# Patient Record
Sex: Male | Born: 1940 | Race: White | Hispanic: No | Marital: Married | Smoking: Former smoker
Health system: Southern US, Community
[De-identification: ages and names within clinical notes are randomized; demographics above are authoritative.]

## PROBLEM LIST (undated history)

## (undated) DIAGNOSIS — K08109 Complete loss of teeth, unspecified cause, unspecified class: Secondary | ICD-10-CM

## (undated) DIAGNOSIS — M5430 Sciatica, unspecified side: Secondary | ICD-10-CM

## (undated) DIAGNOSIS — Z973 Presence of spectacles and contact lenses: Secondary | ICD-10-CM

## (undated) DIAGNOSIS — N400 Enlarged prostate without lower urinary tract symptoms: Secondary | ICD-10-CM

## (undated) DIAGNOSIS — M1990 Unspecified osteoarthritis, unspecified site: Secondary | ICD-10-CM

## (undated) DIAGNOSIS — Z972 Presence of dental prosthetic device (complete) (partial): Secondary | ICD-10-CM

## (undated) DIAGNOSIS — Z87442 Personal history of urinary calculi: Secondary | ICD-10-CM

## (undated) DIAGNOSIS — I10 Essential (primary) hypertension: Secondary | ICD-10-CM

## (undated) DIAGNOSIS — R399 Unspecified symptoms and signs involving the genitourinary system: Secondary | ICD-10-CM

## (undated) DIAGNOSIS — I2510 Atherosclerotic heart disease of native coronary artery without angina pectoris: Secondary | ICD-10-CM

## (undated) DIAGNOSIS — K635 Polyp of colon: Secondary | ICD-10-CM

## (undated) DIAGNOSIS — E785 Hyperlipidemia, unspecified: Secondary | ICD-10-CM

## (undated) DIAGNOSIS — Z860100 Personal history of colon polyps, unspecified: Secondary | ICD-10-CM

## (undated) HISTORY — PX: APPENDECTOMY: SHX54

## (undated) HISTORY — DX: Unspecified osteoarthritis, unspecified site: M19.90

## (undated) HISTORY — PX: CHOLECYSTECTOMY: SHX55

## (undated) HISTORY — DX: Atherosclerotic heart disease of native coronary artery without angina pectoris: I25.10

## (undated) HISTORY — PX: SHOULDER SURGERY: SHX246

## (undated) HISTORY — DX: Sciatica, unspecified side: M54.30

## (undated) HISTORY — DX: Essential (primary) hypertension: I10

## (undated) HISTORY — DX: Hyperlipidemia, unspecified: E78.5

## (undated) HISTORY — PX: LUMBAR SPINE SURGERY: SHX701

## (undated) HISTORY — PX: CARDIOVASCULAR STRESS TEST: SHX262

## (undated) HISTORY — DX: Polyp of colon: K63.5

---

## 1998-02-24 ENCOUNTER — Ambulatory Visit (HOSPITAL_COMMUNITY): Admission: RE | Admit: 1998-02-24 | Discharge: 1998-02-24 | Payer: Self-pay | Admitting: Urology

## 1998-04-01 ENCOUNTER — Ambulatory Visit (HOSPITAL_COMMUNITY): Admission: RE | Admit: 1998-04-01 | Discharge: 1998-04-01 | Payer: Self-pay | Admitting: Family Medicine

## 1999-12-28 ENCOUNTER — Other Ambulatory Visit: Admission: RE | Admit: 1999-12-28 | Discharge: 1999-12-28 | Payer: Self-pay | Admitting: Family Medicine

## 2000-01-02 ENCOUNTER — Ambulatory Visit (HOSPITAL_COMMUNITY): Admission: RE | Admit: 2000-01-02 | Discharge: 2000-01-02 | Payer: Self-pay | Admitting: Family Medicine

## 2000-01-02 ENCOUNTER — Encounter: Payer: Self-pay | Admitting: Family Medicine

## 2002-12-11 ENCOUNTER — Encounter: Admission: RE | Admit: 2002-12-11 | Discharge: 2002-12-11 | Payer: Self-pay | Admitting: Family Medicine

## 2002-12-11 ENCOUNTER — Encounter: Payer: Self-pay | Admitting: Family Medicine

## 2002-12-22 ENCOUNTER — Encounter: Payer: Self-pay | Admitting: Neurosurgery

## 2002-12-22 ENCOUNTER — Inpatient Hospital Stay (HOSPITAL_COMMUNITY): Admission: RE | Admit: 2002-12-22 | Discharge: 2002-12-24 | Payer: Self-pay | Admitting: Neurosurgery

## 2003-07-16 ENCOUNTER — Encounter: Admission: RE | Admit: 2003-07-16 | Discharge: 2003-07-16 | Payer: Self-pay | Admitting: Family Medicine

## 2003-07-16 ENCOUNTER — Encounter: Payer: Self-pay | Admitting: Family Medicine

## 2003-12-15 ENCOUNTER — Ambulatory Visit (HOSPITAL_COMMUNITY): Admission: RE | Admit: 2003-12-15 | Discharge: 2003-12-15 | Payer: Self-pay | Admitting: Otolaryngology

## 2004-09-20 ENCOUNTER — Encounter (INDEPENDENT_AMBULATORY_CARE_PROVIDER_SITE_OTHER): Payer: Self-pay | Admitting: *Deleted

## 2004-09-20 ENCOUNTER — Ambulatory Visit (HOSPITAL_COMMUNITY): Admission: RE | Admit: 2004-09-20 | Discharge: 2004-09-20 | Payer: Self-pay | Admitting: *Deleted

## 2006-10-22 ENCOUNTER — Encounter: Admission: RE | Admit: 2006-10-22 | Discharge: 2007-01-20 | Payer: Self-pay | Admitting: Family Medicine

## 2007-02-19 ENCOUNTER — Encounter (INDEPENDENT_AMBULATORY_CARE_PROVIDER_SITE_OTHER): Payer: Self-pay | Admitting: Specialist

## 2007-02-19 ENCOUNTER — Ambulatory Visit (HOSPITAL_COMMUNITY): Admission: RE | Admit: 2007-02-19 | Discharge: 2007-02-19 | Payer: Self-pay | Admitting: Surgery

## 2007-09-29 ENCOUNTER — Encounter: Admission: RE | Admit: 2007-09-29 | Discharge: 2007-10-22 | Payer: Self-pay | Admitting: Family Medicine

## 2007-10-23 ENCOUNTER — Encounter: Admission: RE | Admit: 2007-10-23 | Discharge: 2007-11-20 | Payer: Self-pay | Admitting: Family Medicine

## 2009-03-27 ENCOUNTER — Encounter: Admission: RE | Admit: 2009-03-27 | Discharge: 2009-03-27 | Payer: Self-pay | Admitting: Family Medicine

## 2009-04-11 ENCOUNTER — Encounter: Admission: RE | Admit: 2009-04-11 | Discharge: 2009-05-12 | Payer: Self-pay | Admitting: Family Medicine

## 2009-05-24 ENCOUNTER — Inpatient Hospital Stay (HOSPITAL_COMMUNITY): Admission: RE | Admit: 2009-05-24 | Discharge: 2009-05-27 | Payer: Self-pay | Admitting: Neurosurgery

## 2009-05-24 HISTORY — PX: ANTERIOR CERVICAL DECOMP/DISCECTOMY FUSION: SHX1161

## 2009-11-07 ENCOUNTER — Encounter: Admission: RE | Admit: 2009-11-07 | Discharge: 2010-02-05 | Payer: Self-pay | Admitting: Orthopedic Surgery

## 2011-01-28 LAB — CBC
MCHC: 34.7 g/dL (ref 30.0–36.0)
MCV: 88.5 fL (ref 78.0–100.0)
Platelets: 194 10*3/uL (ref 150–400)
RBC: 5.44 MIL/uL (ref 4.22–5.81)
RDW: 13.5 % (ref 11.5–15.5)

## 2011-03-06 NOTE — Discharge Summary (Signed)
[name], William [name] NO.:  [id number]   MEDICAL RECORD NO.:  [id number]          PATIENT TYPE:  INP   LOCATION:  3004                         FACILITY:  [facility]   PHYSICIAN:  Hilda Lias, M.D.   DATE OF BIRTH:  25-Mar-1941   DATE OF ADMISSION:  05/24/2009  DATE OF DISCHARGE:  05/27/2009                               DISCHARGE SUMMARY   ADMISSION DIAGNOSIS:  Cervical stenosis with chronic radiculopathy C3-  C7.   FINAL DIAGNOSIS:  Cervical stenosis with chronic radiculopathy C3-C7.   CLINICAL HISTORY:  The patient was admitted because of pain and weakness  in both upper extremities.  X-ray shows severe stenosis from C3-C7.  Surgery was advised.  Laboratory normal.   COURSE IN THE HOSPITAL:  The patient was taken to surgery on August 3,  20l0, and decompression and fusion from C3-C7 was done.  Today, he is  doing great.  He is walking have no complaint.  He is being discharged  today.   CONDITION ON DISCHARGE:  Stable.   MEDICATION:  Percocet and diazepam.   DIET:  Regular.   ACTIVITY:  Not to drive for at least a week.   FOLLOWUP:  To be seen by me in 4 weeks.           ______________________________  Hilda Lias, M.D.     EB/MEDQ  D:  05/27/2009  T:  05/27/2009  Job:  [id number]

## 2011-03-06 NOTE — Op Note (Signed)
NAMENAYDEN, CZAJKA NO.:  [id number]   MEDICAL RECORD NO.:  [id number]          PATIENT TYPE:  INP   LOCATION:  3004                         FACILITY:  [facility]   PHYSICIAN:  Hilda Lias, M.D.   DATE OF BIRTH:  1941/07/05   DATE OF PROCEDURE:  05/24/2009  DATE OF DISCHARGE:                               OPERATIVE REPORT   PREOPERATIVE DIAGNOSIS:  C3-4, C4-5, C5-6 and C6-7 cervical stenosis  with chronic radiculopathy.   POSTOPERATIVE DIAGNOSIS:  C3-4, C4-5, C5-6 and C6-7 cervical stenosis  with chronic radiculopathy.   PROCEDURE:  Anterior C3-4, C4-5, C5-6 and C6-7 diskectomy interbody  fusion with auto and allograft plate, microscope.   SURGEON:  Hilda Lias, MD   ASSISTANT:  Cristi Loron, MD   CLINICAL HISTORY:  Mr. Burkett is a gentleman who is being admitted  because of chronic neck pain worse to both upper extremity, the left  worse than the right one.  By x-ray he has severe stenosis all the way  from C3 down to C7.  He also has a history of bursitis of shoulder.  Surgery was advised.   PROCEDURE:  The patient was taken to the OR and after intubation the  left side of the neck was cleaned with DuraPrep.  Longitudinal incision  through the skin, platysma was carried out.  We did our first x-ray  which showed that indeed we were at the level C4-5.  We started our  dissection at the level C4-5 first, the reason is because there was  almost no space whatsoever.  We had to drill the anterior ligament  because it was calcified and we worked our way using micro curettes.  Dissection was carried all the way down using mostly a drill until we  were able to reach the posterior ligament.  The posterior ligament was  opened and decompression of the spinal cord as well as C5 nerve root was  done.  At the level C4, we had the same finding but there was some kind  of soft disk.  Diskectomy was accomplished with good decompression of  the cord and the C4  nerve root.  At the level C5-6, the patient had  quite a bit of degenerative disk disease.  The diskectomy was  accomplished with decompression of the spinal cord as well as the canal.  At the level C6-7 most of the tissue was herniated disk with a posterior  calcification.  After removal of the disk, the calcified posterior  ligament was also excised and then with the Kerrison punch decompression  of the spinal cord as well as the C7 nerve root was achieved.  At the  end, we had good decompression with some lordosis almost back to normal  position.  Then at the level C3-4 and C4-5 we introduced allograft of 6  mm with autograft inside.  At the level C5-6, C6-7 the allograft graft  was of 7 mm and also lordotic.  This was followed by a plate from Z6-X0  using pin screws.  Lateral cervical spine showed good position of the  plate and the  crus mostly at the level C3-4 and C4-5, we were unable to  see below.  Although we achieved good hemostasis nevertheless, we left  our drain behind.  The wound was closed with Vicryl and Steri-Strips.           ______________________________  Hilda Lias, M.D.    EB/MEDQ  D:  05/24/2009  T:  05/25/2009  Job:  [id number]

## 2011-03-09 NOTE — H&P (Signed)
NAME:  William [name], REASON NO.:  [id number]   MEDICAL RECORD NO.:  [id number]                   PATIENT TYPE:  INP   LOCATION:  2899                                 FACILITY:  [facility]   PHYSICIAN:  Hilda Lias, M.D.                DATE OF BIRTH:  Mar 20, 1941   DATE OF ADMISSION:  12/22/2002  DATE OF DISCHARGE:                                HISTORY & PHYSICAL   HISTORY OF PRESENT ILLNESS:  William [name] is a gentleman who was seen by me in  my office around a week ago because of sudden onset of back pain radiating  down to the groin area.  The pain was going into the groin and not below  that.  At the beginning his physician felt that he might have a kidney stone  and a CT scan was negative.  From then on, he had an MRI and because of the  findings, he was seen by [place]. We saw him. We continued with the conservative  treatment, but he came yesterday to the emergency room and it was worse.  Because of that, he is being admitted today for surgery.  He denies any pain  in the left leg.  Initially, patient came to my office and he was limping on  the right leg.   PAST MEDICAL HISTORY:  Negative.   REVIEW OF SYSTEMS:  Possible back and right thigh pain.   ALLERGIES:  Not allergic to any medications.   SOCIAL HISTORY:  Negative.   FAMILY HISTORY:  Negative.   PHYSICAL EXAMINATION:  HEENT:  Normal.  NECK:  Normal.  LUNGS:  Clear.  HEART:  Heart sounds normal, no murmurs. Good pedal pulses.  NEUROLOGIC:  Mental status normal.  Cranial nerves normal.  Strength in the  right leg at ilial psoas and the abduction and adduction.  He has normal  strength in the right foot.  Sensation is normal.  Reflexes symmetrical with  the right knee jerk which is present but decreased in relation to the left  one.   The MRI showed indeed that he has a herniated disk at the level of T12-L1.  This with displacement of the thecal sac as well as the L1 root.  There is  present  right L1 radiculopathy secondary to __________ circulation with the  patient being admitted for surgery.   PROCEDURE:  The procedure will be T12-L1 diskectomy and we are going to be  using far lateral lamina approach or facetectomy and/or a transpedicle  approach.  We are going to be using the C-arm during the procedure to  visualize the area and make more space lateral to avoid retracting the  spinal cord.   I talked to the patient, discussed this at length and showed him the spinal  cord model and I told him that this type of surgery shall be deferred from  the normal routine lumbar diskectomy.  The cord was medialized right there  while  the complication of the surgery would be weakness of the bladder or bowel  which may be temporary or may be permanent.  Also, the risks of the surgery  itself such as infection, CSF leak, and need for surgery includes fusion.  The patient declined another opinion.                                                Hilda Lias, M.D.    EB/MEDQ  D:  12/22/2002  T:  12/22/2002  Job:  [id number]

## 2011-11-15 ENCOUNTER — Other Ambulatory Visit: Payer: Self-pay | Admitting: Family Medicine

## 2012-12-31 ENCOUNTER — Observation Stay (HOSPITAL_COMMUNITY)
Admission: EM | Admit: 2012-12-31 | Discharge: 2013-01-02 | Disposition: A | Discharge status: Home / Self Care | Payer: Medicare Other | Attending: Interventional Cardiology | Admitting: Interventional Cardiology

## 2012-12-31 ENCOUNTER — Emergency Department (HOSPITAL_COMMUNITY): Payer: Medicare Other

## 2012-12-31 ENCOUNTER — Inpatient Hospital Stay (HOSPITAL_COMMUNITY): Admission: AD | Admit: 2012-12-31 | Payer: Self-pay | Source: Ambulatory Visit | Admitting: Interventional Cardiology

## 2012-12-31 ENCOUNTER — Encounter (HOSPITAL_COMMUNITY): Payer: Self-pay | Admitting: *Deleted

## 2012-12-31 DIAGNOSIS — R0609 Other forms of dyspnea: Secondary | ICD-10-CM | POA: Insufficient documentation

## 2012-12-31 DIAGNOSIS — I2510 Atherosclerotic heart disease of native coronary artery without angina pectoris: Principal | ICD-10-CM | POA: Insufficient documentation

## 2012-12-31 DIAGNOSIS — R030 Elevated blood-pressure reading, without diagnosis of hypertension: Secondary | ICD-10-CM | POA: Insufficient documentation

## 2012-12-31 DIAGNOSIS — I200 Unstable angina: Secondary | ICD-10-CM | POA: Insufficient documentation

## 2012-12-31 DIAGNOSIS — E785 Hyperlipidemia, unspecified: Secondary | ICD-10-CM | POA: Insufficient documentation

## 2012-12-31 DIAGNOSIS — R0989 Other specified symptoms and signs involving the circulatory and respiratory systems: Secondary | ICD-10-CM | POA: Insufficient documentation

## 2012-12-31 LAB — HEMOGLOBIN A1C
Hgb A1c MFr Bld: 5.9 % — ABNORMAL HIGH (ref <5.7)
Mean Plasma Glucose: 123 mg/dL — ABNORMAL HIGH (ref <117)

## 2012-12-31 LAB — CBC WITH DIFFERENTIAL/PLATELET
Basophils Absolute: 0.1 10*3/uL (ref 0.0–0.1)
Eosinophils Absolute: 0.1 10*3/uL (ref 0.0–0.7)
Eosinophils Relative: 2 % (ref 0–5)
HCT: 43.9 % (ref 39.0–52.0)
MCH: 30.4 pg (ref 26.0–34.0)
MCHC: 34.2 g/dL (ref 30.0–36.0)
MCV: 88.9 fL (ref 78.0–100.0)
Monocytes Absolute: 0.5 10*3/uL (ref 0.1–1.0)
Platelets: 152 10*3/uL (ref 150–400)
RDW: 13.3 % (ref 11.5–15.5)

## 2012-12-31 LAB — MRSA PCR SCREENING: MRSA by PCR: NEGATIVE

## 2012-12-31 LAB — COMPREHENSIVE METABOLIC PANEL
ALT: 26 U/L (ref 0–53)
AST: 24 U/L (ref 0–37)
CO2: 24 mEq/L (ref 19–32)
Calcium: 8.9 mg/dL (ref 8.4–10.5)
Creatinine, Ser: 1.12 mg/dL (ref 0.50–1.35)
GFR calc Af Amer: 74 mL/min — ABNORMAL LOW (ref >90)
GFR calc non Af Amer: 64 mL/min — ABNORMAL LOW (ref >90)
Sodium: 141 mEq/L (ref 135–145)
Total Protein: 6.8 g/dL (ref 6.0–8.3)

## 2012-12-31 LAB — POCT I-STAT, CHEM 8
BUN: 20 mg/dL (ref 6–23)
Chloride: 110 mEq/L (ref 96–112)
Creatinine, Ser: 1 mg/dL (ref 0.50–1.35)
Glucose, Bld: 108 mg/dL — ABNORMAL HIGH (ref 70–99)
Hemoglobin: 15.3 g/dL (ref 13.0–17.0)
Potassium: 4 mEq/L (ref 3.5–5.1)
Sodium: 142 mEq/L (ref 135–145)

## 2012-12-31 LAB — CBC
MCHC: 34.7 g/dL (ref 30.0–36.0)
Platelets: 160 10*3/uL (ref 150–400)
RDW: 13.3 % (ref 11.5–15.5)
WBC: 7.1 10*3/uL (ref 4.0–10.5)

## 2012-12-31 LAB — PROTIME-INR: INR: 1 (ref 0.00–1.49)

## 2012-12-31 LAB — POCT I-STAT TROPONIN I

## 2012-12-31 LAB — TROPONIN I: Troponin I: <0.3 ng/mL (ref <0.30)

## 2012-12-31 LAB — APTT: aPTT: 35 seconds (ref 24–37)

## 2012-12-31 MED ORDER — ATORVASTATIN CALCIUM 80 MG PO TABS
80.0000 mg | ORAL_TABLET | Freq: Every day | ORAL | Status: DC
  Administered 2012-12-31: 80 mg via ORAL
  Filled 2012-12-31 (×2): qty 1

## 2012-12-31 MED ORDER — SODIUM CHLORIDE 0.9 % IJ SOLN
3.0000 mL | Freq: Two times a day (BID) | INTRAMUSCULAR | Status: DC
  Administered 2012-12-31: 3 mL via INTRAVENOUS

## 2012-12-31 MED ORDER — HEPARIN BOLUS VIA INFUSION
4000.0000 [IU] | Freq: Once | INTRAVENOUS | Status: AC
  Administered 2012-12-31: 4000 [IU] via INTRAVENOUS

## 2012-12-31 MED ORDER — NITROGLYCERIN IN D5W 200-5 MCG/ML-% IV SOLN
10.0000 ug/min | INTRAVENOUS | Status: DC
  Administered 2012-12-31: 10 ug/min via INTRAVENOUS
  Filled 2012-12-31: qty 250

## 2012-12-31 MED ORDER — METOPROLOL TARTRATE 25 MG PO TABS
25.0000 mg | ORAL_TABLET | Freq: Two times a day (BID) | ORAL | Status: DC
  Administered 2012-12-31: 25 mg via ORAL
  Filled 2012-12-31 (×3): qty 1

## 2012-12-31 MED ORDER — ASPIRIN 300 MG RE SUPP
300.0000 mg | RECTAL | Status: AC
  Filled 2012-12-31: qty 1

## 2012-12-31 MED ORDER — SODIUM CHLORIDE 0.9 % IV SOLN: 250.0000 mL | INTRAVENOUS | Status: DC | PRN

## 2012-12-31 MED ORDER — ASPIRIN 81 MG PO CHEW
324.0000 mg | CHEWABLE_TABLET | ORAL | Status: AC
  Administered 2013-01-01: 324 mg via ORAL
  Filled 2012-12-31: qty 4

## 2012-12-31 MED ORDER — ONDANSETRON HCL 4 MG/2ML IJ SOLN: 4.0000 mg | Freq: Four times a day (QID) | INTRAMUSCULAR | Status: DC | PRN

## 2012-12-31 MED ORDER — DIAZEPAM 5 MG PO TABS
5.0000 mg | ORAL_TABLET | ORAL | Status: AC
  Administered 2013-01-01: 5 mg via ORAL
  Filled 2012-12-31: qty 1

## 2012-12-31 MED ORDER — NITROGLYCERIN 0.4 MG SL SUBL: 0.4000 mg | SUBLINGUAL_TABLET | SUBLINGUAL | Status: DC | PRN

## 2012-12-31 MED ORDER — ACETAMINOPHEN 325 MG PO TABS
650.0000 mg | ORAL_TABLET | ORAL | Status: DC | PRN
  Administered 2012-12-31: 650 mg via ORAL
  Filled 2012-12-31: qty 2

## 2012-12-31 MED ORDER — SODIUM CHLORIDE 0.9 % IJ SOLN: 3.0000 mL | INTRAMUSCULAR | Status: DC | PRN

## 2012-12-31 MED ORDER — SODIUM CHLORIDE 0.9 % IV SOLN
INTRAVENOUS | Status: DC
  Administered 2012-12-31: 17:00:00 via INTRAVENOUS

## 2012-12-31 MED ORDER — ASPIRIN EC 81 MG PO TBEC
81.0000 mg | DELAYED_RELEASE_TABLET | Freq: Every day | ORAL | Status: DC
  Filled 2012-12-31: qty 1

## 2012-12-31 MED ORDER — SODIUM CHLORIDE 0.9 % IV SOLN: INTRAVENOUS | Status: DC

## 2012-12-31 MED ORDER — HEPARIN (PORCINE) IN NACL 100-0.45 UNIT/ML-% IJ SOLN
1650.0000 [IU]/h | INTRAMUSCULAR | Status: DC
  Administered 2012-12-31: 1200 [IU]/h via INTRAVENOUS
  Filled 2012-12-31 (×3): qty 250

## 2012-12-31 NOTE — Care Management Note (Signed)
    Page 1 of 1   12/31/2012     4:27:50 PM   CARE MANAGEMENT NOTE 12/31/2012  Patient:  AKSEL, BENCOMO   Account Number:  [id number]  Date Initiated:  12/31/2012  Documentation initiated by:  Junius Creamer  Subjective/Objective Assessment:   adm w ch pain     Action/Plan:   lives w wife, pcp dr Donovan Kail   Anticipated DC Date:     Anticipated DC Plan:        DC Planning Services  CM consult      Choice offered to / List presented to:             Status of service:   Medicare Important Message given?   (If response is "NO", the following Medicare IM given date fields will be blank) Date Medicare IM given:   Date Additional Medicare IM given:    Discharge Disposition:    Per UR Regulation:  Reviewed for med. necessity/level of care/duration of stay  If discussed at Long Length of Stay Meetings, dates discussed:    Comments:  3/12 1627 debbie dowell rn,bsn

## 2012-12-31 NOTE — ED Notes (Signed)
Dr Garnette Scheuermann of [facility]  [phone] called and stated he has already placed orders for nitro/heparin and for cath lab.  Dr Anitra Lauth to be notified.

## 2012-12-31 NOTE — H&P (Signed)
HPI:  General:  [name] is a 72 year old gentleman with a 6 week history of exertional chest tightness and dyspnea. Very little physical activity in his yard or walking any distance causes his chest to become tight and he feels as though he is going to give out. He complained to Dr. Tenny Craw about this today and was referred to cardiology for further evaluation. In speaking with the patient, he states that he has had very mild recurrences of this discomfort at rest. Last night was an example of his chest feeling mildly heavy even though he was lying inclined to go to sleep. Because he has continued to have this difficulty he went in to see Dr. Tenny Craw today. Greater than 20 years ago yeah coronary angiography that did not reveal any significant abnormality..       ROS:  CONSTITUTIONAL:  Patient denies chills, fatigue, fever, insomnia, night sweats, and anorexia.  HEENT:  Patient denies change in vision , double vision , loss of smell , loss of hearing , bleeding gums.  CARDIOLOGY:  no Chest tightness. no Claudication. no Cyanosis. no Dyspnea on exertion. no Edema. no Fatigue. no Irregular heart beat. no Murmurs. no Near Syncope. no Orthopnea. no Palpitations. no PND (paroxsymal nocturnal dyspnea). Signs of GI bleeding None. Snoring and/or insomnia None. Syncope no. Transient neurological symptoms None. Visual changes none.  MUSCULOSKELETAL:  Patient denies back pain, carpal tunnel, joint stiffness, joint swelling.  NEUROLOGY:  Patient denies headache, insomnia, confusion, gait abnormality, paralysis, paresthesias, seizures, transient neurologiacal deficits..  PSYCHOLOGY:  Patient denies anxiety, mania, memory loss, nervousness, nightmares .         Medical History: Osteoarthritis, Lumbar disc disease, Hyperlipidemia.        Surgical History: shoulder surgery , back surgery X 2 , appendectomy .        Family History: Father: deceased 61 yrs, dementia. Myocardial infarction in his 43s.Mother:  deceased 63 yrs, lung problemsSister 1: alive 31 yrs, back problems and shoulder problems 1B, 2G 7Gd, 1 Gs, 1Ggs.       Social History:  General:  History of smoking  cigarettes: Former smoker no Smoking.  no Alcohol.  Caffeine: yes.  no Recreational drug use.  Exercise: yes, very active.  Occupation: unemployed, retired.  Marital Status: married.  Children: Boys, 1, girls, 2.        Medications: Taking Flomax 0.4 MG Capsule 1 capsule 30 minutes after the same meal each day Once a day       Allergies: Codeine (for allergy).           Vitals: Pulse sitting 92, BP sitting 155/92.       Examination:  Cardiology Exam:  GENERAL APPEARANCE: pleasant, NAD, comfortable, obese, elderly, male.  HEENT: normal.  CAROTID UPSTROKE: no bruit, upstrokes intact.  JVD: flat.  HEART: regular rate and rhythm, normal S1S2, no rub, or click. S4 gallop.Marland Kitchen  HEART MURMUR: none.  LUNGS: clear to auscultation, no wheezing/rhonchi/rales.  ABDOMEN: soft, non-tender, no hepatomegaly, no masses palpated.  EXTREMITIES: no leg edema.  PERIPHERAL PULSES: 2+, bilateral.  NEUROLOGIC: grossly intact, cranial nerves intact, gait WNL.  MOOD: normal.            Assessment:  1. 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  2. Hyperlipidemia - 272.4, no therapy  3. Elevated blood pressure reading without diagnosis of hypertension - 796.2, Could have hypertension blood pressure may be elevated because of  stress of the current situation        1. Intermediate coronary syndrome  Notes: ADMITTED to the hospital IV nitroglycerin Beta blocker therapy IV heparin Plan coronary angiography with possible PCI and a.m. Procedure risk including stroke, death, myocardial infarction, allergy, limb ischemia, bleeding, among others were discussed in detail with the patient and wife. They've both had previous coronary angiograms and understand  the procedure.       Provider: Verdis Prime, MD  Patient: William [name], William [name] DOB: [birth year] Date: 12/31/2012

## 2012-12-31 NOTE — Progress Notes (Signed)
ANTICOAGULATION CONSULT NOTE - Initial Consult  Pharmacy Consult for heparin Indication: chest pain/ACS  Allergies  Allergen Reactions  . Codeine Other (See Comments)    "deathly sick"    Patient Measurements: Height: 5\' 9"  (175.3 cm) Weight: 228 lb (103.42 kg) IBW/kg (Calculated) : 70.7 Heparin Dosing Weight: 93kg  Vital Signs: Temp: 98.1 F (36.7 C) (03/12 1433) Temp src: Oral (03/12 1433) BP: 122/79 mmHg (03/12 1538) Pulse Rate: 87 (03/12 1538)  Labs:  Recent Labs  12/31/12 1450 12/31/12 1515  HGB 15.5 15.3  HCT 44.7 45.0  PLT 160  --   CREATININE  --  1.00    Estimated Creatinine Clearance: 79.1 ml/min (by C-G formula based on Cr of 1).   Medical History: History reviewed. No pertinent past medical history.  Medications:  Infusions:  . heparin    . heparin    . nitroGLYCERIN 15 mcg/min (12/31/12 1538)    Assessment: 71 yom presented with CP to start IV heparin. Baseline CBC is WNL. He was not on any anticoagulants PTA.  Goal of Therapy:  Heparin level 0.3-0.7 units/ml Monitor platelets by anticoagulation protocol: Yes   Plan:  1. Heparin bolus 4000 units IV x 1 2. Heparin gtt 1200 units/hr 3. Check an 8 hour heparin level 4. Daily heparin level and CBC  Rumbarger, Drake Leach 12/31/2012,3:44 PM

## 2012-12-31 NOTE — ED Notes (Signed)
Intermittent chest pain x 3 weeks which increased the past 2 days.  Went to see pcp and became diaphoretic, nauseated and dizzy.  Per EMS no ST elevations.  2 nitro given with relief from 8/10 to 4/10.  324 mg asa given.  AO x 4.

## 2013-01-01 ENCOUNTER — Encounter (HOSPITAL_COMMUNITY)
Admission: EM | Disposition: A | Discharge status: Home / Self Care | Payer: Self-pay | Source: Home / Self Care | Attending: Interventional Cardiology

## 2013-01-01 HISTORY — PX: LEFT HEART CATHETERIZATION WITH CORONARY ANGIOGRAM: SHX5451

## 2013-01-01 LAB — CBC
HCT: 44.2 % (ref 39.0–52.0)
MCH: 30.3 pg (ref 26.0–34.0)
MCV: 86.5 fL (ref 78.0–100.0)
RBC: 5.11 MIL/uL (ref 4.22–5.81)
WBC: 9 10*3/uL (ref 4.0–10.5)

## 2013-01-01 LAB — LIPID PANEL
Cholesterol: 155 mg/dL (ref 0–200)
HDL: 29 mg/dL — ABNORMAL LOW (ref >39)
Total CHOL/HDL Ratio: 5.3 RATIO

## 2013-01-01 SURGERY — LEFT HEART CATHETERIZATION WITH CORONARY ANGIOGRAM
Anesthesia: LOCAL

## 2013-01-01 MED ORDER — HEPARIN SODIUM (PORCINE) 1000 UNIT/ML IJ SOLN
INTRAMUSCULAR | Status: AC
  Filled 2013-01-01: qty 1

## 2013-01-01 MED ORDER — SODIUM CHLORIDE 0.9 % IV SOLN
INTRAVENOUS | Status: AC
  Administered 2013-01-01: 11:00:00 via INTRAVENOUS

## 2013-01-01 MED ORDER — ATORVASTATIN CALCIUM 20 MG PO TABS
20.0000 mg | ORAL_TABLET | Freq: Every day | ORAL | Status: DC
  Administered 2013-01-01: 20:00:00 20 mg via ORAL
  Filled 2013-01-01 (×2): qty 1

## 2013-01-01 MED ORDER — NITROGLYCERIN 0.4 MG SL SUBL: 0.4000 mg | SUBLINGUAL_TABLET | SUBLINGUAL | Status: DC | PRN

## 2013-01-01 MED ORDER — FENTANYL CITRATE 0.05 MG/ML IJ SOLN
INTRAMUSCULAR | Status: AC
  Filled 2013-01-01: qty 2

## 2013-01-01 MED ORDER — HEPARIN BOLUS VIA INFUSION
2000.0000 [IU] | Freq: Once | INTRAVENOUS | Status: AC
  Administered 2013-01-01: 2000 [IU] via INTRAVENOUS
  Filled 2013-01-01: qty 2000

## 2013-01-01 MED ORDER — CLOPIDOGREL BISULFATE 75 MG PO TABS: 75.0000 mg | ORAL_TABLET | Freq: Every day | ORAL | Status: DC

## 2013-01-01 MED ORDER — VERAPAMIL HCL 2.5 MG/ML IV SOLN
INTRAVENOUS | Status: AC
  Filled 2013-01-01: qty 2

## 2013-01-01 MED ORDER — METOPROLOL TARTRATE 50 MG PO TABS
50.0000 mg | ORAL_TABLET | Freq: Two times a day (BID) | ORAL | Status: DC
  Administered 2013-01-01 – 2013-01-02 (×2): 50 mg via ORAL
  Filled 2013-01-01 (×3): qty 1

## 2013-01-01 MED ORDER — METOPROLOL TARTRATE 50 MG PO TABS: 50.0000 mg | ORAL_TABLET | Freq: Two times a day (BID) | ORAL | Status: DC

## 2013-01-01 MED ORDER — MIDAZOLAM HCL 2 MG/2ML IJ SOLN
INTRAMUSCULAR | Status: AC
  Filled 2013-01-01: qty 2

## 2013-01-01 MED ORDER — ATORVASTATIN CALCIUM 20 MG PO TABS: 20.0000 mg | ORAL_TABLET | Freq: Every day | ORAL | Status: DC

## 2013-01-01 MED ORDER — HEPARIN (PORCINE) IN NACL 2-0.9 UNIT/ML-% IJ SOLN
INTRAMUSCULAR | Status: AC
  Filled 2013-01-01: qty 1000

## 2013-01-01 MED ORDER — TAMSULOSIN HCL 0.4 MG PO CAPS: 0.4000 mg | ORAL_CAPSULE | Freq: Every day | ORAL | Status: DC

## 2013-01-01 MED ORDER — TAMSULOSIN HCL 0.4 MG PO CAPS
0.4000 mg | ORAL_CAPSULE | Freq: Every day | ORAL | Status: DC
  Filled 2013-01-01 (×2): qty 1

## 2013-01-01 MED ORDER — ASPIRIN 81 MG PO CHEW: 81.0000 mg | CHEWABLE_TABLET | Freq: Every day | ORAL | Status: DC

## 2013-01-01 MED ORDER — NITROGLYCERIN 1 MG/10 ML FOR IR/CATH LAB
INTRA_ARTERIAL | Status: AC
  Filled 2013-01-01: qty 10

## 2013-01-01 MED ORDER — ASPIRIN 81 MG PO CHEW
81.0000 mg | CHEWABLE_TABLET | Freq: Every day | ORAL | Status: DC
  Administered 2013-01-02: 10:00:00 81 mg via ORAL
  Filled 2013-01-01: qty 1

## 2013-01-01 MED ORDER — LIDOCAINE HCL (PF) 1 % IJ SOLN
INTRAMUSCULAR | Status: AC
  Filled 2013-01-01: qty 30

## 2013-01-01 MED ORDER — CLOPIDOGREL BISULFATE 75 MG PO TABS
75.0000 mg | ORAL_TABLET | Freq: Every day | ORAL | Status: DC
  Administered 2013-01-02: 10:00:00 75 mg via ORAL
  Filled 2013-01-01: qty 1

## 2013-01-01 NOTE — CV Procedure (Signed)
     Diagnostic Cardiac Catheterization Report  William [name] [facility]  72 y.o.  male 1940/12/14  Procedure Date: 01/01/2013 Referring Physician: Duane Lope, MD Primary Cardiologist: HWBSmith, III, MD   PROCEDURE:  Left heart catheterization with selective coronary angiography, left ventriculogram.  INDICATIONS:  Unstable angina/crescendo angina  The risks, benefits, and details of the procedure were explained to the patient.  The patient verbalized understanding and wanted to proceed.  Informed written consent was obtained.  PROCEDURE TECHNIQUE:  After Xylocaine anesthesia a 5 French sheath was placed in the right radial artery with a single anterior needle wall stick.   Coronary angiography was done using a 5 [place] A2 MP catheter.  Left ventriculography was done using a 5 [place] A2 MP catheter. Slowly used a #4 JR, and easy RCA, and AL-1 5 [place] guide to obtain better shots of the native right coronary. 400 mcg of intracoronary nitroglycerin were administered.   CONTRAST:  Total of 120 cc.  COMPLICATIONS:  None.    HEMODYNAMICS:  Aortic pressure was 98/60 mmHg; LV pressure was 100/9 mmHg; LVEDP 20 mm mercury.  There was no gradient between the left ventricle and aorta.    ANGIOGRAPHIC DATA:   The left main coronary artery is widely patent..  The left anterior descending artery is transapical and widely patent minimal luminal irregularities are noted. Mid vessel systolic compression is noted..  The left circumflex artery is 50% ostial narrowing 50% mid vessel narrowing small first obtuse marginal much larger bifurcating second obtuse marginal. No angiographically significant obstructions noted.  The right coronary artery is slight anterior origin making coaxial intubation difficult from the right radial. Distal vessel contains eccentric 50% narrowing. Proximal PDA, a relatively small to moderate size branch, contains a segmental 50-75% narrowing.  LEFT VENTRICULOGRAM:  Left  ventricular angiogram was done in the 30 RAO projection and revealed normal left ventricular wall motion and systolic function with an estimated ejection fraction of 60 %.  LVEDP was 20 mmHg.  IMPRESSIONS:  1. Intermediate stenosis in the proximal PDA, 50-70%. There is 50% distal RCA stenosis, 50% ostial circumflex and 50% mid circumflex stenosis. Widely patent LAD.  2. Normal LV function  3. Symptoms out of proportion to severe native coronary disease. There may be a vasomotor component.   RECOMMENDATION:  1. Aspirin and Plavix  2. Initiate antianginal therapy in the form of beta blocker, statin, and consider vasodilator therapy with a calcium channel blocker since nitrates cause severe headache.  3. If the patient continues with limiting symptoms he may need to be restudied from the femoral approach and have multiple site FFR.

## 2013-01-01 NOTE — H&P (Signed)
See note

## 2013-01-01 NOTE — Progress Notes (Signed)
ANTICOAGULATION CONSULT NOTE Pharmacy Consult for heparin Indication: chest pain/ACS  Allergies  Allergen Reactions  . Codeine Other (See Comments)    "deathly sick"    Patient Measurements: Height: 5\' 9"  (175.3 cm) Weight: 228 lb (103.42 kg) IBW/kg (Calculated) : 70.7 Heparin Dosing Weight: 93kg  Vital Signs: Temp: 97.6 F (36.4 C) (03/13 0745) Temp src: Oral (03/13 0745) BP: 165/92 mmHg (03/13 0800) Pulse Rate: 80 (03/13 0800)  Labs:  Recent Labs  12/31/12 1450 12/31/12 1451 12/31/12 1515 12/31/12 1723 12/31/12 2045 01/01/13 0002 01/01/13 0309 01/01/13 0825  HGB 15.5  --  15.3 15.0  --   --   --  15.5  HCT 44.7  --  45.0 43.9  --   --   --  44.2  PLT 160  --   --  152  --   --   --  148*  APTT 35  --   --   --   --   --   --   --   LABPROT 13.1  --   --  13.8  --   --   --   --   INR 1.00  --   --  1.07  --   --   --   --   HEPARINUNFRC  --   --   --   --   --  0.11*  --  0.26*  CREATININE  --   --  1.00 1.12  --   --   --   --   TROPONINI  --  <0.30  --   --  <0.30  --  <0.30  --     Estimated Creatinine Clearance: 70.7 ml/min (by C-G formula based on Cr of 1.12).  Assessment: 72 yo male with chest pain on heparin.  HL 0.26 after 2000 units bolus and rate increase to 1500 units/hr.  CBC stable with no bleeding reported.  Cardiac cath planned for today.  HL slightly below goal.    Goal of Therapy:  Heparin level 0.3-0.7 units/ml Monitor platelets by anticoagulation protocol: Yes   Plan:  1. Increase heparin drip to 1650 units/hr and check 8 hr HL or follow up after cath  Herby Abraham, Pharm.D. [phone] 01/01/2013 9:29 AM

## 2013-01-01 NOTE — Discharge Summary (Signed)
Patient ID: William [name] [facility] MRN: [id number] DOB/AGE: 73/02/1941 73 y.o.  Admit date: 12/31/2012 Discharge date: 01/01/2013  Primary Discharge Diagnosis: Crescendo angina pectoris Secondary Discharge Diagnosis: Hyperlipidemia  Hypertension  Significant Diagnostic Studies: Coronary angiography 01/01/13  Consults: None  Hospital Course: This patient underwent office evaluation on 12/31/2012. While in the office he complained of severe high initial plan was to perform stress testing. Because he was having pain at rest he was admitted to the hospital with the diagnoses of accelerating angina. IV nitroglycerin and heparin relieved the patient's chest discomfort across severe headache. After 12 hours the patient's markers were negative. No EKG changes were noted. He underwent diagnostic catheterization from the right radial approach demonstrating an 80-90% PDA stenosis, 50% distal RCA, and 50% mid circumflex. My clinical assessment was that we should attempt to treat the patient medically before performing coronary intervention. Therefore, beta blocker therapy, statin therapy, and dual antiplatelet therapy was started. The patient ambulated post procedure and the morning following the procedure without symptoms. He is discharged will be followed up as an outpatient. Consideration of percutaneous intervention will be entertained only if symptoms not controlled on medical therapy.   Discharge Exam: Blood pressure 132/90, pulse 74, temperature 97.6 F (36.4 C), temperature source Oral, resp. rate 18, height 5\' 9"  (1.753 m), weight 103.42 kg (228 lb), SpO2 98.00%.    Chest exam is clear  Cardiac exam is unremarkable.  Right radial cath site is unremarkable. Labs:   Lab Results  Component Value Date   WBC 9.0 01/01/2013   HGB 15.5 01/01/2013   HCT 44.2 01/01/2013   MCV 86.5 01/01/2013   PLT 148* 01/01/2013    Recent Labs Lab 12/31/12 1723  NA 141  K 3.8  CL 107  CO2 24  BUN 19    CREATININE 1.12  CALCIUM 8.9  PROT 6.8  BILITOT 0.5  ALKPHOS 61  ALT 26  AST 24  GLUCOSE 156*   Lab Results  Component Value Date   TROPONINI <0.30 01/01/2013    Lab Results  Component Value Date   CHOL 155 01/01/2013   Lab Results  Component Value Date   HDL 29* 01/01/2013   Lab Results  Component Value Date   LDLCALC 99 01/01/2013   Lab Results  Component Value Date   TRIG 135 01/01/2013   Lab Results  Component Value Date   CHOLHDL 5.3 01/01/2013   No results found for this basename: LDLDIRECT      Radiology: IMPRESSION:  1. Low inspiratory volumes with mild bibasilar atelectasis.  Otherwise, no acute cardiopulmonary process.  2. Stable lingular atelectasis versus scarring  3. Mild central bronchitic changes  EKG: Normal sinus rhythm with left anterior hemiblock   FOLLOW UP PLANS AND APPOINTMENTS    Medication List    TAKE these medications       aspirin 81 MG chewable tablet  Chew 1 tablet (81 mg total) by mouth daily.     atorvastatin 20 MG tablet  Commonly known as:  LIPITOR  Take 1 tablet (20 mg total) by mouth daily at 6 PM.     clopidogrel 75 MG tablet  Commonly known as:  PLAVIX  Take 1 tablet (75 mg total) by mouth daily with breakfast.  Start taking on:  01/02/2013     metoprolol 50 MG tablet  Commonly known as:  LOPRESSOR  Take 1 tablet (50 mg total) by mouth 2 (two) times daily.     nitroGLYCERIN 0.4 MG  SL tablet  Commonly known as:  NITROSTAT  Place 1 tablet (0.4 mg total) under the tongue every 5 (five) minutes as needed for chest pain.     tamsulosin 0.4 MG Caps  Commonly known as:  FLOMAX  Take 0.4 mg by mouth daily.           Follow-up Information   Follow up with Lesleigh Noe, MD In 1 week. (Office will call with appointment)    Contact information:   [address] [place] [address] [phone]       BRING ALL MEDICATIONS WITH YOU TO FOLLOW UP APPOINTMENTS  Time spent with patient  to include physician time: 20 minutes  Signed: Lesleigh Noe 01/01/2013, 8:02 PM

## 2013-01-01 NOTE — Progress Notes (Signed)
ANTICOAGULATION CONSULT NOTE Pharmacy Consult for heparin Indication: chest pain/ACS  Allergies  Allergen Reactions  . Codeine Other (See Comments)    "deathly sick"    Patient Measurements: Height: 5\' 9"  (175.3 cm) Weight: 228 lb (103.42 kg) IBW/kg (Calculated) : 70.7 Heparin Dosing Weight: 93kg  Vital Signs: Temp: 97.8 F (36.6 C) (03/13 0005) Temp src: Oral (03/13 0005) BP: 122/62 mmHg (03/12 2300) Pulse Rate: 79 (03/12 2300)  Labs:  Recent Labs  12/31/12 1450 12/31/12 1451 12/31/12 1515 12/31/12 1723 12/31/12 2045 01/01/13 0002  HGB 15.5  --  15.3 15.0  --   --   HCT 44.7  --  45.0 43.9  --   --   PLT 160  --   --  152  --   --   APTT 35  --   --   --   --   --   LABPROT 13.1  --   --  13.8  --   --   INR 1.00  --   --  1.07  --   --   HEPARINUNFRC  --   --   --   --   --  0.11*  CREATININE  --   --  1.00 1.12  --   --   TROPONINI  --  <0.30  --   --  <0.30  --     Estimated Creatinine Clearance: 70.7 ml/min (by C-G formula based on Cr of 1.12).  Assessment: 72 yo male with chest pain for heparin  Goal of Therapy:  Heparin level 0.3-0.7 units/ml Monitor platelets by anticoagulation protocol: Yes   Plan:  Heparin 2000 units IV bolus, then increase heparin  1500 units/hr Follow-up am labs.  Eddie Candle 01/01/2013,12:36 AM

## 2013-01-01 NOTE — Progress Notes (Signed)
TR BAND REMOVAL  LOCATION:  right radial  DEFLATED PER PROTOCOL:  yes  TIME BAND OFF / DRESSING APPLIED:   1400   SITE UPON ARRIVAL:   Level 0  SITE AFTER BAND REMOVAL:  Level 0  REVERSE ALLEN'S TEST:    positive  CIRCULATION SENSATION AND MOVEMENT:  Within Normal Limits  yes  COMMENTS:     

## 2013-01-01 NOTE — H&P (Signed)
The patient is 72 years of age and has a 4 to six-week history of exertional chest discomfort. There has been progression of symptoms to the point that they have been occurring at rest. After admission yesterday afternoon the starting IV nitroglycerin chest discomfort has not recurred. EKGs are normal and markers are negative for evidence of injury. Laboratory data is unremarkable with a creatinine of 1.12 BUN of 19 potassium of 3.8.  I discussed evaluation with the patient, and recommended coronary angiography with possible PCI. We will do the procedure from the right radial approach. The procedure and the risk of stroke, death, myocardial infarction, emergency surgery, bleeding, allergy, kidney failure, among others were discussed in detail with the patient. Understanding the procedure, its complications, and having all questions answered, the patient has consented to the procedure.

## 2013-01-02 LAB — CBC
HCT: 46.7 % (ref 39.0–52.0)
Hemoglobin: 16.2 g/dL (ref 13.0–17.0)
MCHC: 34.7 g/dL (ref 30.0–36.0)

## 2013-08-20 ENCOUNTER — Encounter: Payer: Self-pay | Admitting: Interventional Cardiology

## 2013-11-06 ENCOUNTER — Ambulatory Visit: Payer: Medicare Other | Admitting: Interventional Cardiology

## 2013-11-16 ENCOUNTER — Encounter: Payer: Self-pay | Admitting: *Deleted

## 2013-11-16 ENCOUNTER — Encounter: Payer: Self-pay | Admitting: Interventional Cardiology

## 2013-11-16 DIAGNOSIS — I208 Other forms of angina pectoris: Secondary | ICD-10-CM | POA: Insufficient documentation

## 2013-11-16 DIAGNOSIS — K635 Polyp of colon: Secondary | ICD-10-CM | POA: Insufficient documentation

## 2013-11-16 DIAGNOSIS — E7800 Pure hypercholesterolemia, unspecified: Secondary | ICD-10-CM | POA: Insufficient documentation

## 2013-11-16 DIAGNOSIS — G629 Polyneuropathy, unspecified: Secondary | ICD-10-CM | POA: Insufficient documentation

## 2013-11-16 DIAGNOSIS — E785 Hyperlipidemia, unspecified: Secondary | ICD-10-CM | POA: Insufficient documentation

## 2013-11-16 DIAGNOSIS — M1990 Unspecified osteoarthritis, unspecified site: Secondary | ICD-10-CM | POA: Insufficient documentation

## 2013-11-16 DIAGNOSIS — M5430 Sciatica, unspecified side: Secondary | ICD-10-CM | POA: Insufficient documentation

## 2013-11-16 DIAGNOSIS — I2510 Atherosclerotic heart disease of native coronary artery without angina pectoris: Secondary | ICD-10-CM | POA: Insufficient documentation

## 2013-11-16 DIAGNOSIS — M549 Dorsalgia, unspecified: Secondary | ICD-10-CM | POA: Insufficient documentation

## 2013-11-16 DIAGNOSIS — I10 Essential (primary) hypertension: Secondary | ICD-10-CM | POA: Insufficient documentation

## 2013-11-23 ENCOUNTER — Encounter: Payer: Self-pay | Admitting: Interventional Cardiology

## 2013-11-23 ENCOUNTER — Ambulatory Visit (INDEPENDENT_AMBULATORY_CARE_PROVIDER_SITE_OTHER): Payer: Medicare Other | Admitting: Interventional Cardiology

## 2013-11-23 VITALS — BP 130/82 | HR 72 | Ht 69.0 in | Wt 231.0 lb

## 2013-11-23 DIAGNOSIS — I10 Essential (primary) hypertension: Secondary | ICD-10-CM

## 2013-11-23 DIAGNOSIS — E785 Hyperlipidemia, unspecified: Secondary | ICD-10-CM

## 2013-11-23 DIAGNOSIS — I2510 Atherosclerotic heart disease of native coronary artery without angina pectoris: Secondary | ICD-10-CM

## 2013-11-23 LAB — ALT: ALT: 24 U/L (ref 0–53)

## 2013-11-23 MED ORDER — ATORVASTATIN CALCIUM 20 MG PO TABS: 20.0000 mg | ORAL_TABLET | Freq: Every day | ORAL | Status: DC

## 2013-11-23 NOTE — Patient Instructions (Signed)
Your physician recommends that you continue on your current medications as directed. Please refer to the Current Medication list given to you today.  A refill for Atorvastatin(Lipitor) has been sent to your pharmacy  Labs Today: lipid, alt  Your physician wants you to follow-up in: 1 year You will receive a reminder letter in the mail two months in advance. If you don't receive a letter, please call our office to schedule the follow-up appointment.

## 2013-11-23 NOTE — Progress Notes (Signed)
Patient ID: William [name] [facility], male   DOB: 21-Jun-1941, 73 y.o.   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, [address],   [address] Phone: [phone] Fax:  [phone]  Date:  11/23/2013   ID:  William [name] [facility], DOB 1941-08-23, MRN [id number]  PCP:   Melinda Crutch, MD   ASSESSMENT:  1. Distal right coronary disease, stable without significant angina 2. Hypertension, under control 3. Hyperlipidemia, less than panel done in April 2014  PLAN:  1. ALT and statin panel in April 2. Refill atorvastatin 3 months per prescription for one year 3. Cautioned that if he becomes limited by angina, he should call and we will contemplate PCI 4. Clinical followup in one year   SUBJECTIVE: William [name] is a 73 y.o. male who is doing well and has not needed nitroglycerin. He is overall experienced fatigue with exertion. No exertional discomfort. He occasionally has tightness in his chest when he lays down. He feels this is indigestion. No palpitations or syncope. No limitations in his physical activity.   Wt Readings from Last 3 Encounters:  11/23/13 231 lb (104.781 kg)  01/02/13 229 lb 4.5 oz (104 kg)  01/02/13 229 lb 4.5 oz (104 kg)     Past Medical History  Diagnosis Date  . Pure hypercholesterolemia   . Osteoarthritis   . Back pain   . Sciatica   . Osteoarthrosis   . Neuropathy   . Angina of effort   . Hyperlipidemia   . HTN (hypertension)   . Osteoarthritis   . CAD (coronary artery disease)     with intermediate obstruction in the distal RCA and PDA by catheter 2013, Dr Tamala Julian  . Colon polyp     Current Outpatient Prescriptions  Medication Sig Dispense Refill  . aspirin 81 MG chewable tablet Chew 1 tablet (81 mg total) by mouth daily.      Marland Kitchen  atorvastatin (LIPITOR) 20 MG tablet Take 1 tablet (20 mg total) by mouth daily at 6 PM.  30 tablet  11  . metoprolol (LOPRESSOR) 50 MG tablet Take 1 tablet (50 mg total) by mouth 2 (two) times daily.  60 tablet  11  . nitroGLYCERIN (NITROSTAT) 0.4 MG SL tablet Place 1 tablet (0.4 mg total) under the tongue every 5 (five) minutes as needed for chest pain.  25 tablet  3  . tamsulosin (FLOMAX) 0.4 MG CAPS Take 0.4 mg by mouth daily.       No current facility-administered medications for this visit.    Allergies:    Allergies  Allergen Reactions  . Codeine Other (See Comments)    "deathly sick"    Social History:  The patient  reports that he has quit smoking. He does not have any smokeless tobacco history on file. He reports that he does not drink alcohol or use illicit drugs.   ROS:  Please see the history of present illness.   No claudication, transient neurological symptoms, palpitations, or syncope   All other systems reviewed and negative.   OBJECTIVE: VS:  BP 130/82  Pulse 72  Ht 5\' 9"  (1.753 m)  Wt 231 lb (104.781 kg)  BMI 34.10 kg/m2 Well nourished, well developed, in no acute distress, obese HEENT:  normal Neck: JVD flat. Carotid bruit 2+ bilateral  Cardiac:  normal S1, S2; RRR; no murmur Lungs:  clear to auscultation bilaterally, no wheezing, rhonchi or rales Abd: soft, nontender, no hepatomegaly Ext: Edema none. Pulses 2+ bilateral Skin: warm and dry Neuro:  CNs 2-12 intact, no focal abnormalities noted  EKG:  Normal       Signed, Illene Labrador III, MD 11/23/2013 9:47 AM

## 2013-11-24 LAB — LIPID PANEL
Cholesterol: 102 mg/dL (ref 0–200)
HDL: 34.6 mg/dL — AB (ref >39.00)
LDL Cholesterol: 44 mg/dL (ref 0–99)
TRIGLYCERIDES: 116 mg/dL (ref 0.0–149.0)
Total CHOL/HDL Ratio: 3
VLDL: 23.2 mg/dL (ref 0.0–40.0)

## 2013-12-01 ENCOUNTER — Telehealth: Payer: Self-pay

## 2013-12-01 DIAGNOSIS — E785 Hyperlipidemia, unspecified: Secondary | ICD-10-CM

## 2013-12-01 MED ORDER — ATORVASTATIN CALCIUM 20 MG PO TABS: 10.0000 mg | ORAL_TABLET | Freq: Every day | ORAL | Status: DC

## 2013-12-01 NOTE — Telephone Encounter (Signed)
pt wife given lab results.and Dr.Smith instructions.Okay to reduce atorvastatin to 10 mg daily and repeat Lipid and Alt 1 year.pt wife verbalized understanding.

## 2013-12-01 NOTE — Telephone Encounter (Signed)
Message copied by Lamar Laundry on Tue Dec 01, 2013  2:31 PM ------      Message from: Daneen Schick      Created: Tue Nov 24, 2013  2:48 PM       Okay to reduce atorvastatin to 10 mg daily and repeat Lipid and Alt 1 year ------

## 2014-02-16 ENCOUNTER — Other Ambulatory Visit (INDEPENDENT_AMBULATORY_CARE_PROVIDER_SITE_OTHER): Payer: Medicare Other

## 2014-02-16 DIAGNOSIS — E785 Hyperlipidemia, unspecified: Secondary | ICD-10-CM

## 2014-02-16 LAB — LIPID PANEL
Cholesterol: 93 mg/dL (ref 0–200)
HDL: 31.7 mg/dL — ABNORMAL LOW (ref >39.00)
LDL CALC: 39 mg/dL (ref 0–99)
Total CHOL/HDL Ratio: 3
Triglycerides: 110 mg/dL (ref 0.0–149.0)
VLDL: 22 mg/dL (ref 0.0–40.0)

## 2014-02-16 LAB — ALT: ALT: 21 U/L (ref 0–53)

## 2014-02-18 ENCOUNTER — Telehealth: Payer: Self-pay

## 2014-02-18 NOTE — Telephone Encounter (Signed)
Message copied by Lamar Laundry on Thu Feb 18, 2014  9:13 AM ------      Message from: Daneen Schick      Created: Tue Feb 16, 2014  2:47 PM       Very very good. Repeat in 12 months ------

## 2014-04-29 ENCOUNTER — Encounter: Payer: Self-pay | Admitting: Interventional Cardiology

## 2014-09-30 ENCOUNTER — Encounter (HOSPITAL_COMMUNITY): Payer: Self-pay | Admitting: Interventional Cardiology

## 2015-02-21 ENCOUNTER — Other Ambulatory Visit (INDEPENDENT_AMBULATORY_CARE_PROVIDER_SITE_OTHER): Payer: Medicare Other | Admitting: *Deleted

## 2015-02-21 DIAGNOSIS — E7800 Pure hypercholesterolemia, unspecified: Secondary | ICD-10-CM

## 2015-02-21 LAB — ALT: ALT: 28 U/L (ref 0–53)

## 2015-02-21 LAB — LIPID PANEL
CHOLESTEROL: 166 mg/dL (ref 0–200)
HDL: 27.8 mg/dL — AB (ref >39.00)
LDL Cholesterol: 107 mg/dL — ABNORMAL HIGH (ref 0–99)
NONHDL: 138.2
Total CHOL/HDL Ratio: 6
Triglycerides: 158 mg/dL — ABNORMAL HIGH (ref 0.0–149.0)
VLDL: 31.6 mg/dL (ref 0.0–40.0)

## 2015-02-21 NOTE — Addendum Note (Signed)
Addended by: Eulis Foster on: 02/21/2015 08:41 AM   Modules accepted: Orders

## 2015-02-25 ENCOUNTER — Other Ambulatory Visit: Payer: Self-pay

## 2015-02-25 DIAGNOSIS — E785 Hyperlipidemia, unspecified: Secondary | ICD-10-CM

## 2015-05-26 ENCOUNTER — Other Ambulatory Visit: Payer: Self-pay | Admitting: Urology

## 2015-06-01 ENCOUNTER — Telehealth: Payer: Self-pay | Admitting: Interventional Cardiology

## 2015-06-01 DIAGNOSIS — Z01818 Encounter for other preprocedural examination: Secondary | ICD-10-CM

## 2015-06-01 DIAGNOSIS — I2510 Atherosclerotic heart disease of native coronary artery without angina pectoris: Secondary | ICD-10-CM

## 2015-06-01 NOTE — Telephone Encounter (Signed)
Cardiac clearance request fwd to Dr.Smith

## 2015-06-01 NOTE — Telephone Encounter (Signed)
Has not been seen in over 1.5 years. Needs stress or pharmacologic Myoview to determine stability for TURP.

## 2015-06-01 NOTE — Telephone Encounter (Signed)
New message      Request for surgical clearance:  What type of surgery is being performed? TURP When is this surgery scheduled? 06-13-15 1. Are there any medications that need to be held prior to surgery and how long? Aspirin and need medical clearance  2. Name of physician performing surgery?  Dr Diona Fanti   3. What is your office phone and fax number? fax [phone]

## 2015-06-02 NOTE — Telephone Encounter (Signed)
Pt aware he will need a lexiscan myoview in order to be cleared for his upcoming sx. Verbal pretest instruction given. Adv pt a scheduler from our office will call him to schedule. Pt verbalized understanding.

## 2015-06-02 NOTE — Telephone Encounter (Signed)
[facility] aware. Pt has not been seen by Dr.Smith in 1.9yr. Pt wil need to have a myoview before cardiac clearance is granted. I will make the pt aware. She verbalized understanding.

## 2015-06-06 ENCOUNTER — Telehealth (HOSPITAL_COMMUNITY): Payer: Self-pay | Admitting: *Deleted

## 2015-06-06 NOTE — Telephone Encounter (Signed)
Patient given detailed instructions per Myocardial Perfusion Study Information Sheet for test on 06/08/15 at 0930. Patient Notified to arrive 15 minutes early, and that it is imperative to arrive on time for appointment to keep from having the test rescheduled. Patient verbalized understanding. Raybon Conard, [name]

## 2015-06-06 NOTE — Telephone Encounter (Signed)
Left message on voicemail in reference to upcoming appointment scheduled for 06/08/15. Phone number given for a call back so details instructions can be given. William [name], [name]

## 2015-06-08 ENCOUNTER — Ambulatory Visit (HOSPITAL_COMMUNITY): Payer: Medicare Other

## 2015-06-21 ENCOUNTER — Telehealth (HOSPITAL_COMMUNITY): Payer: Self-pay

## 2015-06-21 NOTE — Telephone Encounter (Signed)
Patient given detailed instructions per Myocardial Perfusion Study Information Sheet for test on 06-23-2015 at 0900. Patient Notified to arrive 15 minutes early, and that it is imperative to arrive on time for appointment to keep from having the test rescheduled. Patient verbalized understanding. William [name], Zena Vitelli A

## 2015-06-23 ENCOUNTER — Ambulatory Visit (HOSPITAL_COMMUNITY): Payer: Medicare Other | Attending: Cardiology

## 2015-06-23 DIAGNOSIS — I2510 Atherosclerotic heart disease of native coronary artery without angina pectoris: Secondary | ICD-10-CM | POA: Insufficient documentation

## 2015-06-23 DIAGNOSIS — Z01818 Encounter for other preprocedural examination: Secondary | ICD-10-CM | POA: Insufficient documentation

## 2015-06-23 LAB — MYOCARDIAL PERFUSION IMAGING
CHL CUP NUCLEAR SDS: 2
CHL CUP NUCLEAR SRS: 2
CHL CUP RESTING HR STRESS: 58 {beats}/min
LV sys vol: 47 mL
LVDIAVOL: 110 mL
Peak HR: 94 {beats}/min
RATE: 0.3
SSS: 4
TID: 1

## 2015-06-23 MED ORDER — TECHNETIUM TC 99M SESTAMIBI GENERIC - CARDIOLITE
10.6000 | Freq: Once | INTRAVENOUS | Status: AC | PRN
  Administered 2015-06-23: 11 via INTRAVENOUS

## 2015-06-23 MED ORDER — REGADENOSON 0.4 MG/5ML IV SOLN
0.4000 mg | Freq: Once | INTRAVENOUS | Status: AC
  Administered 2015-06-23: 0.4 mg via INTRAVENOUS

## 2015-06-23 MED ORDER — TECHNETIUM TC 99M SESTAMIBI GENERIC - CARDIOLITE
29.6000 | Freq: Once | INTRAVENOUS | Status: AC | PRN
  Administered 2015-06-23: 30 via INTRAVENOUS

## 2015-06-24 NOTE — Telephone Encounter (Signed)
Pt aware pf myoview results. Study is normal and the patient is cleared for the urologic procedure, TURP. Message fwd to Dr.Dahlstedt. Pt appreciative for the call and verbalized understanding

## 2015-06-24 NOTE — Telephone Encounter (Signed)
-----   Message from Belva Crome, MD sent at 06/23/2015  6:29 PM EDT ----- Study is normal and the patient is cleared for the urologic procedure, TURP.

## 2015-06-28 ENCOUNTER — Encounter (HOSPITAL_BASED_OUTPATIENT_CLINIC_OR_DEPARTMENT_OTHER): Payer: Self-pay | Admitting: *Deleted

## 2015-06-28 NOTE — Progress Notes (Signed)
NPO AFTER MN.  ARRIVE AT 0600.  NEEDS ISTAT AND EKG.  WILL TAKE METOPROLOL AND FINASTERIDE AM DOS W/ SIPS OF WATER. REVIEWED [facility] GUIDELINES, WILL BRING MEDS.

## 2015-06-29 ENCOUNTER — Telehealth: Payer: Self-pay | Admitting: Interventional Cardiology

## 2015-06-29 NOTE — Telephone Encounter (Signed)
New message       Request for surgical clearance:  What type of surgery is being performed? TUR of prostate 1. When is this surgery scheduled?  07-04-15  2. Are there any medications that need to be held prior to surgery and how long? Hold aspirin and need medical clearance  3. Name of physician performing surgery? Dr Romilda Garret  4. What is your office phone and fax number?  Fax [phone]

## 2015-06-29 NOTE — Telephone Encounter (Signed)
Message fwd to Dr.Dahlsteadt

## 2015-06-29 NOTE — Telephone Encounter (Signed)
Routed to Dr.Smith to advise pt holding ASA

## 2015-06-29 NOTE — Telephone Encounter (Signed)
Previously cleared for surgery and message sent to Dr. Zannie Cove. Okay to hold aspirin.

## 2015-07-03 NOTE — H&P (Signed)
Urology History and Physical Exam  CC: Difficulty urinating  HPI: 74 year old male presents at this time for elective TURP for management of urinary symptoms not improved with medical therapy.  PMH: Past Medical History  Diagnosis Date  . Sciatica   . HTN (hypertension)   . Hyperlipidemia   . History of colon polyps   . CAD (coronary artery disease) cardiologist-  dr Daneen Schick    with intermediate obstruction in the distal RCA and PDA by catheter 2013, Dr Tamala Julian  . BPH (benign prostatic hyperplasia)   . Lower urinary tract symptoms (LUTS)   . History of kidney stones     1970's  . Osteoarthritis     thumbs  . Wears glasses   . Full dentures     PSH: Past Surgical History  Procedure Laterality Date  . Left heart catheterization with coronary angiogram N/A 01/01/2013    Procedure: LEFT HEART CATHETERIZATION WITH CORONARY ANGIOGRAM;  Surgeon: Sinclair Grooms, MD;  Location: [facility] CATH LAB;  Service: Cardiovascular;  Laterality: N/A;   Intermediate stenosis in  proximal PDA 50-70%;  dRCA 50%;  ostial and mid CFX 50%;  widely patent LAD;  normal LVF, ef 60%  . Anterior cervical decomp/discectomy fusion  05-24-2009    C3 -- C7  . Cardiovascular stress test  06-23-2015  dr Daneen Schick    normal lexiscan study/  no perfusion defects or ischemia/  normal LV function and wall motion , ef 57%  . Lumbar spine surgery  1990's  . Appendectomy  age 60    Allergies: Allergies  Allergen Reactions  . Codeine Nausea And Vomiting and Other (See Comments)    "deathly sick"    Medications: No prescriptions prior to admission     Social History: Social History   Social History  . Marital Status: Married    Spouse Name: N/A  . Number of Children: N/A  . Years of Education: N/A   Occupational History  . Not on file.   Social History Main Topics  . Smoking status: Former Smoker -- 12 years    Types: Cigarettes    Quit date: 06/27/1964  . Smokeless tobacco: Never Used  .  Alcohol Use: No  . Drug Use: No  . Sexual Activity: Not on file   Other Topics Concern  . Not on file   Social History Narrative    Family History: Family History  Problem Relation Age of Onset  . Dementia Father   . Heart attack      Review of Systems: Positive: Slow stream, hesitancy, intermittency Negative:   A further 10 point review of systems was negative except what is listed in the HPI.                  Physical Exam: @VITALS2 @ General: No acute distress.  Awake. Head:  Normocephalic.  Atraumatic. ENT:  EOMI.  Mucous membranes moist Neck:  Supple.  No lymphadenopathy. CV:  S1 present. S2 present. Regular rate. Pulmonary: Equal effort bilaterally.  Clear to auscultation bilaterally. Abdomen: Soft.  Non tender to palpation. Skin:  Normal turgor.  No visible rash. Extremity: No gross deformity of bilateral upper extremities.  No gross deformity of                             lower extremities. Neurologic: Alert. Appropriate mood.    Studies:  No results for input(s): HGB, WBC, PLT in the  last 72 hours.  No results for input(s): NA, K, CL, CO2, BUN, CREATININE, CALCIUM, GFRNONAA, GFRAA in the last 72 hours.  Invalid input(s): MAGNESIUM   No results for input(s): INR, APTT in the last 72 hours.  Invalid input(s): PT   Invalid input(s): ABG    Assessment:  BPH with obstructive symptoms  Plan: TURP

## 2015-07-04 ENCOUNTER — Encounter (HOSPITAL_BASED_OUTPATIENT_CLINIC_OR_DEPARTMENT_OTHER): Payer: Self-pay

## 2015-07-04 ENCOUNTER — Encounter (HOSPITAL_COMMUNITY)
Admission: RE | Disposition: A | Discharge status: Home / Self Care | Payer: Self-pay | Source: Ambulatory Visit | Attending: Urology

## 2015-07-04 ENCOUNTER — Ambulatory Visit (HOSPITAL_BASED_OUTPATIENT_CLINIC_OR_DEPARTMENT_OTHER): Payer: Medicare Other | Admitting: Anesthesiology

## 2015-07-04 ENCOUNTER — Ambulatory Visit (HOSPITAL_BASED_OUTPATIENT_CLINIC_OR_DEPARTMENT_OTHER)
Admission: RE | Admit: 2015-07-04 | Discharge: 2015-07-05 | Disposition: A | Discharge status: Home / Self Care | Payer: Medicare Other | Source: Ambulatory Visit | Attending: Urology | Admitting: Urology

## 2015-07-04 DIAGNOSIS — M19041 Primary osteoarthritis, right hand: Secondary | ICD-10-CM | POA: Diagnosis not present

## 2015-07-04 DIAGNOSIS — Z87891 Personal history of nicotine dependence: Secondary | ICD-10-CM | POA: Diagnosis not present

## 2015-07-04 DIAGNOSIS — N401 Enlarged prostate with lower urinary tract symptoms: Secondary | ICD-10-CM | POA: Diagnosis not present

## 2015-07-04 DIAGNOSIS — I2510 Atherosclerotic heart disease of native coronary artery without angina pectoris: Secondary | ICD-10-CM | POA: Diagnosis not present

## 2015-07-04 DIAGNOSIS — R3911 Hesitancy of micturition: Secondary | ICD-10-CM | POA: Diagnosis not present

## 2015-07-04 DIAGNOSIS — E785 Hyperlipidemia, unspecified: Secondary | ICD-10-CM | POA: Diagnosis not present

## 2015-07-04 DIAGNOSIS — I10 Essential (primary) hypertension: Secondary | ICD-10-CM | POA: Insufficient documentation

## 2015-07-04 DIAGNOSIS — N138 Other obstructive and reflux uropathy: Secondary | ICD-10-CM | POA: Diagnosis not present

## 2015-07-04 DIAGNOSIS — M19042 Primary osteoarthritis, left hand: Secondary | ICD-10-CM | POA: Insufficient documentation

## 2015-07-04 DIAGNOSIS — Z87442 Personal history of urinary calculi: Secondary | ICD-10-CM | POA: Insufficient documentation

## 2015-07-04 DIAGNOSIS — R3912 Poor urinary stream: Secondary | ICD-10-CM | POA: Insufficient documentation

## 2015-07-04 HISTORY — DX: Benign prostatic hyperplasia without lower urinary tract symptoms: N40.0

## 2015-07-04 HISTORY — DX: Personal history of urinary calculi: Z87.442

## 2015-07-04 HISTORY — DX: Personal history of colonic polyps: Z86.010

## 2015-07-04 HISTORY — DX: Complete loss of teeth, unspecified cause, unspecified class: K08.109

## 2015-07-04 HISTORY — DX: Presence of dental prosthetic device (complete) (partial): Z97.2

## 2015-07-04 HISTORY — DX: Unspecified symptoms and signs involving the genitourinary system: R39.9

## 2015-07-04 HISTORY — PX: TRANSURETHRAL RESECTION OF PROSTATE: SHX73

## 2015-07-04 HISTORY — DX: Personal history of colon polyps, unspecified: Z86.0100

## 2015-07-04 HISTORY — DX: Presence of spectacles and contact lenses: Z97.3

## 2015-07-04 LAB — POCT I-STAT 4, (NA,K, GLUC, HGB,HCT)
Glucose, Bld: 130 mg/dL — ABNORMAL HIGH (ref 65–99)
HEMATOCRIT: 48 % (ref 39.0–52.0)
HEMOGLOBIN: 16.3 g/dL (ref 13.0–17.0)
Potassium: 4 mmol/L (ref 3.5–5.1)
SODIUM: 141 mmol/L (ref 135–145)

## 2015-07-04 SURGERY — TRANSURETHRAL RESECTION OF THE PROSTATE WITH GYRUS INSTRUMENTS
Anesthesia: General

## 2015-07-04 MED ORDER — BELLADONNA ALKALOIDS-OPIUM 16.2-60 MG RE SUPP
1.0000 | Freq: Four times a day (QID) | RECTAL | Status: DC | PRN
  Administered 2015-07-04: 1 via RECTAL
  Filled 2015-07-04: qty 1

## 2015-07-04 MED ORDER — ZOLPIDEM TARTRATE 5 MG PO TABS
5.0000 mg | ORAL_TABLET | Freq: Every evening | ORAL | Status: DC | PRN
  Filled 2015-07-04: qty 1

## 2015-07-04 MED ORDER — SULFAMETHOXAZOLE-TRIMETHOPRIM 800-160 MG PO TABS: 1.0000 | ORAL_TABLET | Freq: Two times a day (BID) | ORAL | Status: DC

## 2015-07-04 MED ORDER — SODIUM CHLORIDE 0.9 % IR SOLN
Status: DC | PRN
  Administered 2015-07-04: 30000 mL via INTRAVESICAL

## 2015-07-04 MED ORDER — ONDANSETRON HCL 4 MG/2ML IJ SOLN
4.0000 mg | Freq: Once | INTRAMUSCULAR | Status: DC | PRN
  Filled 2015-07-04: qty 2

## 2015-07-04 MED ORDER — SODIUM CHLORIDE 0.45 % IV SOLN
INTRAVENOUS | Status: DC
  Administered 2015-07-04 – 2015-07-05 (×2): via INTRAVENOUS
  Filled 2015-07-04: qty 1000

## 2015-07-04 MED ORDER — MEPERIDINE HCL 25 MG/ML IJ SOLN
6.2500 mg | INTRAMUSCULAR | Status: DC | PRN
  Filled 2015-07-04: qty 1

## 2015-07-04 MED ORDER — FENTANYL CITRATE (PF) 100 MCG/2ML IJ SOLN
INTRAMUSCULAR | Status: AC
  Filled 2015-07-04: qty 6

## 2015-07-04 MED ORDER — BELLADONNA ALKALOIDS-OPIUM 16.2-60 MG RE SUPP
RECTAL | Status: AC
  Filled 2015-07-04: qty 1

## 2015-07-04 MED ORDER — PROPOFOL 10 MG/ML IV BOLUS
INTRAVENOUS | Status: DC | PRN
  Administered 2015-07-04: 160 mg via INTRAVENOUS

## 2015-07-04 MED ORDER — EPHEDRINE SULFATE 50 MG/ML IJ SOLN
INTRAMUSCULAR | Status: DC | PRN
  Administered 2015-07-04: 10 mg via INTRAVENOUS

## 2015-07-04 MED ORDER — CIPROFLOXACIN IN D5W 400 MG/200ML IV SOLN
INTRAVENOUS | Status: AC
  Filled 2015-07-04: qty 200

## 2015-07-04 MED ORDER — METOPROLOL TARTRATE 50 MG PO TABS
50.0000 mg | ORAL_TABLET | Freq: Two times a day (BID) | ORAL | Status: DC
  Administered 2015-07-04 (×2): 50 mg via ORAL
  Filled 2015-07-04 (×3): qty 1

## 2015-07-04 MED ORDER — HYDROMORPHONE HCL 1 MG/ML IJ SOLN
0.2500 mg | INTRAMUSCULAR | Status: DC | PRN
  Filled 2015-07-04: qty 1

## 2015-07-04 MED ORDER — LIDOCAINE HCL (CARDIAC) 20 MG/ML IV SOLN
INTRAVENOUS | Status: DC | PRN
  Administered 2015-07-04: 60 mg via INTRAVENOUS

## 2015-07-04 MED ORDER — TAMSULOSIN HCL 0.4 MG PO CAPS
0.4000 mg | ORAL_CAPSULE | Freq: Every day | ORAL | Status: DC
  Administered 2015-07-04: 0.4 mg via ORAL
  Filled 2015-07-04 (×3): qty 1

## 2015-07-04 MED ORDER — CIPROFLOXACIN IN D5W 400 MG/200ML IV SOLN
400.0000 mg | INTRAVENOUS | Status: AC
  Administered 2015-07-04: 400 mg via INTRAVENOUS
  Filled 2015-07-04: qty 200

## 2015-07-04 MED ORDER — DOCUSATE SODIUM 100 MG PO CAPS
100.0000 mg | ORAL_CAPSULE | Freq: Two times a day (BID) | ORAL | Status: DC
Start: 2015-07-04 — End: 2015-07-05
  Administered 2015-07-04 (×2): 100 mg via ORAL
  Filled 2015-07-04 (×5): qty 1

## 2015-07-04 MED ORDER — ONDANSETRON HCL 4 MG/2ML IJ SOLN
4.0000 mg | INTRAMUSCULAR | Status: DC | PRN
  Filled 2015-07-04: qty 2

## 2015-07-04 MED ORDER — DEXAMETHASONE SODIUM PHOSPHATE 4 MG/ML IJ SOLN
INTRAMUSCULAR | Status: DC | PRN
  Administered 2015-07-04: 10 mg via INTRAVENOUS

## 2015-07-04 MED ORDER — LACTATED RINGERS IV SOLN
INTRAVENOUS | Status: DC
  Administered 2015-07-04 (×2): via INTRAVENOUS
  Filled 2015-07-04: qty 1000

## 2015-07-04 MED ORDER — SULFAMETHOXAZOLE-TRIMETHOPRIM 800-160 MG PO TABS
1.0000 | ORAL_TABLET | Freq: Two times a day (BID) | ORAL | Status: DC
  Administered 2015-07-04 (×2): 1 via ORAL
  Filled 2015-07-04 (×4): qty 1

## 2015-07-04 MED ORDER — FENTANYL CITRATE (PF) 100 MCG/2ML IJ SOLN
INTRAMUSCULAR | Status: DC | PRN
  Administered 2015-07-04 (×2): 25 ug via INTRAVENOUS
  Administered 2015-07-04: 50 ug via INTRAVENOUS

## 2015-07-04 MED ORDER — ACETAMINOPHEN 325 MG PO TABS
650.0000 mg | ORAL_TABLET | ORAL | Status: DC | PRN
  Filled 2015-07-04: qty 2

## 2015-07-04 MED ORDER — ONDANSETRON HCL 4 MG/2ML IJ SOLN
INTRAMUSCULAR | Status: DC | PRN
  Administered 2015-07-04: 4 mg via INTRAVENOUS

## 2015-07-04 SURGICAL SUPPLY — 37 items
BAG DRAIN URO-CYSTO SKYTR STRL (DRAIN) ×3 IMPLANT
BAG DRN ANRFLXCHMBR STRAP LEK (BAG)
BAG DRN UROCATH (DRAIN) ×1
BAG URINE DRAINAGE (UROLOGICAL SUPPLIES) IMPLANT
BAG URINE LEG 19OZ MD ST LTX (BAG) IMPLANT
CANISTER SUCT LVC 12 LTR MEDI- (MISCELLANEOUS) IMPLANT
CATH FOLEY 2WAY SLVR  5CC 20FR (CATHETERS)
CATH FOLEY 2WAY SLVR  5CC 22FR (CATHETERS)
CATH FOLEY 2WAY SLVR 30CC 22FR (CATHETERS) IMPLANT
CATH FOLEY 2WAY SLVR 5CC 20FR (CATHETERS) IMPLANT
CATH FOLEY 2WAY SLVR 5CC 22FR (CATHETERS) IMPLANT
CATH FOLEY 3WAY 30CC 22F (CATHETERS) ×2 IMPLANT
CATH HEMA 3WAY 30CC 24FR COUDE (CATHETERS) IMPLANT
CATH HEMA 3WAY 30CC 24FR RND (CATHETERS) IMPLANT
CLOTH BEACON ORANGE TIMEOUT ST (SAFETY) ×3 IMPLANT
ELECT REM PT RETURN 9FT ADLT (ELECTROSURGICAL) ×3
ELECTRODE REM PT RTRN 9FT ADLT (ELECTROSURGICAL) ×1 IMPLANT
EVACUATOR MICROVAS BLADDER (UROLOGICAL SUPPLIES) ×2 IMPLANT
GLOVE BIO SURGEON STRL SZ8 (GLOVE) IMPLANT
GLOVE BIOGEL PI IND STRL 7.5 (GLOVE) IMPLANT
GLOVE BIOGEL PI INDICATOR 7.5 (GLOVE) ×2
GLOVE ECLIPSE 7.5 STRL STRAW (GLOVE) ×2 IMPLANT
GOWN STRL REUS W/ TWL LRG LVL3 (GOWN DISPOSABLE) ×1 IMPLANT
GOWN STRL REUS W/ TWL XL LVL3 (GOWN DISPOSABLE) ×1 IMPLANT
GOWN STRL REUS W/TWL LRG LVL3 (GOWN DISPOSABLE) ×2 IMPLANT
GOWN STRL REUS W/TWL XL LVL3 (GOWN DISPOSABLE) ×2 IMPLANT
HOLDER FOLEY CATH W/STRAP (MISCELLANEOUS) IMPLANT
IV NS IRRIG 3000ML ARTHROMATIC (IV SOLUTION) ×18 IMPLANT
LOOP CUT BIPOLAR 24F LRG (ELECTROSURGICAL) ×2 IMPLANT
MANIFOLD NEPTUNE II (INSTRUMENTS) ×2 IMPLANT
NS IRRIG 500ML POUR BTL (IV SOLUTION) ×6 IMPLANT
PACK CYSTO (CUSTOM PROCEDURE TRAY) ×3 IMPLANT
PLUG CATH AND CAP STER (CATHETERS) IMPLANT
SET ASPIRATION TUBING (TUBING) ×2 IMPLANT
SYR 30ML LL (SYRINGE) IMPLANT
SYRINGE IRR TOOMEY STRL 70CC (SYRINGE) IMPLANT
WATER STERILE IRR 500ML POUR (IV SOLUTION) ×2 IMPLANT

## 2015-07-04 NOTE — Anesthesia Preprocedure Evaluation (Signed)
Anesthesia Evaluation  Patient identified by MRN, date of birth, ID band Patient awake    Reviewed: Allergy & Precautions, NPO status , Patient's Chart, lab work & pertinent test results  Airway Mallampati: I  TM Distance: >3 FB Neck ROM: Full    Dental   Pulmonary former smoker,    Pulmonary exam normal        Cardiovascular hypertension, Pt. on medications + CAD  Normal cardiovascular exam     Neuro/Psych    GI/Hepatic   Endo/Other    Renal/GU      Musculoskeletal   Abdominal   Peds  Hematology   Anesthesia Other Findings   Reproductive/Obstetrics                             Anesthesia Physical Anesthesia Plan  ASA: III  Anesthesia Plan: General   Post-op Pain Management:    Induction: Intravenous  Airway Management Planned: LMA  Additional Equipment:   Intra-op Plan:   Post-operative Plan: Extubation in OR  Informed Consent: I have reviewed the patients History and Physical, chart, labs and discussed the procedure including the risks, benefits and alternatives for the proposed anesthesia with the patient or authorized representative who has indicated his/her understanding and acceptance.     Plan Discussed with: CRNA and Surgeon  Anesthesia Plan Comments:         Anesthesia Quick Evaluation

## 2015-07-04 NOTE — Anesthesia Postprocedure Evaluation (Signed)
Anesthesia Post Note  Patient: William [name] [facility]  Procedure(s) Performed: Procedure(s) (LRB): TRANSURETHRAL RESECTION OF THE PROSTATE WITH GYRUS INSTRUMENTS (N/A)  Anesthesia type: general  Patient location: PACU  Post pain: Pain level controlled  Post assessment: Patient's Cardiovascular Status Stable  Last Vitals:  Filed Vitals:   07/04/15 1115  BP: 106/64  Pulse: 81  Temp:   Resp: 12    Post vital signs: Reviewed and stable  Level of consciousness: sedated  Complications: No apparent anesthesia complications

## 2015-07-04 NOTE — Discharge Instructions (Signed)
Transurethral Resection of the Prostate ° °Care After ° °Refer to this sheet in the next few weeks. These discharge instructions provide you with general information on caring for yourself after you leave the hospital. Your caregiver may also give you specific instructions. Your treatment has been planned according to the most current medical practices available, but unavoidable complications sometimes occur. If you have any problems or questions after discharge, please call your caregiver. ° °HOME CARE INSTRUCTIONS  ° °Medications °· You may receive medicine for pain management. As your level of discomfort decreases, adjustments in your pain medicines may be made.  °· Take all medicines as directed.  °· You may be given a medicine (antibiotic) to kill germs following surgery. Finish all medicines. Let your caregiver know if you have any side effects or problems from the medicine.  °· If you are on aspirin, it would be best not to restart the aspirin until the blood in the urine clears °Hygiene °· You can take a shower after surgery.  °· You should not take a bath while you still have the urethral catheter. °Activity °· You will be encouraged to get out of bed as much as possible and increase your activity level as tolerated.  °· Spend the first week in and around your home. For 3 weeks, avoid the following:  °· Straining.  °· Running.  °· Strenuous work.  °· Walks longer than a few blocks.  °· Riding for extended periods.  °· Sexual relations.  °· Do not lift heavy objects (more than 20 pounds) for at least 1 month. When lifting, use your arms instead of your abdominal muscles.  °· You will be encouraged to walk as tolerated. Do not exert yourself. Increase your activity level slowly. Remember that it is important to keep moving after an operation of any type. This cuts down on the possibility of developing blood clots.  °· Your caregiver will tell you when you can resume driving and light housework. Discuss this  at your first office visit after discharge. °Diet °· No special diet is ordered after a TURP. However, if you are on a special diet for another medical problem, it should be continued.  °· Normal fluid intake is usually recommended.  °· Avoid alcohol and caffeinated drinks for 2 weeks. They irritate the bladder. Decaffeinated drinks are okay.  °· Avoid spicy foods.  °Bladder Function °· For the first 10 days, empty the bladder whenever you feel a definite desire. Do not try to hold the urine for long periods of time.  °· Urinating once or twice a night even after you are healed is not uncommon.  °· You may see some recurrence of blood in the urine after discharge from the hospital. This usually happens within 2 weeks after the procedure.If this occurs, force fluids again as you did in the hospital and reduce your activity.  °Bowel Function °· You may experience some constipation after surgery. This can be minimized by increasing fluids and fiber in your diet. Drink enough water and fluids to keep your urine clear or pale yellow.  °· A stool softener may be prescribed for use at home. Do not strain to move your bowels.  °· If you are requiring increased pain medicine, it is important that you take stool softeners to prevent constipation. This will help to promote proper healing by reducing the need to strain to move your bowels.  °Sexual Activity °· Semen movement in the opposite direction and into the bladder (  retrograde ejaculation) may occur. Since the semen passes into the bladder, cloudy urine can occur the first time you urinate after intercourse. Or, you may not have an ejaculation during erection. Ask your caregiver when you can resume sexual activity. Retrograde ejaculation and reduced semen discharge should not reduce one's pleasure of intercourse.  °Postoperative Visit °· Arrange the date and time of your after surgery visit with your caregiver.  °Return to Work °· After your recovery is complete, you will  be able to return to work and resume all activities. Your caregiver will inform you when you can return to work.  °Foley Catheter Care °A soft, flexible tube (Foley catheter) may have been placed in your bladder to drain urine and fluid. Follow these instructions: °Taking Care of the Catheter °· Keep the area where the catheter leaves your body clean.  °· Attach the catheter to the leg so there is no tension on the catheter.  °· Keep the drainage bag below the level of the bladder, but keep it OFF the floor.  °· Do not take long soaking baths. Your caregiver will give instructions about showering.  °· Wash your hands before touching ANYTHING related to the catheter or bag.  °· Using mild soap and warm water on a washcloth:  °· Clean the area closest to the catheter insertion site using a circular motion around the catheter.  °· Clean the catheter itself by wiping AWAY from the insertion site for several inches down the tube.  °· NEVER wipe upward as this could sweep bacteria up into the urethra (tube in your body that normally drains the bladder) and cause infection.  °· Place a small amount of sterile lubricant at the tip of the penis where the catheter is entering.  °Taking Care of the Drainage Bags °· Two drainage bags may be taken home: a large overnight drainage bag, and a smaller leg bag which fits underneath clothing.  °· It is okay to wear the overnight bag at any time, but NEVER wear the smaller leg bag at night.  °· Keep the drainage bag well below the level of your bladder. This prevents backflow of urine into the bladder and allows the urine to drain freely.  °· Anchor the tubing to your leg to prevent pulling or tension on the catheter. Use tape or a leg strap provided by the hospital.  °· Empty the drainage bag when it is 1/2 to 3/4 full. Wash your hands before and after touching the bag.  °· Periodically check the tubing for kinks to make sure there is no pressure on the tubing which could restrict  the flow of urine.  °Changing the Drainage Bags °· Cleanse both ends of the clean bag with alcohol before changing.  °· Pinch off the rubber catheter to avoid urine spillage during the disconnection.  °· Disconnect the dirty bag and connect the clean one.  °· Empty the dirty bag carefully to avoid a urine spill.  °· Attach the new bag to the leg with tape or a leg strap.  °Cleaning the Drainage Bags °· Whenever a drainage bag is disconnected, it must be cleaned quickly so it is ready for the next use.  °· Wash the bag in warm, soapy water.  °· Rinse the bag thoroughly with warm water.  °· Soak the bag for 30 minutes in a solution of white vinegar and water (1 cup vinegar to 1 quart warm water).  °· Rinse with warm water.  °SEEK MEDICAL   CARE IF:  °· You have chills or night sweats.  °· You are leaking around your catheter or have problems with your catheter. It is not uncommon to have sporadic leakage around your catheter as a result of bladder spasms. If the leakage stops, there is not much need for concern. If you are uncertain, call your caregiver.  °· You develop side effects that you think are coming from your medicines.  °SEEK IMMEDIATE MEDICAL CARE IF:  °· You are suddenly unable to urinate. Check to see if there are any kinks in the drainage tubing that may cause this. If you cannot find any kinks, call your caregiver immediately. This is an emergency.  °· You develop shortness of breath or chest pains.  °· Bleeding persists or clots develop in your urine.  °· You have a fever.  °· You develop pain in your back or over your lower belly (abdomen).  °· You develop pain or swelling in your legs.  °· Any problems you are having get worse rather than better.  °MAKE SURE YOU:  °· Understand these instructions.  °· Will watch your condition.  °· Will get help right away if you are not doing well or get worse.  °Document Released: 10/08/2005 Document Revised: 06/20/2011 Document Reviewed: 06/01/2009 °[employer]®  Patient Information ©2012 [employer].Transurethral Resection of the Prostate °Care After °Refer to this sheet in the next few weeks. These discharge instructions provide you with general information on caring for yourself after you leave the hospital. Your caregiver may also give you specific instructions. Your treatment has been planned according to the most current medical practices available, but unavoidable complications sometimes occur. If you have any problems or questions after discharge, please call your caregiver. °

## 2015-07-04 NOTE — Anesthesia Procedure Notes (Signed)
Procedure Name: LMA Insertion Date/Time: 07/04/2015 7:34 AM Performed by: Denna Haggard D Pre-anesthesia Checklist: Patient identified, Emergency Drugs available, Suction available and Patient being monitored Patient Re-evaluated:Patient Re-evaluated prior to inductionOxygen Delivery Method: Circle System Utilized Preoxygenation: Pre-oxygenation with 100% oxygen Intubation Type: IV induction Ventilation: Mask ventilation without difficulty LMA: LMA inserted LMA Size: 5.0 Number of attempts: 1 Airway Equipment and Method: Bite block Placement Confirmation: positive ETCO2 Tube secured with: Tape Dental Injury: Teeth and Oropharynx as per pre-operative assessment

## 2015-07-04 NOTE — Transfer of Care (Signed)
Immediate Anesthesia Transfer of Care Note  Patient: William [name] [facility]  Procedure(s) Performed: Procedure(s) (LRB): TRANSURETHRAL RESECTION OF THE PROSTATE WITH GYRUS INSTRUMENTS (N/A)  Patient Location: PACU  Anesthesia Type: General  Level of Consciousness: awake, oriented, sedated and patient cooperative  Airway & Oxygen Therapy: Patient Spontanous Breathing and Patient connected to face mask oxygen  Post-op Assessment: Report given to PACU RN and Post -op Vital signs reviewed and stable  Post vital signs: Reviewed and stable  Complications: No apparent anesthesia complications

## 2015-07-04 NOTE — Op Note (Signed)
Preoperative diagnosis: 1. Bladder outlet obstruction secondary to BPH  Postoperative diagnosis:  1. Bladder outlet obstruction secondary to BPH  Procedure:  1. Cystoscopy 2. Transurethral resection of the prostate with Gyrus loop  Surgeon: Lillette Boxer. Cadynce Garrette, M.D.  Anesthesia: General  Complications: None  Drain: Foley catheter--22 fr 3 way  EBL: Minimal  Specimens: 1. Prostate chips  Disposition of specimens: Pathology  Indication: William [name] [facility] is a patient with bladder outlet obstruction secondary to benign prostatic hyperplasia. After reviewing the management options for treatment, he elected to proceed with the above surgical procedure(s). We have discussed the potential benefits and risks of the procedure, side effects of the proposed treatment, the likelihood of the patient achieving the goals of the procedure, and any potential problems that might occur during the procedure or recuperation. Informed consent has been obtained.  Description of procedure:  The patient was identified in the holding area.He received preoperative antibiotics. He was then taken to the operating room. General anesthetic was administered.  The patient was then placed in the dorsal lithotomy position, prepped and draped in the usual sterile fashion. Timeout was then performed.  A resectoscope sheath was placed using the obturator, and the resectoscope, loop and telescope were placed.  The bladder was then systematically examined in its entirety. There was no evidence of  tumors, stones, or other mucosal pathology.  The ureteral orifices were identified and marked so as to be avoided during the procedure.  The prostate adenoma was then resected utilizing loop cautery resection with the monopolar/bipolar cutting loop.  The prostate adenoma from the bladder neck back to the verumontanum was resected beginning at the six o'clock position and then extended to include the right and left lobes of  the prostate and anterior prostate, respectively. Care was taken not to resect distal to the verumontanum  Hemostasis was then achieved with the cautery and the bladder was emptied and reinspected with no significant bleeding noted at the end of the procedure.  Resected chips were irrigated from the bladder with the evacuator and sent to pathology.  A 3 way catheter was then placed into the bladder and placed on continuous bladder irrigation.  The patient appeared to tolerate the procedure well and without complications. The patient was able to be awakened and transferred to the recovery unit in satisfactory condition. He tolerated the procedure well.

## 2015-07-05 ENCOUNTER — Encounter (HOSPITAL_BASED_OUTPATIENT_CLINIC_OR_DEPARTMENT_OTHER): Payer: Self-pay | Admitting: Urology

## 2015-07-05 DIAGNOSIS — N401 Enlarged prostate with lower urinary tract symptoms: Secondary | ICD-10-CM | POA: Diagnosis not present

## 2015-07-05 LAB — BASIC METABOLIC PANEL
Anion gap: 6 (ref 5–15)
BUN: 16 mg/dL (ref 6–20)
CHLORIDE: 107 mmol/L (ref 101–111)
CO2: 26 mmol/L (ref 22–32)
Calcium: 8.9 mg/dL (ref 8.9–10.3)
Creatinine, Ser: 1.15 mg/dL (ref 0.61–1.24)
GFR calc Af Amer: >60 mL/min (ref >60)
GFR calc non Af Amer: >60 mL/min (ref >60)
GLUCOSE: 121 mg/dL — AB (ref 65–99)
POTASSIUM: 4 mmol/L (ref 3.5–5.1)
SODIUM: 139 mmol/L (ref 135–145)

## 2015-07-05 MED ORDER — HYDROCODONE-ACETAMINOPHEN 10-325 MG PO TABS: 1.0000 | ORAL_TABLET | ORAL | Status: DC | PRN

## 2015-07-05 MED ORDER — PHENAZOPYRIDINE HCL 200 MG PO TABS: 200.0000 mg | ORAL_TABLET | Freq: Three times a day (TID) | ORAL | Status: DC | PRN

## 2015-07-05 NOTE — Discharge Summary (Signed)
Physician Discharge Summary  Patient ID: Bowden Boody [facility] MRN: [id number] DOB/AGE: 26-Oct-1940 74 y.o.  Admit date: 07/04/2015 Discharge date: 07/05/2015  Admission Diagnoses: BPH with outlet obstructive symptoms  Discharge Diagnoses:  Active Problems:   Enlarged prostate with urinary obstruction   Discharged Condition: good  Hospital Course: He underwent an elective TURP without complication. He was doing well throughout the night and his catheter was draining. It was removed this morning and he has urinated already. He is not having any pain. He is felt ready for discharge at this time.   Discharge Exam: Blood pressure 120/67, pulse 63, temperature 98 F (36.7 C), temperature source Oral, resp. rate 20, height 5\' 9"  (1.753 m), weight 97.297 kg (214 lb 8 oz), SpO2 97 %. His abdomen is soft, flat and nontender.  Disposition: 01-Home or Self Care  Discharge Instructions    Discharge patient    Complete by:  As directed             Medication List    STOP taking these medications        aspirin EC 81 MG tablet     finasteride 5 MG tablet  Commonly known as:  PROSCAR     tamsulosin 0.4 MG Caps capsule  Commonly known as:  FLOMAX      TAKE these medications        HYDROcodone-acetaminophen 10-325 MG per tablet  Commonly known as:  NORCO  Take 1-2 tablets by mouth every 4 (four) hours as needed for moderate pain. Maximum dose per 24 hours -8 pills.     metoprolol 50 MG tablet  Commonly known as:  LOPRESSOR  Take 1 tablet (50 mg total) by mouth 2 (two) times daily.     phenazopyridine 200 MG tablet  Commonly known as:  PYRIDIUM  Take 1 tablet (200 mg total) by mouth 3 (three) times daily as needed for pain.     sulfamethoxazole-trimethoprim 800-160 MG per tablet  Commonly known as:  BACTRIM DS,SEPTRA DS  Take 1 tablet by mouth 2 (two) times daily.           Follow-up Information    Follow up with Jorja Loa, MD On 08/03/2015.   Specialty:   Urology   Why:  For your appiontment at 8:15   Contact information:   [place] [address] [phone]       Signed: Claybon Jabs 07/05/2015, 6:57 AM

## 2015-07-05 NOTE — Progress Notes (Signed)
D/C'd foley at 0630 on 07-05-15.

## 2016-07-05 ENCOUNTER — Encounter (HOSPITAL_COMMUNITY): Payer: Self-pay

## 2016-07-05 ENCOUNTER — Observation Stay (HOSPITAL_COMMUNITY)
Admission: EM | Admit: 2016-07-05 | Discharge: 2016-07-07 | Disposition: A | Discharge status: Home / Self Care | Payer: Medicare Other | Attending: Family Medicine | Admitting: Family Medicine

## 2016-07-05 ENCOUNTER — Emergency Department (HOSPITAL_COMMUNITY): Payer: Medicare Other

## 2016-07-05 DIAGNOSIS — I2510 Atherosclerotic heart disease of native coronary artery without angina pectoris: Secondary | ICD-10-CM | POA: Diagnosis present

## 2016-07-05 DIAGNOSIS — E7800 Pure hypercholesterolemia, unspecified: Secondary | ICD-10-CM | POA: Insufficient documentation

## 2016-07-05 DIAGNOSIS — I2583 Coronary atherosclerosis due to lipid rich plaque: Secondary | ICD-10-CM | POA: Diagnosis not present

## 2016-07-05 DIAGNOSIS — R079 Chest pain, unspecified: Secondary | ICD-10-CM

## 2016-07-05 DIAGNOSIS — Z79899 Other long term (current) drug therapy: Secondary | ICD-10-CM | POA: Diagnosis not present

## 2016-07-05 DIAGNOSIS — I25118 Atherosclerotic heart disease of native coronary artery with other forms of angina pectoris: Secondary | ICD-10-CM | POA: Diagnosis not present

## 2016-07-05 DIAGNOSIS — Z7982 Long term (current) use of aspirin: Secondary | ICD-10-CM | POA: Diagnosis not present

## 2016-07-05 DIAGNOSIS — R0602 Shortness of breath: Secondary | ICD-10-CM | POA: Insufficient documentation

## 2016-07-05 DIAGNOSIS — Z792 Long term (current) use of antibiotics: Secondary | ICD-10-CM | POA: Insufficient documentation

## 2016-07-05 DIAGNOSIS — I10 Essential (primary) hypertension: Secondary | ICD-10-CM | POA: Insufficient documentation

## 2016-07-05 DIAGNOSIS — N179 Acute kidney failure, unspecified: Secondary | ICD-10-CM | POA: Diagnosis not present

## 2016-07-05 DIAGNOSIS — Z87891 Personal history of nicotine dependence: Secondary | ICD-10-CM | POA: Insufficient documentation

## 2016-07-05 DIAGNOSIS — E785 Hyperlipidemia, unspecified: Secondary | ICD-10-CM | POA: Insufficient documentation

## 2016-07-05 DIAGNOSIS — D72829 Elevated white blood cell count, unspecified: Secondary | ICD-10-CM | POA: Diagnosis not present

## 2016-07-05 DIAGNOSIS — R072 Precordial pain: Principal | ICD-10-CM | POA: Insufficient documentation

## 2016-07-05 LAB — TROPONIN I: Troponin I: <0.03 ng/mL (ref <0.03)

## 2016-07-05 LAB — I-STAT TROPONIN, ED: Troponin i, poc: 0 ng/mL (ref 0.00–0.08)

## 2016-07-05 LAB — COMPREHENSIVE METABOLIC PANEL
ALBUMIN: 3.6 g/dL (ref 3.5–5.0)
ALK PHOS: 57 U/L (ref 38–126)
ALT: 27 U/L (ref 17–63)
ANION GAP: 7 (ref 5–15)
AST: 24 U/L (ref 15–41)
BILIRUBIN TOTAL: 1.1 mg/dL (ref 0.3–1.2)
BUN: 24 mg/dL — AB (ref 6–20)
CALCIUM: 9.1 mg/dL (ref 8.9–10.3)
CO2: 21 mmol/L — ABNORMAL LOW (ref 22–32)
Chloride: 114 mmol/L — ABNORMAL HIGH (ref 101–111)
Creatinine, Ser: 1.64 mg/dL — ABNORMAL HIGH (ref 0.61–1.24)
GFR calc Af Amer: 46 mL/min — ABNORMAL LOW (ref >60)
GFR calc non Af Amer: 39 mL/min — ABNORMAL LOW (ref >60)
GLUCOSE: 102 mg/dL — AB (ref 65–99)
Potassium: 3.7 mmol/L (ref 3.5–5.1)
Sodium: 142 mmol/L (ref 135–145)
TOTAL PROTEIN: 6.2 g/dL — AB (ref 6.5–8.1)

## 2016-07-05 LAB — CBC
HEMATOCRIT: 46.2 % (ref 39.0–52.0)
HEMOGLOBIN: 15.4 g/dL (ref 13.0–17.0)
MCH: 30 pg (ref 26.0–34.0)
MCHC: 33.3 g/dL (ref 30.0–36.0)
MCV: 89.9 fL (ref 78.0–100.0)
Platelets: 158 10*3/uL (ref 150–400)
RBC: 5.14 MIL/uL (ref 4.22–5.81)
RDW: 13.1 % (ref 11.5–15.5)
WBC: 12.1 10*3/uL — ABNORMAL HIGH (ref 4.0–10.5)

## 2016-07-05 LAB — PROTIME-INR
INR: 1.08
Prothrombin Time: 14 seconds (ref 11.4–15.2)

## 2016-07-05 LAB — APTT: APTT: 30 s (ref 24–36)

## 2016-07-05 MED ORDER — NITROGLYCERIN 0.4 MG SL SUBL
0.4000 mg | SUBLINGUAL_TABLET | SUBLINGUAL | Status: AC | PRN
  Administered 2016-07-05 (×3): 0.4 mg via SUBLINGUAL
  Filled 2016-07-05: qty 1

## 2016-07-05 MED ORDER — ACETAMINOPHEN 325 MG PO TABS: 650.0000 mg | ORAL_TABLET | ORAL | Status: DC | PRN

## 2016-07-05 MED ORDER — FENTANYL CITRATE (PF) 100 MCG/2ML IJ SOLN: 12.5000 ug | INTRAMUSCULAR | Status: DC | PRN

## 2016-07-05 MED ORDER — ONDANSETRON HCL 4 MG/2ML IJ SOLN: 4.0000 mg | Freq: Four times a day (QID) | INTRAMUSCULAR | Status: DC | PRN

## 2016-07-05 MED ORDER — HEPARIN SODIUM (PORCINE) 5000 UNIT/ML IJ SOLN
5000.0000 [IU] | Freq: Three times a day (TID) | INTRAMUSCULAR | Status: DC
  Administered 2016-07-05 – 2016-07-07 (×4): 5000 [IU] via SUBCUTANEOUS
  Filled 2016-07-05 (×4): qty 1

## 2016-07-05 MED ORDER — NITROGLYCERIN 2 % TD OINT
0.5000 [in_us] | TOPICAL_OINTMENT | Freq: Four times a day (QID) | TRANSDERMAL | Status: DC
  Filled 2016-07-05: qty 30

## 2016-07-05 MED ORDER — ALPRAZOLAM 0.25 MG PO TABS: 0.2500 mg | ORAL_TABLET | Freq: Two times a day (BID) | ORAL | Status: DC | PRN

## 2016-07-05 MED ORDER — FENTANYL CITRATE (PF) 100 MCG/2ML IJ SOLN
50.0000 ug | Freq: Once | INTRAMUSCULAR | Status: AC
  Administered 2016-07-05: 50 ug via INTRAVENOUS
  Filled 2016-07-05: qty 2

## 2016-07-05 MED ORDER — METOPROLOL TARTRATE 50 MG PO TABS
50.0000 mg | ORAL_TABLET | Freq: Two times a day (BID) | ORAL | Status: DC
  Administered 2016-07-05 – 2016-07-07 (×4): 50 mg via ORAL
  Filled 2016-07-05 (×4): qty 1

## 2016-07-05 MED ORDER — ATORVASTATIN CALCIUM 40 MG PO TABS
40.0000 mg | ORAL_TABLET | Freq: Every day | ORAL | Status: DC
  Administered 2016-07-05 – 2016-07-06 (×2): 40 mg via ORAL
  Filled 2016-07-05 (×2): qty 1

## 2016-07-05 MED ORDER — SODIUM CHLORIDE 0.9 % IV SOLN
INTRAVENOUS | Status: DC
  Administered 2016-07-05: 75 mL/h via INTRAVENOUS

## 2016-07-05 MED ORDER — ASPIRIN EC 81 MG PO TBEC
81.0000 mg | DELAYED_RELEASE_TABLET | Freq: Every day | ORAL | Status: DC
  Administered 2016-07-07: 81 mg via ORAL
  Filled 2016-07-05: qty 1

## 2016-07-05 MED ORDER — SODIUM CHLORIDE 0.9 % IV BOLUS (SEPSIS)
1000.0000 mL | Freq: Once | INTRAVENOUS | Status: AC
  Administered 2016-07-05: 1000 mL via INTRAVENOUS

## 2016-07-05 NOTE — ED Triage Notes (Signed)
Pt arrives EMS with Co chest pain that started while doing yard work. Had CP SHOB, dizziness, nausea and saw spots but did not pass out. NS 750 by EMS, [id number] and nitro x 2 sl with pain to 5/10. HX of 3 blockages with medical management, no stents.

## 2016-07-05 NOTE — Progress Notes (Signed)
Attempted to get report from ED nurse. The nurse is busy at the moment; will call back as soon as possible.

## 2016-07-05 NOTE — ED Provider Notes (Signed)
[facility] DEPT Provider Note   CSN: [id number] Arrival date & time: 07/05/16  1432     History   Chief Complaint Chief Complaint  Patient presents with  . Chest Pain    HPI William [name] [facility] is a 75 y.o. male.  The history is provided by the patient.  Chest Pain   This is a new problem. The current episode started 3 to 5 hours ago. The problem occurs constantly. The problem has been gradually improving. The pain is associated with exertion. The pain is present in the substernal region. The pain radiates to the left shoulder. Associated symptoms include diaphoresis, dizziness and shortness of breath. Pertinent negatives include no fever and no syncope. Risk factors include being elderly.  His past medical history is significant for CAD.  Patient with h/o CAD presents with chest pain/pressure/shortness of breath and lightheadedness.  He was working on the roof of a house for several hours when the symptoms began He got off the house and went home and his wife called 911 He reports recent fatigue and mild CP but not this severe He reports this is similar to prior episodes He has known h/o CAD  Past Medical History:  Diagnosis Date  . BPH (benign prostatic hyperplasia)   . CAD (coronary artery disease) cardiologist-  dr Daneen Schick   with intermediate obstruction in the distal RCA and PDA by catheter 2013, Dr Tamala Julian  . Full dentures   . History of colon polyps   . History of kidney stones    1970's  . HTN (hypertension)   . Hyperlipidemia   . Lower urinary tract symptoms (LUTS)   . Osteoarthritis    thumbs  . Sciatica   . Wears glasses     Patient Active Problem List   Diagnosis Date Noted  . Enlarged prostate with urinary obstruction 07/04/2015  . Pure hypercholesterolemia   . Osteoarthritis   . Back pain   . Sciatica   . Osteoarthrosis   . Neuropathy ([place])   . Angina of effort ([place])   . Hyperlipidemia   . HTN (hypertension)   . Colon polyp   . CAD (coronary  artery disease)   . Angina pectoris, crescendo ([place]) 12/31/2012    Class: Acute    Past Surgical History:  Procedure Laterality Date  . ANTERIOR CERVICAL DECOMP/DISCECTOMY FUSION  05-24-2009   C3 -- C7  . APPENDECTOMY  age 57  . CARDIOVASCULAR STRESS TEST  06-23-2015  dr Daneen Schick   normal lexiscan study/  no perfusion defects or ischemia/  normal LV function and wall motion , ef 57%  . LEFT HEART CATHETERIZATION WITH CORONARY ANGIOGRAM N/A 01/01/2013   Procedure: LEFT HEART CATHETERIZATION WITH CORONARY ANGIOGRAM;  Surgeon: Sinclair Grooms, MD;  Location: [facility] CATH LAB;  Service: Cardiovascular;  Laterality: N/A;   Intermediate stenosis in  proximal PDA 50-70%;  dRCA 50%;  ostial and mid CFX 50%;  widely patent LAD;  normal LVF, ef 60%  . LUMBAR SPINE SURGERY  1990's  . TRANSURETHRAL RESECTION OF PROSTATE N/A 07/04/2015   Procedure: TRANSURETHRAL RESECTION OF THE PROSTATE WITH GYRUS INSTRUMENTS;  Surgeon: Franchot Gallo, MD;  Location: [facility];  Service: Urology;  Laterality: N/A;       Home Medications    Prior to Admission medications   Medication Sig Start Date End Date Taking? Authorizing Provider  aspirin EC 81 MG tablet Take 81 mg by mouth daily.   Yes Historical Provider, MD  metoprolol (  LOPRESSOR) 50 MG tablet Take 1 tablet (50 mg total) by mouth 2 (two) times daily. 01/01/13  Yes Belva Crome, MD  HYDROcodone-acetaminophen [facility]) 10-325 MG per tablet Take 1-2 tablets by mouth every 4 (four) hours as needed for moderate pain. Maximum dose per 24 hours -8 pills. 07/05/15   Kathie Rhodes, MD  phenazopyridine (PYRIDIUM) 200 MG tablet Take 1 tablet (200 mg total) by mouth 3 (three) times daily as needed for pain. Patient not taking: Reported on 07/05/2016 07/05/15   Kathie Rhodes, MD  sulfamethoxazole-trimethoprim (BACTRIM DS,SEPTRA DS) 800-160 MG per tablet Take 1 tablet by mouth 2 (two) times daily. 07/04/15   Franchot Gallo, MD    Family History Family  History  Problem Relation Age of Onset  . Dementia Father   . Heart attack      Social History Social History  Substance Use Topics  . Smoking status: Former Smoker    Years: 12.00    Types: Cigarettes    Quit date: 06/27/1964  . Smokeless tobacco: Never Used  . Alcohol use No     Allergies   Codeine   Review of Systems Review of Systems  Constitutional: Positive for diaphoresis and fatigue. Negative for fever.  Respiratory: Positive for shortness of breath.   Cardiovascular: Positive for chest pain. Negative for syncope.  Neurological: Positive for dizziness. Negative for syncope.  All other systems reviewed and are negative.    Physical Exam Updated Vital Signs BP 135/91   Pulse 64   Temp 97.6 F (36.4 C) (Oral)   Resp 18   Ht 5\' 9"  (1.753 m)   Wt 96.6 kg   SpO2 96%   BMI 31.45 kg/m   Physical Exam CONSTITUTIONAL: Well developed/well nourished HEAD: Normocephalic/atraumatic EYES: EOMI/PERRL ENMT: Mucous membranes moist NECK: supple no meningeal signs SPINE/BACK:entire spine nontender CV: S1/S2 noted, no murmurs/rubs/gallops noted LUNGS: Lungs are clear to auscultation bilaterally, no apparent distress ABDOMEN: soft, nontender, no rebound or guarding, bowel sounds noted throughout abdomen GU:no cva tenderness NEURO: Pt is awake/alert/appropriate, moves all extremitiesx4.  No facial droop.   EXTREMITIES: pulses normal/equalx4 full ROM, no calf tenderness SKIN: warm, color normal PSYCH: no abnormalities of mood noted, alert and oriented to situation   ED Treatments / Results  Labs (all labs ordered are listed, but only abnormal results are displayed) Labs Reviewed  CBC - Abnormal; Notable for the following:       Result Value   WBC 12.1 (*)    All other components within normal limits  COMPREHENSIVE METABOLIC PANEL - Abnormal; Notable for the following:    Chloride 114 (*)    CO2 21 (*)    Glucose, Bld 102 (*)    BUN 24 (*)    Creatinine, Ser  1.64 (*)    Total Protein 6.2 (*)    GFR calc non Af Amer 39 (*)    GFR calc Af Amer 46 (*)    All other components within normal limits  APTT  PROTIME-INR  I-STAT TROPOININ, ED    EKG  EKG Interpretation  Date/Time:  Thursday July 05 2016 14:36:27 EDT Ventricular Rate:  66 PR Interval:    QRS Duration: 95 QT Interval:  415 QTC Calculation: 435 R Axis:   -54 Text Interpretation:  Sinus rhythm Ventricular premature complex LAD, consider left anterior fascicular block other than pVC, no significant change  Confirmed by LIU MD, Hinton Dyer [id number]) on 07/05/2016 2:44:35 PM Also confirmed by Oleta Mouse MD, DANA [phone]), editor Lorenda Cahill CT, Leda Gauze (  [id number])  on 07/05/2016 2:53:13 PM       Radiology Dg Chest Portable 1 View  Result Date: 07/05/2016 CLINICAL DATA:  Post side chest extends into the left shoulder. EXAM: PORTABLE CHEST 1 VIEW COMPARISON:  12/31/2012 FINDINGS: 1544 hours. The lungs are clear wiithout focal pneumonia, edema, pneumothorax or pleural effusion. Cardiopericardial silhouette is at upper limits of normal for size. The visualized bony structures of the thorax are intact. Telemetry leads overlie the chest. IMPRESSION: Low volumes with basilar atelectasis. Electronically Signed   By: Misty Stanley M.D.   On: 07/05/2016 15:55    Procedures Procedures (including critical care time)  Medications Ordered in ED Medications  fentaNYL (SUBLIMAZE) injection 50 mcg (not administered)  sodium chloride 0.9 % bolus 1,000 mL (1,000 mLs Intravenous New Bag/Given 07/05/16 1601)  nitroGLYCERIN (NITROSTAT) SL tablet 0.4 mg (0.4 mg Sublingual Given 07/05/16 1759)     Initial Impression / Assessment and Plan / ED Course  I have reviewed the triage vital signs and the nursing notes.  Pertinent labs & imaging results that were available during my care of the patient were reviewed by me and considered in my medical decision making (see chart for details).  Clinical Course    Pt with known h/o  CAD here with chest pressure/sob while working on house It is now improved but he is still having CP with already having NTG Will admit 6:22 PM D/w dr opyd Will admit Pt stable/awake/alert He has already received NTG and ASA Final Clinical Impressions(s) / ED Diagnoses   Final diagnoses:  Chest pain, rule out acute myocardial infarction    New Prescriptions New Prescriptions   No medications on file     Ripley Fraise, MD 07/05/16 1823

## 2016-07-05 NOTE — ED Notes (Signed)
Attempted to call report

## 2016-07-05 NOTE — H&P (Signed)
History and Physical    Stacie Filbrun [facility] [id number] DOB: July 14, 1941 DOA: 07/05/2016  PCP:  Melinda Crutch, MD   Patient coming from: Home   Chief Complaint: Chest pain   HPI: William [name] is a 75 y.o. male with medical history significant for hypertension, osteoarthritis, and coronary artery disease with intermediate obstruction in the distal RCA and PDA noted on coronary cath in 2013 and treated medically, now presenting to the emergency department with acute onset of chest pain while performing physically demanding work at home. Patient reports angina several years ago which prompted catheterization in 2013, but states that he had been stable with his medical management and had not required any nitroglycerin in more than a year until today. 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 Symptoms persisted despite rest and EMS was activated. Patient reports some slight exertional chest pain over the past 2 weeks which had been fleeting and did not concern him much. En route to the hospital with EMS, 750 mL normal saline, 324 mg aspirin, and 2 sublingual nitroglycerin were administered. After the second nitroglycerin, pain improved from a "9/10" to a "5/10."  ED Course: Upon arrival to the ED, patient is found to be afebrile, saturating well on room air, and with vital signs stable. EKG demonstrates sinus rhythm with VPCs and left axis deviation and chest x-ray is notable for low volume and basilar atelectasis. Chemistry panel reveals a serum creatinine of 1.64, up from an apparent baseline of 1.1. CBC features a leukocytosis to 12,100 with other indices normal. INR is within the normal limits and troponin is undetectable. Patient was given a liter of normal saline in the emergency department and day IV push of fentanyl. He has had  recurrent pain while in the ED but there has been no change on the telemetry monitor and he has remained hemodynamically stable. He will be admitted to the stepdown unit for ongoing evaluation and management of chest pain with underlying CAD, and concerning for possible ACS.  Review of Systems:  All other systems reviewed and apart from HPI, are negative.  Past Medical History:  Diagnosis Date  . BPH (benign prostatic hyperplasia)   . CAD (coronary artery disease) cardiologist-  dr Daneen Schick   with intermediate obstruction in the distal RCA and PDA by catheter 2013, Dr Tamala Julian  . Full dentures   . History of colon polyps   . History of kidney stones    1970's  . HTN (hypertension)   . Hyperlipidemia   . Lower urinary tract symptoms (LUTS)   . Osteoarthritis    thumbs  . Sciatica   . Wears glasses     Past Surgical History:  Procedure Laterality Date  . ANTERIOR CERVICAL DECOMP/DISCECTOMY FUSION  05-24-2009   C3 -- C7  . APPENDECTOMY  age 38  . CARDIOVASCULAR STRESS TEST  06-23-2015  dr Daneen Schick   normal lexiscan study/  no perfusion defects or ischemia/  normal LV function and wall motion , ef 57%  . LEFT HEART CATHETERIZATION WITH CORONARY ANGIOGRAM N/A 01/01/2013   Procedure: LEFT HEART CATHETERIZATION WITH CORONARY ANGIOGRAM;  Surgeon: Sinclair Grooms, MD;  Location: [facility] CATH LAB;  Service: Cardiovascular;  Laterality: N/A;   Intermediate stenosis in  proximal PDA 50-70%;  dRCA 50%;  ostial and mid CFX 50%;  widely patent LAD;  normal LVF, ef 60%  . LUMBAR SPINE SURGERY  1990's  . TRANSURETHRAL RESECTION OF PROSTATE N/A 07/04/2015   Procedure: TRANSURETHRAL RESECTION OF THE PROSTATE WITH GYRUS INSTRUMENTS;  Surgeon: Franchot Gallo, MD;  Location: [facility];  Service: Urology;  Laterality: N/A;     reports that he quit smoking about 52 years ago. His smoking use included Cigarettes. He quit after 12.00 years of use. He has never used smokeless tobacco. He  reports that he does not drink alcohol or use drugs.  Allergies  Allergen Reactions  . Codeine Nausea And Vomiting and Other (See Comments)    "deathly sick"    Family History  Problem Relation Age of Onset  . Dementia Father   . Heart attack       Prior to Admission medications   Medication Sig Start Date End Date Taking? Authorizing Provider  aspirin EC 81 MG tablet Take 81 mg by mouth daily.   Yes Historical Provider, MD  metoprolol (LOPRESSOR) 50 MG tablet Take 1 tablet (50 mg total) by mouth 2 (two) times daily. 01/01/13  Yes Belva Crome, MD  HYDROcodone-acetaminophen [facility]) 10-325 MG per tablet Take 1-2 tablets by mouth every 4 (four) hours as needed for moderate pain. Maximum dose per 24 hours -8 pills. 07/05/15   Kathie Rhodes, MD  phenazopyridine (PYRIDIUM) 200 MG tablet Take 1 tablet (200 mg total) by mouth 3 (three) times daily as needed for pain. Patient not taking: Reported on 07/05/2016 07/05/15   Kathie Rhodes, MD  sulfamethoxazole-trimethoprim (BACTRIM DS,SEPTRA DS) 800-160 MG per tablet Take 1 tablet by mouth 2 (two) times daily. 07/04/15   Franchot Gallo, MD    Physical Exam: Vitals:   07/05/16 1805 07/05/16 1815 07/05/16 1830 07/05/16 1845  BP: 123/73 120/75 121/75 142/80  Pulse: 75 60 (!) 55 (!) 57  Resp: 16 11 16 14   Temp:      TempSrc:      SpO2: 97% 99% 98% 100%  Weight:      Height:          Constitutional: NAD, calm, comfortable Eyes: PERTLA, lids and conjunctivae normal ENMT: Mucous membranes are moist. Posterior pharynx clear of any exudate or lesions.   Neck: normal, supple, no masses, no thyromegaly Respiratory: clear to auscultation bilaterally, no wheezing, no crackles.    Cardiovascular: S1 & S2 heard, regular rate and rhythm. No extremity edema. No significant JVD. Abdomen: No distension, no tenderness, no masses palpated. Bowel sounds normal.  Musculoskeletal: no clubbing / cyanosis. No joint deformity upper and lower extremities. Normal  muscle tone.  Skin: no significant rashes, lesions, ulcers. Warm, dry, well-perfused. Neurologic: CN 2-12 grossly intact. Sensation intact, DTR normal. Strength 5/5 in all 4 limbs.  Psychiatric: Normal judgment and insight. Alert and oriented x 3. Normal mood and affect.     Labs on Admission: I have personally reviewed following labs and imaging studies  CBC:  Recent Labs Lab 07/05/16 1445  WBC 12.1*  HGB 15.4  HCT 46.2  MCV 89.9  PLT 0000000   Basic Metabolic Panel:  Recent Labs Lab 07/05/16 1445  NA 142  K 3.7  CL 114*  CO2 21*  GLUCOSE 102*  BUN 24*  CREATININE 1.64*  CALCIUM 9.1   GFR: Estimated Creatinine Clearance: 44.6 mL/min (by C-G formula based on SCr of 1.64 mg/dL (H)). Liver Function Tests:  Recent Labs Lab 07/05/16 1445  AST 24  ALT 27  ALKPHOS 57  BILITOT 1.1  PROT 6.2*  ALBUMIN 3.6   No results for input(s): LIPASE, AMYLASE in the last 168 hours. No results for input(s): AMMONIA in the last 168 hours. Coagulation Profile:  Recent Labs Lab 07/05/16 1445  INR 1.08   Cardiac Enzymes: No results for input(s): CKTOTAL, CKMB, CKMBINDEX, TROPONINI in the last 168 hours. BNP (last 3 results) No results for input(s): PROBNP in the last 8760 hours. HbA1C: No results for input(s): HGBA1C in the last 72 hours. CBG: No results for input(s): GLUCAP in the last 168 hours. Lipid Profile: No results for input(s): CHOL, HDL, LDLCALC, TRIG, CHOLHDL, LDLDIRECT in the last 72 hours. Thyroid Function Tests: No results for input(s): TSH, T4TOTAL, FREET4, T3FREE, THYROIDAB in the last 72 hours. Anemia Panel: No results for input(s): VITAMINB12, FOLATE, FERRITIN, TIBC, IRON, RETICCTPCT in the last 72 hours. Urine analysis: No results found for: COLORURINE, APPEARANCEUR, LABSPEC, PHURINE, GLUCOSEU, HGBUR, BILIRUBINUR, KETONESUR, PROTEINUR, UROBILINOGEN, NITRITE, LEUKOCYTESUR Sepsis Labs: @LABRCNTIP (procalcitonin:4,lacticidven:4) )No results found for this  or any previous visit (from the past 240 hour(s)).   Radiological Exams on Admission: Dg Chest Portable 1 View  Result Date: 07/05/2016 CLINICAL DATA:  Post side chest extends into the left shoulder. EXAM: PORTABLE CHEST 1 VIEW COMPARISON:  12/31/2012 FINDINGS: 1544 hours. The lungs are clear wiithout focal pneumonia, edema, pneumothorax or pleural effusion. Cardiopericardial silhouette is at upper limits of normal for size. The visualized bony structures of the thorax are intact. Telemetry leads overlie the chest. IMPRESSION: Low volumes with basilar atelectasis. Electronically Signed   By: Misty Stanley M.D.   On: 07/05/2016 15:55    EKG: Independently reviewed. Sinus rhythm, VPC, LAD   Assessment/Plan  1. Chest pain, hx of CAD  - Presents with typical anginal sxs  - Hx of intermediate obstructions in distal RCA and PDA on cath in 2013; has been medically managed with no angina in >1 yr  - ASA 324 mg and SL NTG given x2 en route to ED with partial relief  - Initial EKG does not have any features concerning for acute ischemia  - Initial troponin is 0.00  - Recurrent pain in ED while at rest; no change in EKG, second trop pending  - NTG paste placed; continue Lopressor and daily ASA; statin added in setting of possible ACS   - Monitor on telemetry for ischemic changes, obtain serial troponin measurements and EKG's    2. AKI  - SCr 1.64 on admission, up from apparent baseline of 1.1-1.2  - Given 750 cc NS en route with EMS  - Likely a prerenal azotemia given the hx of working outdoors all day recently with little fluid intake  - Another 1 L NS given in ED  - Continue a gentle IVF hydration overnight and repeat chem panel in am   3. Hypertension - At goal currently, will continue Lopressor    4. Leukocytosis  - WBC 12,100 on admission without fever or apparent infectious source - Likely reactive, will culture if febrile     DVT prophylaxis: sq heparin  Code Status: Full  Family  Communication: Wife and daughter updated at bedside Disposition Plan: Observe in stepdown unit Consults called: None Admission status: Observation    Vianne Bulls, MD [facility] Pager [phone]  If 7PM-7AM, please contact night-coverage [email or web address] Password [email or web address]  07/05/2016, 7:03 PM

## 2016-07-06 ENCOUNTER — Observation Stay (HOSPITAL_BASED_OUTPATIENT_CLINIC_OR_DEPARTMENT_OTHER): Payer: Medicare Other

## 2016-07-06 ENCOUNTER — Encounter (HOSPITAL_COMMUNITY)
Admission: EM | Disposition: A | Discharge status: Home / Self Care | Payer: Self-pay | Source: Home / Self Care | Attending: Emergency Medicine

## 2016-07-06 DIAGNOSIS — I2583 Coronary atherosclerosis due to lipid rich plaque: Secondary | ICD-10-CM | POA: Diagnosis not present

## 2016-07-06 DIAGNOSIS — I10 Essential (primary) hypertension: Secondary | ICD-10-CM | POA: Diagnosis not present

## 2016-07-06 DIAGNOSIS — N179 Acute kidney failure, unspecified: Secondary | ICD-10-CM | POA: Diagnosis not present

## 2016-07-06 DIAGNOSIS — R079 Chest pain, unspecified: Secondary | ICD-10-CM

## 2016-07-06 DIAGNOSIS — I2510 Atherosclerotic heart disease of native coronary artery without angina pectoris: Secondary | ICD-10-CM

## 2016-07-06 DIAGNOSIS — R072 Precordial pain: Secondary | ICD-10-CM | POA: Diagnosis not present

## 2016-07-06 DIAGNOSIS — R0602 Shortness of breath: Secondary | ICD-10-CM | POA: Diagnosis not present

## 2016-07-06 HISTORY — PX: CARDIAC CATHETERIZATION: SHX172

## 2016-07-06 LAB — LIPID PANEL
Cholesterol: 154 mg/dL (ref 0–200)
HDL: 28 mg/dL — ABNORMAL LOW (ref >40)
LDL CALC: 104 mg/dL — AB (ref 0–99)
TRIGLYCERIDES: 109 mg/dL (ref <150)
Total CHOL/HDL Ratio: 5.5 RATIO
VLDL: 22 mg/dL (ref 0–40)

## 2016-07-06 LAB — BASIC METABOLIC PANEL
Anion gap: 6 (ref 5–15)
BUN: 21 mg/dL — AB (ref 6–20)
CALCIUM: 8.9 mg/dL (ref 8.9–10.3)
CO2: 24 mmol/L (ref 22–32)
CREATININE: 1.08 mg/dL (ref 0.61–1.24)
Chloride: 110 mmol/L (ref 101–111)
GFR calc Af Amer: >60 mL/min (ref >60)
GLUCOSE: 101 mg/dL — AB (ref 65–99)
POTASSIUM: 3.7 mmol/L (ref 3.5–5.1)
SODIUM: 140 mmol/L (ref 135–145)

## 2016-07-06 LAB — D-DIMER, QUANTITATIVE: D-Dimer, Quant: 0.36 ug/mL-FEU (ref 0.00–0.50)

## 2016-07-06 LAB — TROPONIN I

## 2016-07-06 LAB — MAGNESIUM: MAGNESIUM: 2.1 mg/dL (ref 1.7–2.4)

## 2016-07-06 SURGERY — LEFT HEART CATH AND CORONARY ANGIOGRAPHY

## 2016-07-06 MED ORDER — SODIUM CHLORIDE 0.9 % WEIGHT BASED INFUSION
1.0000 mL/kg/h | INTRAVENOUS | Status: AC
  Administered 2016-07-06: 1 mL/kg/h via INTRAVENOUS

## 2016-07-06 MED ORDER — MIDAZOLAM HCL 2 MG/2ML IJ SOLN
INTRAMUSCULAR | Status: DC | PRN
  Administered 2016-07-06: 2 mg via INTRAVENOUS

## 2016-07-06 MED ORDER — SODIUM CHLORIDE 0.9 % IV SOLN: 250.0000 mL | INTRAVENOUS | Status: DC | PRN

## 2016-07-06 MED ORDER — ASPIRIN 81 MG PO CHEW
81.0000 mg | CHEWABLE_TABLET | ORAL | Status: AC
  Administered 2016-07-06: 81 mg via ORAL
  Filled 2016-07-06: qty 1

## 2016-07-06 MED ORDER — VERAPAMIL HCL 2.5 MG/ML IV SOLN
INTRAVENOUS | Status: DC | PRN
  Administered 2016-07-06: 8 mL via INTRA_ARTERIAL

## 2016-07-06 MED ORDER — HEPARIN (PORCINE) IN NACL 2-0.9 UNIT/ML-% IJ SOLN
INTRAMUSCULAR | Status: AC
  Filled 2016-07-06: qty 1500

## 2016-07-06 MED ORDER — SODIUM CHLORIDE 0.9 % IV SOLN
INTRAVENOUS | Status: DC
  Administered 2016-07-06 (×2): via INTRAVENOUS

## 2016-07-06 MED ORDER — FENTANYL CITRATE (PF) 100 MCG/2ML IJ SOLN
INTRAMUSCULAR | Status: AC
  Filled 2016-07-06: qty 2

## 2016-07-06 MED ORDER — SODIUM CHLORIDE 0.9% FLUSH: 3.0000 mL | Freq: Two times a day (BID) | INTRAVENOUS | Status: DC

## 2016-07-06 MED ORDER — SODIUM CHLORIDE 0.9% FLUSH: 3.0000 mL | INTRAVENOUS | Status: DC | PRN

## 2016-07-06 MED ORDER — MIDAZOLAM HCL 2 MG/2ML IJ SOLN
INTRAMUSCULAR | Status: AC
  Filled 2016-07-06: qty 2

## 2016-07-06 MED ORDER — ASPIRIN 81 MG PO CHEW: 81.0000 mg | CHEWABLE_TABLET | ORAL | Status: DC

## 2016-07-06 MED ORDER — IOPAMIDOL (ISOVUE-370) INJECTION 76%
INTRAVENOUS | Status: DC | PRN
  Administered 2016-07-06: 50 mL via INTRA_ARTERIAL

## 2016-07-06 MED ORDER — LIDOCAINE HCL (PF) 1 % IJ SOLN
INTRAMUSCULAR | Status: DC | PRN
  Administered 2016-07-06: 2 mL

## 2016-07-06 MED ORDER — HEPARIN (PORCINE) IN NACL 2-0.9 UNIT/ML-% IJ SOLN
INTRAMUSCULAR | Status: DC | PRN
  Administered 2016-07-06: 1500 mL

## 2016-07-06 MED ORDER — VERAPAMIL HCL 2.5 MG/ML IV SOLN
INTRAVENOUS | Status: AC
  Filled 2016-07-06: qty 2

## 2016-07-06 MED ORDER — IOPAMIDOL (ISOVUE-370) INJECTION 76%
INTRAVENOUS | Status: AC
  Filled 2016-07-06: qty 100

## 2016-07-06 MED ORDER — ONDANSETRON HCL 4 MG/2ML IJ SOLN: 4.0000 mg | Freq: Four times a day (QID) | INTRAMUSCULAR | Status: DC | PRN

## 2016-07-06 MED ORDER — FENTANYL CITRATE (PF) 100 MCG/2ML IJ SOLN
INTRAMUSCULAR | Status: DC | PRN
  Administered 2016-07-06: 25 ug via INTRAVENOUS

## 2016-07-06 MED ORDER — LIDOCAINE HCL (PF) 1 % IJ SOLN
INTRAMUSCULAR | Status: AC
  Filled 2016-07-06: qty 30

## 2016-07-06 MED ORDER — ACETAMINOPHEN 325 MG PO TABS: 650.0000 mg | ORAL_TABLET | ORAL | Status: DC | PRN

## 2016-07-06 MED ORDER — HEPARIN SODIUM (PORCINE) 1000 UNIT/ML IJ SOLN
INTRAMUSCULAR | Status: DC | PRN
  Administered 2016-07-06: 5000 [IU] via INTRAVENOUS

## 2016-07-06 MED ORDER — HEPARIN SODIUM (PORCINE) 1000 UNIT/ML IJ SOLN
INTRAMUSCULAR | Status: AC
  Filled 2016-07-06: qty 1

## 2016-07-06 MED ORDER — SODIUM CHLORIDE 0.9% FLUSH
3.0000 mL | Freq: Two times a day (BID) | INTRAVENOUS | Status: DC
  Administered 2016-07-06: 3 mL via INTRAVENOUS

## 2016-07-06 SURGICAL SUPPLY — 10 items
CATH INFINITI 5 FR JL3.5 (CATHETERS) ×2 IMPLANT
CATH INFINITI 5FR AL1 (CATHETERS) ×2 IMPLANT
CATH INFINITI JR4 5F (CATHETERS) ×2 IMPLANT
DEVICE RAD COMP TR BAND LRG (VASCULAR PRODUCTS) ×2 IMPLANT
GLIDESHEATH SLEND SS 6F .021 (SHEATH) ×3 IMPLANT
KIT HEART LEFT (KITS) ×3 IMPLANT
PACK CARDIAC CATHETERIZATION (CUSTOM PROCEDURE TRAY) ×3 IMPLANT
TRANSDUCER W/STOPCOCK (MISCELLANEOUS) ×3 IMPLANT
TUBING CIL FLEX 10 FLL-RA (TUBING) ×3 IMPLANT
WIRE SAFE-T 1.5MM-J .035X260CM (WIRE) ×2 IMPLANT

## 2016-07-06 NOTE — Progress Notes (Signed)
Cath showed 50% right PDA and otherwise non obstructive CAD with normal LVF. Needs aggressive secondary prevention.  Continue ASA/BB/statin.  Will need repeat FLP and ALT in 6 weeks.  Would check d-dimer to rule out PE.  Will check 2D echo and if ok then no other recs at this time.  Will sign off.  Call with any questions.

## 2016-07-06 NOTE — Progress Notes (Signed)
  Echocardiogram 2D Echocardiogram has been performed.  Tresa Res 07/06/2016, 5:16 PM

## 2016-07-06 NOTE — Consult Note (Signed)
CARDIOLOGY CONSULT NOTE   Patient ID: William [name] MRN: [id number] DOB/AGE: 05/16/1941 75 y.o.  Admit date: 07/05/2016  Primary Physician    Melinda Crutch, MD Primary Cardiologist   Dr. Tamala Julian Reason for Consultation   Chest pain Requesting Physician  Dr. Sloan Leiter  HPI: William [name] is a 75 y.o. male with a history of CAD which manage medically, hypertension, hyperlipidemia and BPH who presented to [facility] by EMS for evaluation of chest pain.   This patient underwent office evaluation on 12/31/2012. While in the office he complained of severe high initial plan was to perform stress testing. Because he was having pain at rest he was admitted to the hospital with the diagnoses of accelerating angina. IV nitroglycerin and heparin relieved the patient's chest discomfort across severe headache. After 12 hours the patient's markers were negative. No EKG changes were noted. He underwent diagnostic catheterization from the right radial approach demonstrating an 80-90% PDA stenosis, 50% distal RCA, and 50% mid circumflex--> managed medically.   Myoview 06/2015 was normal study for cardiac clearance for urologic procedure, TURP.  She was in usual state of health up until 2 weeks ago when he noted fatigue/chest discomfort while working outside. He is upgrading his second house by himself. Yesterday he was placing roof Shingles on his roof in hot he had a severe substernal chest discomfort 9/10 described as tightness. He complains of shortness of breath at that time however contributes to extremity. He also had nausea but no radiation of pain. He states that he was dehydrated at that time. He drove himself to his house for about 2 miles where his pain lasted and EMS was called. His chest tightness eventually resolved after administration of second sublingual nitroglycerin.  In ER, EKG showed sinus rhythm at rate of 66 minute, PVCs. CBC features a leukocytosis to 12,100 with other indices  normal. Chest x-ray shows low volumes with basilar atelectasis. Serum creatinine elevated to 1.64 with BUN of 24. He was given a liter of normal saline. He is chest pain-free since admission. Point-of-care troponin negative. Troponin I x 2 negative. LDL 104. Repeat EKG this morning shows sinus bradycardia at rate of 56 bpm and T-wave inversion in lead 3.  Past Medical History:  Diagnosis Date  . BPH (benign prostatic hyperplasia)   . CAD (coronary artery disease) cardiologist-  dr Daneen Schick   with intermediate obstruction in the distal RCA and PDA by catheter 2013, Dr Tamala Julian  . Full dentures   . History of colon polyps   . History of kidney stones    1970's  . HTN (hypertension)   . Hyperlipidemia   . Lower urinary tract symptoms (LUTS)   . Osteoarthritis    thumbs  . Sciatica   . Wears glasses      Past Surgical History:  Procedure Laterality Date  . ANTERIOR CERVICAL DECOMP/DISCECTOMY FUSION  05-24-2009   C3 -- C7  . APPENDECTOMY  age 55  . CARDIOVASCULAR STRESS TEST  06-23-2015  dr Daneen Schick   normal lexiscan study/  no perfusion defects or ischemia/  normal LV function and wall motion , ef 57%  . LEFT HEART CATHETERIZATION WITH CORONARY ANGIOGRAM N/A 01/01/2013   Procedure: LEFT HEART CATHETERIZATION WITH CORONARY ANGIOGRAM;  Surgeon: Sinclair Grooms, MD;  Location: [facility] CATH LAB;  Service: Cardiovascular;  Laterality: N/A;   Intermediate stenosis in  proximal PDA 50-70%;  dRCA 50%;  ostial and mid CFX 50%;  widely patent LAD;  normal LVF, ef 60%  . LUMBAR SPINE SURGERY  1990's  . TRANSURETHRAL RESECTION OF PROSTATE N/A 07/04/2015   Procedure: TRANSURETHRAL RESECTION OF THE PROSTATE WITH GYRUS INSTRUMENTS;  Surgeon: Franchot Gallo, MD;  Location: [facility];  Service: Urology;  Laterality: N/A;    Allergies  Allergen Reactions  . Codeine Nausea And Vomiting and Other (See Comments)    "deathly sick"    I have reviewed the patient's current  medications . aspirin EC  81 mg Oral Daily  . atorvastatin  40 mg Oral q1800  . heparin  5,000 Units Subcutaneous Q8H  . metoprolol  50 mg Oral BID  . nitroGLYCERIN  0.5 inch Topical Q6H     acetaminophen, ALPRAZolam, fentaNYL (SUBLIMAZE) injection, ondansetron (ZOFRAN) IV  Prior to Admission medications   Medication Sig Start Date End Date Taking? Authorizing Provider  aspirin EC 81 MG tablet Take 81 mg by mouth daily.   Yes Historical Provider, MD  metoprolol (LOPRESSOR) 50 MG tablet Take 1 tablet (50 mg total) by mouth 2 (two) times daily. 01/01/13  Yes Belva Crome, MD  HYDROcodone-acetaminophen [facility]) 10-325 MG per tablet Take 1-2 tablets by mouth every 4 (four) hours as needed for moderate pain. Maximum dose per 24 hours -8 pills. 07/05/15   Kathie Rhodes, MD  phenazopyridine (PYRIDIUM) 200 MG tablet Take 1 tablet (200 mg total) by mouth 3 (three) times daily as needed for pain. Patient not taking: Reported on 07/05/2016 07/05/15   Kathie Rhodes, MD  sulfamethoxazole-trimethoprim (BACTRIM DS,SEPTRA DS) 800-160 MG per tablet Take 1 tablet by mouth 2 (two) times daily. 07/04/15   Franchot Gallo, MD     Social History   Social History  . Marital status: Married    Spouse name: N/A  . Number of children: N/A  . Years of education: N/A   Occupational History  . Not on file.   Social History Main Topics  . Smoking status: Former Smoker    Years: 12.00    Types: Cigarettes    Quit date: 06/27/1964  . Smokeless tobacco: Never Used  . Alcohol use No  . Drug use: No  . Sexual activity: Not on file   Other Topics Concern  . Not on file   Social History Narrative  . No narrative on file    Family Status  Relation Status  . Father Alive  .     Family History  Problem Relation Age of Onset  . Dementia Father   . Heart attack       ROS:  Full 14 point review of systems complete and found to be negative unless listed above.  Physical Exam: Blood pressure (!) 163/86,  pulse 60, temperature 97.5 F (36.4 C), temperature source Oral, resp. rate 18, height 5\' 9"  (1.753 m), weight 214 lb 4.6 oz (97.2 kg), SpO2 97 %.  General: Well developed, well nourished, male in no acute distress Head: Eyes PERRLA, No xanthomas. Normocephalic and atraumatic, oropharynx without edema or exudate.  Lungs: Resp regular and unlabored, CTA. Heart: RRR no s3, s4, or murmurs..   Neck: No carotid bruits. No lymphadenopathy.  No JVD. Abdomen: Bowel sounds present, abdomen soft and non-tender without masses or hernias noted. Msk:  No spine or cva tenderness. No weakness, no joint deformities or effusions. Extremities: No clubbing, cyanosis or edema. DP/PT/Radials 2+ and equal bilaterally. Neuro: Alert and oriented X 3. No focal deficits noted. Psych:  Good affect, responds appropriately Skin: No rashes or lesions noted.  Labs:   Lab Results  Component Value Date   WBC 12.1 (H) 07/05/2016   HGB 15.4 07/05/2016   HCT 46.2 07/05/2016   MCV 89.9 07/05/2016   PLT 158 07/05/2016    Recent Labs  07/05/16 1445  INR 1.08    Recent Labs Lab 07/05/16 1445  NA 142  K 3.7  CL 114*  CO2 21*  BUN 24*  CREATININE 1.64*  CALCIUM 9.1  PROT 6.2*  BILITOT 1.1  ALKPHOS 57  ALT 27  AST 24  GLUCOSE 102*  ALBUMIN 3.6   No results found for: MG  Recent Labs  07/05/16 2114 07/06/16 0152  TROPONINI <0.03 <0.03    Recent Labs  07/05/16 1517  TROPIPOC 0.00   Pro B Natriuretic peptide (BNP)  Date/Time Value Ref Range Status  12/31/2012 08:45 PM 49.6 0 - 125 pg/mL Final   Lab Results  Component Value Date   CHOL 154 07/06/2016   HDL 28 (L) 07/06/2016   LDLCALC 104 (H) 07/06/2016   TRIG 109 07/06/2016      ECG:  Sinus rhythm with PVCs Vent. rate 66 BPM PR interval * ms QRS duration 95 ms QT/QTc 415/435 ms P-R-T axes -35 -54 34  Radiology:  Dg Chest Portable 1 View  Result Date: 07/05/2016 CLINICAL DATA:  Post side chest extends into the left shoulder.  EXAM: PORTABLE CHEST 1 VIEW COMPARISON:  12/31/2012 FINDINGS: 1544 hours. The lungs are clear wiithout focal pneumonia, edema, pneumothorax or pleural effusion. Cardiopericardial silhouette is at upper limits of normal for size. The visualized bony structures of the thorax are intact. Telemetry leads overlie the chest. IMPRESSION: Low volumes with basilar atelectasis. Electronically Signed   By: Misty Stanley M.D.   On: 07/05/2016 15:55    ASSESSMENT AND PLAN:     1. Chest pain - Concerning for anginal pain as pain resolved with SL nitro x 2. Could be due to dehydration as he is working outside in heat. He does have hx of CAD with 80-90% PDA stenosis, 50% distal RCA, and 50% mid circumflex--> managed medically. Myoview 06/2015 was normal. LDL 104. Continue ASA, statin and BB. Will discuss plan with MD. Cath vs medical management (if opted will add Imdur).   2. CAD - As above  3. Fatigue - Could be due to working outside in heat vs worsening of CAD vs bradycardia. Telemetry shows sinus rhythm with PACs and PVCs with a rate mostly in 50s to 60s. He did have one episode of SVT yesterday (16 beats).   4. Hypertension - Normal at presentation. However elevated last night.   5. AKI - NS given yesterday. Pending BMET this morning.  6. Leukocytosis - Per primary   Signed: Mirta Mally, PA 07/06/2016, 7:17 AM Pager [phone]  Co-Sign MD

## 2016-07-06 NOTE — H&P (View-Only) (Signed)
CARDIOLOGY CONSULT NOTE   Patient ID: William [name] MRN: [id number] DOB/AGE: [birth year] 75 y.o.  Admit date: 07/05/2016  Primary Physician    Melinda Crutch, MD Primary Cardiologist   Dr. Tamala Julian Reason for Consultation   Chest pain Requesting Physician  Dr. Sloan Leiter  HPI: [name] is a 75 y.o. male with a history of CAD which manage medically, hypertension, hyperlipidemia and BPH who presented to [facility] by EMS for evaluation of chest pain.   This patient underwent office evaluation on 12/31/2012. While in the office he complained of severe high initial plan was to perform stress testing. Because he was having pain at rest he was admitted to the hospital with the diagnoses of accelerating angina. IV nitroglycerin and heparin relieved the patient's chest discomfort across severe headache. After 12 hours the patient's markers were negative. No EKG changes were noted. He underwent diagnostic catheterization from the right radial approach demonstrating an 80-90% PDA stenosis, 50% distal RCA, and 50% mid circumflex--> managed medically.   Myoview 06/2015 was normal study for cardiac clearance for urologic procedure, TURP.  She was in usual state of health up until 2 weeks ago when he noted fatigue/chest discomfort while working outside. He is upgrading his second house by himself. Yesterday he was placing roof Shingles on his roof in hot he had a severe substernal chest discomfort 9/10 described as tightness. He complains of shortness of breath at that time however contributes to extremity. He also had nausea but no radiation of pain. He states that he was dehydrated at that time. He drove himself to his house for about 2 miles where his pain lasted and EMS was called. His chest tightness eventually resolved after administration of second sublingual nitroglycerin.  In ER, EKG showed sinus rhythm at rate of 66 minute, PVCs. CBC features a leukocytosis to 12,100 with other indices  normal. Chest x-ray shows low volumes with basilar atelectasis. Serum creatinine elevated to 1.64 with BUN of 24. He was given a liter of normal saline. He is chest pain-free since admission. Point-of-care troponin negative. Troponin I x 2 negative. LDL 104. Repeat EKG this morning shows sinus bradycardia at rate of 56 bpm and T-wave inversion in lead 3.  Past Medical History:  Diagnosis Date  . BPH (benign prostatic hyperplasia)   . CAD (coronary artery disease) cardiologist-  dr Daneen Schick   with intermediate obstruction in the distal RCA and PDA by catheter 2013, Dr Tamala Julian  . Full dentures   . History of colon polyps   . History of kidney stones    1970's  . HTN (hypertension)   . Hyperlipidemia   . Lower urinary tract symptoms (LUTS)   . Osteoarthritis    thumbs  . Sciatica   . Wears glasses      Past Surgical History:  Procedure Laterality Date  . ANTERIOR CERVICAL DECOMP/DISCECTOMY FUSION  05-24-2009   C3 -- C7  . APPENDECTOMY  age 88  . CARDIOVASCULAR STRESS TEST  06-23-2015  dr Daneen Schick   normal lexiscan study/  no perfusion defects or ischemia/  normal LV function and wall motion , ef 57%  . LEFT HEART CATHETERIZATION WITH CORONARY ANGIOGRAM N/A 01/01/2013   Procedure: LEFT HEART CATHETERIZATION WITH CORONARY ANGIOGRAM;  Surgeon: Sinclair Grooms, MD;  Location: [facility] CATH LAB;  Service: Cardiovascular;  Laterality: N/A;   Intermediate stenosis in  proximal PDA 50-70%;  dRCA 50%;  ostial and mid CFX 50%;  widely patent LAD;  normal LVF, ef 60%  . LUMBAR SPINE SURGERY  1990's  . TRANSURETHRAL RESECTION OF PROSTATE N/A 07/04/2015   Procedure: TRANSURETHRAL RESECTION OF THE PROSTATE WITH GYRUS INSTRUMENTS;  Surgeon: Franchot Gallo, MD;  Location: [facility];  Service: Urology;  Laterality: N/A;    Allergies  Allergen Reactions  . Codeine Nausea And Vomiting and Other (See Comments)    "deathly sick"    I have reviewed the patient's current  medications . aspirin EC  81 mg Oral Daily  . atorvastatin  40 mg Oral q1800  . heparin  5,000 Units Subcutaneous Q8H  . metoprolol  50 mg Oral BID  . nitroGLYCERIN  0.5 inch Topical Q6H     acetaminophen, ALPRAZolam, fentaNYL (SUBLIMAZE) injection, ondansetron (ZOFRAN) IV  Prior to Admission medications   Medication Sig Start Date End Date Taking? Authorizing Provider  aspirin EC 81 MG tablet Take 81 mg by mouth daily.   Yes Historical Provider, MD  metoprolol (LOPRESSOR) 50 MG tablet Take 1 tablet (50 mg total) by mouth 2 (two) times daily. 01/01/13  Yes Belva Crome, MD  HYDROcodone-acetaminophen [facility]) 10-325 MG per tablet Take 1-2 tablets by mouth every 4 (four) hours as needed for moderate pain. Maximum dose per 24 hours -8 pills. 07/05/15   Kathie Rhodes, MD  phenazopyridine (PYRIDIUM) 200 MG tablet Take 1 tablet (200 mg total) by mouth 3 (three) times daily as needed for pain. Patient not taking: Reported on 07/05/2016 07/05/15   Kathie Rhodes, MD  sulfamethoxazole-trimethoprim (BACTRIM DS,SEPTRA DS) 800-160 MG per tablet Take 1 tablet by mouth 2 (two) times daily. 07/04/15   Franchot Gallo, MD     Social History   Social History  . Marital status: Married    Spouse name: N/A  . Number of children: N/A  . Years of education: N/A   Occupational History  . Not on file.   Social History Main Topics  . Smoking status: Former Smoker    Years: 12.00    Types: Cigarettes    Quit date: 06/27/1964  . Smokeless tobacco: Never Used  . Alcohol use No  . Drug use: No  . Sexual activity: Not on file   Other Topics Concern  . Not on file   Social History Narrative  . No narrative on file    Family Status  Relation Status  . Father Alive  .     Family History  Problem Relation Age of Onset  . Dementia Father   . Heart attack       ROS:  Full 14 point review of systems complete and found to be negative unless listed above.  Physical Exam: Blood pressure (!) 163/86,  pulse 60, temperature 97.5 F (36.4 C), temperature source Oral, resp. rate 18, height 5\' 9"  (1.753 m), weight 214 lb 4.6 oz (97.2 kg), SpO2 97 %.  General: Well developed, well nourished, male in no acute distress Head: Eyes PERRLA, No xanthomas. Normocephalic and atraumatic, oropharynx without edema or exudate.  Lungs: Resp regular and unlabored, CTA. Heart: RRR no s3, s4, or murmurs..   Neck: No carotid bruits. No lymphadenopathy.  No JVD. Abdomen: Bowel sounds present, abdomen soft and non-tender without masses or hernias noted. Msk:  No spine or cva tenderness. No weakness, no joint deformities or effusions. Extremities: No clubbing, cyanosis or edema. DP/PT/Radials 2+ and equal bilaterally. Neuro: Alert and oriented X 3. No focal deficits noted. Psych:  Good affect, responds appropriately Skin: No rashes or lesions noted.  Labs:   Lab Results  Component Value Date   WBC 12.1 (H) 07/05/2016   HGB 15.4 07/05/2016   HCT 46.2 07/05/2016   MCV 89.9 07/05/2016   PLT 158 07/05/2016    Recent Labs  07/05/16 1445  INR 1.08    Recent Labs Lab 07/05/16 1445  NA 142  K 3.7  CL 114*  CO2 21*  BUN 24*  CREATININE 1.64*  CALCIUM 9.1  PROT 6.2*  BILITOT 1.1  ALKPHOS 57  ALT 27  AST 24  GLUCOSE 102*  ALBUMIN 3.6   No results found for: MG  Recent Labs  07/05/16 2114 07/06/16 0152  TROPONINI <0.03 <0.03    Recent Labs  07/05/16 1517  TROPIPOC 0.00   Pro B Natriuretic peptide (BNP)  Date/Time Value Ref Range Status  12/31/2012 08:45 PM 49.6 0 - 125 pg/mL Final   Lab Results  Component Value Date   CHOL 154 07/06/2016   HDL 28 (L) 07/06/2016   LDLCALC 104 (H) 07/06/2016   TRIG 109 07/06/2016      ECG:  Sinus rhythm with PVCs Vent. rate 66 BPM PR interval * ms QRS duration 95 ms QT/QTc 415/435 ms P-R-T axes -35 -54 34  Radiology:  Dg Chest Portable 1 View  Result Date: 07/05/2016 CLINICAL DATA:  Post side chest extends into the left shoulder.  EXAM: PORTABLE CHEST 1 VIEW COMPARISON:  12/31/2012 FINDINGS: 1544 hours. The lungs are clear wiithout focal pneumonia, edema, pneumothorax or pleural effusion. Cardiopericardial silhouette is at upper limits of normal for size. The visualized bony structures of the thorax are intact. Telemetry leads overlie the chest. IMPRESSION: Low volumes with basilar atelectasis. Electronically Signed   By: Misty Stanley M.D.   On: 07/05/2016 15:55    ASSESSMENT AND PLAN:     1. Chest pain - Concerning for anginal pain as pain resolved with SL nitro x 2. Could be due to dehydration as he is working outside in heat. He does have hx of CAD with 80-90% PDA stenosis, 50% distal RCA, and 50% mid circumflex--> managed medically. Myoview 06/2015 was normal. LDL 104. Continue ASA, statin and BB. Will discuss plan with MD. Cath vs medical management (if opted will add Imdur).   2. CAD - As above  3. Fatigue - Could be due to working outside in heat vs worsening of CAD vs bradycardia. Telemetry shows sinus rhythm with PACs and PVCs with a rate mostly in 50s to 60s. He did have one episode of SVT yesterday (16 beats).   4. Hypertension - Normal at presentation. However elevated last night.   5. AKI - NS given yesterday. Pending BMET this morning.  6. Leukocytosis - Per primary   Signed: Aldea Avis, PA 07/06/2016, 7:17 AM Pager [phone]  Co-Sign MD

## 2016-07-06 NOTE — Care Management Obs Status (Signed)
[facility] NOTIFICATION   Patient Details  Name: William [name] MRN: [id number] Date of Birth: 1941-03-01   Medicare Observation Status Notification Given:  Yes    Bethena Roys, RN 07/06/2016, 4:28 PM

## 2016-07-06 NOTE — Progress Notes (Addendum)
PROGRESS NOTE        PATIENT DETAILS Name: William [name] [facility] Age: 75 y.o. Sex: male Date of Birth: July 18, 1941 Admit Date: 07/05/2016 Admitting Physician Vianne Bulls, MD PCP: Melinda Crutch, MD  Brief Narrative: Patient is a 75 y.o. male with known history of nonobstructive CAD, on medical management, presented to the ED with chest pain with some features suggestive of unstable angina. Admitted for further evaluation and treatment, seen by cardiology, with plans for left heart catheterization.  Subjective: No further chest pain since admission  Assessment/Plan: Principal Problem: Chest pain: With mostly typical features, concerning for unstable angina. Seen by cardiology, plans are for [facility] today. In the interim, continue with aspirin, statin and beta blocker. Troponins negative so far.  Active Problems: AKI: Likely mild prerenal azotemia, resolved with IV fluids.  HTN (hypertension): Continue with metoprolol, relatively well controlled.  Leukocytosis: Minimal-no indication of infection, continue to monitor off antibiotics.  DVT Prophylaxis: Prophylactic heparin  Code Status: Full code   Family Communication: None at bedside  Disposition Plan: Remain inpatient-home once workup is complete  Antimicrobial agents: None  Procedures: None  CONSULTS:  cardiology  Time spent: 25 minutes-Greater than 50% of this time was spent in counseling, explanation of diagnosis, planning of further management, and coordination of care.  MEDICATIONS: Anti-infectives    None      Scheduled Meds: . aspirin EC  81 mg Oral Daily  . atorvastatin  40 mg Oral q1800  . heparin  5,000 Units Subcutaneous Q8H  . metoprolol  50 mg Oral BID  . nitroGLYCERIN  0.5 inch Topical Q6H  . sodium chloride flush  3 mL Intravenous Q12H   Continuous Infusions: . sodium chloride 125 mL/hr at 07/06/16 1045   PRN Meds:.sodium chloride, acetaminophen, ALPRAZolam, fentaNYL  (SUBLIMAZE) injection, ondansetron (ZOFRAN) IV, sodium chloride flush   PHYSICAL EXAM: Vital signs: Vitals:   07/05/16 2000 07/05/16 2045 07/06/16 0500 07/06/16 1038  BP: 124/78 (!) 160/80 (!) 163/86 (!) 154/76  Pulse: 63 66 60   Resp: 13 18 18    Temp:  97.8 F (36.6 C) 97.5 F (36.4 C)   TempSrc:  Oral Oral   SpO2: 99% 98% 97%   Weight:  96.6 kg (212 lb 15.4 oz) 97.2 kg (214 lb 4.6 oz)   Height:  5\' 9"  (1.753 m)     Filed Weights   07/05/16 1442 07/05/16 2045 07/06/16 0500  Weight: 96.6 kg (213 lb) 96.6 kg (212 lb 15.4 oz) 97.2 kg (214 lb 4.6 oz)   Body mass index is 31.64 kg/m.   General appearance :Awake, alert, not in any distress. Speech Clear. Not toxic Looking Eyes:, pupils equally reactive to light and accomodation,no scleral icterus.Pink conjunctiva HEENT: Atraumatic and Normocephalic Neck: supple, no JVD. No cervical lymphadenopathy. No thyromegaly Resp:Good air entry bilaterally, no added sounds  CVS: S1 S2 regular, no murmurs.  GI: Bowel sounds present, Non tender and not distended with no gaurding, rigidity or rebound.No organomegaly Extremities: B/L Lower Ext shows no edema, both legs are warm to touch Neurology:  speech clear,Non focal, sensation is grossly intact. Psychiatric: Normal judgment and insight. Alert and oriented x 3. Normal mood. Musculoskeletal:gait appears to be normal.No digital cyanosis Skin:No Rash, warm and dry Wounds:N/A  I have personally reviewed following labs and imaging studies  LABORATORY DATA: CBC:  Recent Labs Lab 07/05/16 1445  WBC 12.1*  HGB 15.4  HCT 46.2  MCV 89.9  PLT 0000000    Basic Metabolic Panel:  Recent Labs Lab 07/05/16 1445 07/06/16 0729  NA 142 140  K 3.7 3.7  CL 114* 110  CO2 21* 24  GLUCOSE 102* 101*  BUN 24* 21*  CREATININE 1.64* 1.08  CALCIUM 9.1 8.9  MG  --  2.1    GFR: Estimated Creatinine Clearance: 68 mL/min (by C-G formula based on SCr of 1.08 mg/dL).  Liver Function  Tests:  Recent Labs Lab 07/05/16 1445  AST 24  ALT 27  ALKPHOS 57  BILITOT 1.1  PROT 6.2*  ALBUMIN 3.6   No results for input(s): LIPASE, AMYLASE in the last 168 hours. No results for input(s): AMMONIA in the last 168 hours.  Coagulation Profile:  Recent Labs Lab 07/05/16 1445  INR 1.08    Cardiac Enzymes:  Recent Labs Lab 07/05/16 2114 07/06/16 0152 07/06/16 0729  TROPONINI <0.03 <0.03 <0.03    BNP (last 3 results) No results for input(s): PROBNP in the last 8760 hours.  HbA1C: No results for input(s): HGBA1C in the last 72 hours.  CBG: No results for input(s): GLUCAP in the last 168 hours.  Lipid Profile:  Recent Labs  07/06/16 0152  CHOL 154  HDL 28*  LDLCALC 104*  TRIG 109  CHOLHDL 5.5    Thyroid Function Tests: No results for input(s): TSH, T4TOTAL, FREET4, T3FREE, THYROIDAB in the last 72 hours.  Anemia Panel: No results for input(s): VITAMINB12, FOLATE, FERRITIN, TIBC, IRON, RETICCTPCT in the last 72 hours.  Urine analysis: No results found for: COLORURINE, APPEARANCEUR, LABSPEC, PHURINE, GLUCOSEU, HGBUR, BILIRUBINUR, KETONESUR, PROTEINUR, UROBILINOGEN, NITRITE, LEUKOCYTESUR  Sepsis Labs: Lactic Acid, Venous No results found for: LATICACIDVEN  MICROBIOLOGY: No results found for this or any previous visit (from the past 240 hour(s)).  RADIOLOGY STUDIES/RESULTS: Dg Chest Portable 1 View  Result Date: 07/05/2016 CLINICAL DATA:  Post side chest extends into the left shoulder. EXAM: PORTABLE CHEST 1 VIEW COMPARISON:  12/31/2012 FINDINGS: 1544 hours. The lungs are clear wiithout focal pneumonia, edema, pneumothorax or pleural effusion. Cardiopericardial silhouette is at upper limits of normal for size. The visualized bony structures of the thorax are intact. Telemetry leads overlie the chest. IMPRESSION: Low volumes with basilar atelectasis. Electronically Signed   By: Misty Stanley M.D.   On: 07/05/2016 15:55     LOS: 0 days    Oren Binet, MD  [facility] Pager:[phone]  If 7PM-7AM, please contact night-coverage [email or web address] Password [email or web address] 07/06/2016, 1:33 PM

## 2016-07-06 NOTE — Interval H&P Note (Signed)
Cath Lab Visit (complete for each Cath Lab visit)  Clinical Evaluation Leading to the Procedure:   ACS: Yes.    Non-ACS:    Anginal Classification: CCS IV  Anti-ischemic medical therapy: Minimal Therapy (1 class of medications)  Non-Invasive Test Results: No non-invasive testing performed  Prior CABG: No previous CABG   Unstable angina   History and Physical Interval Note:  07/06/2016 2:11 PM  Marny Lowenstein Ruelas  has presented today for surgery, with the diagnosis of cp  The various methods of treatment have been discussed with the patient and family. After consideration of risks, benefits and other options for treatment, the patient has consented to  Procedure(s): Left Heart Cath and Coronary Angiography (N/A) as a surgical intervention .  The patient's history has been reviewed, patient examined, no change in status, stable for surgery.  I have reviewed the patient's chart and labs.  Questions were answered to the patient's satisfaction.     Larae Grooms

## 2016-07-07 DIAGNOSIS — R079 Chest pain, unspecified: Secondary | ICD-10-CM | POA: Diagnosis not present

## 2016-07-07 DIAGNOSIS — I10 Essential (primary) hypertension: Secondary | ICD-10-CM | POA: Diagnosis not present

## 2016-07-07 LAB — BASIC METABOLIC PANEL
Anion gap: 5 (ref 5–15)
BUN: 13 mg/dL (ref 6–20)
CHLORIDE: 107 mmol/L (ref 101–111)
CO2: 27 mmol/L (ref 22–32)
CREATININE: 1.14 mg/dL (ref 0.61–1.24)
Calcium: 9.2 mg/dL (ref 8.9–10.3)
GFR calc Af Amer: >60 mL/min (ref >60)
GFR calc non Af Amer: >60 mL/min (ref >60)
GLUCOSE: 108 mg/dL — AB (ref 65–99)
POTASSIUM: 3.9 mmol/L (ref 3.5–5.1)
Sodium: 139 mmol/L (ref 135–145)

## 2016-07-07 LAB — ECHOCARDIOGRAM COMPLETE
HEIGHTINCHES: 69 in
Weight: 3428.59 oz

## 2016-07-07 LAB — HEMOGLOBIN A1C
HEMOGLOBIN A1C: 5.8 % — AB (ref 4.8–5.6)
Mean Plasma Glucose: 120 mg/dL

## 2016-07-07 MED ORDER — ATORVASTATIN CALCIUM 40 MG PO TABS: 40.0000 mg | ORAL_TABLET | Freq: Every day | ORAL | 0 refills | Status: DC

## 2016-07-07 NOTE — Plan of Care (Signed)
Problem: Education: Goal: Knowledge of [facility] Education information/materials will improve Outcome: Completed/Met Date Met: 07/07/16 Pt educated throughout entire admission regarding tests, procedures, labs, medications, and available resources.   Problem: Pain Managment: Goal: General experience of comfort will improve Outcome: Completed/Met Date Met: 07/07/16 Pt remains pain free  Problem: Consults Goal: Chest Pain Patient Education (See Patient Education module for education specifics.)  Outcome: Completed/Met Date Met: 07/07/16 Pt received education regarding chest pain   Problem: Phase I Progression Outcomes Goal: MD aware of Cardiac Marker results Outcome: Completed/Met Date Met: 07/07/16 MD aware of cardiac markers  Problem: Phase II Progression Outcomes Goal: Anginal pain relieved Outcome: Completed/Met Date Met: 07/07/16 Pt remains chest pain free Goal: Cath/PCI Day Path if indicated Outcome: Completed/Met Date Met: 07/07/16 Pt went for cardiac cath   Problem: Phase III Progression Outcomes Goal: No anginal pain Outcome: Completed/Met Date Met: 07/07/16 Pt remains free from chest pain  Goal: Vascular site scale level 0 - I Vascular Site Scale Level 0: No bruising/bleeding/hematoma Level I (Mild): Bruising/Ecchymosis, minimal bleeding/ooozing, palpable hematoma < 3 cm Level II (Moderate): Bleeding not affecting hemodynamic parameters, pseudoaneurysm, palpable hematoma > 3 cm Level III  (Severe) Bleeding which affects hemodynamic parameters or retroperitoneal hemorrhage   Outcome: Completed/Met Date Met: 07/07/16 Vascular site level 0 Goal: Tolerating diet Outcome: Completed/Met Date Met: 07/07/16 Pt tolerates current diet   Problem: Discharge Progression Outcomes Goal: No anginal pain Outcome: Completed/Met Date Met: 07/07/16 Pt remains chest pain free Goal: Discharge plan in place and appropriate Outcome: Completed/Met Date Met: 07/07/16 Pt  being discharged home today   Problem: Consults Goal: Cardiac Cath Patient Education (See Patient Education module for education specifics.) Outcome: Completed/Met Date Met: 07/07/16 Pt received information regarding cardiac cath   Problem: Phase I Progression Outcomes Goal: Distal pulses equal to baseline Outcome: Completed/Met Date Met: 07/07/16 Distal pulses are equal to baseline  Goal: Vascular site scale level 0 - I Vascular Site Scale Level 0: No bruising/bleeding/hematoma Level I (Mild): Bruising/Ecchymosis, minimal bleeding/ooozing, palpable hematoma < 3 cm Level II (Moderate): Bleeding not affecting hemodynamic parameters, pseudoaneurysm, palpable hematoma > 3 cm Level III  (Severe) Bleeding which affects hemodynamic parameters or retroperitoneal hemorrhage  Outcome: Completed/Met Date Met: 07/07/16 Vascular site level 0  Problem: Phase II Progression Outcomes Goal: Discharge plan in place and appropriate Outcome: Completed/Met Date Met: 07/07/16 Pt being discharged home today  Goal: Pain controlled with appropriate interventions Outcome: Completed/Met Date Met: 07/07/16 Pt remains free from pain

## 2016-07-07 NOTE — Discharge Summary (Addendum)
Physician Discharge Summary  [name] [id number] DOB: [birth year] DOA: 07/05/2016  PCP:  Melinda Crutch, MD  Admit date: 07/05/2016 Discharge date: 07/07/2016  Admitted From: Home Disposition:  Home  Recommendations for Outpatient Follow-up:  1. Follow up with PCP in 3-5 days 2. Please obtain LFT's in 6 weeks  3. Please follow up on the following pending results:  Home Health: No Discharge Condition: Stable  CODE STATUS: Full  Diet recommendation: Heart healthy  Brief/Interim Summary: Patient is a 75 y.o. male with known history of nonobstructive CAD, on medical management, presented to the ED with chest pain with some features suggestive of unstable angina. Admitted with chest pain r/o ACS. Patient was seen by cardiology, send it for cath which showed 50% right PDA and otherwise non obstructive CAD with normal LVF.   Subjective:  Patient seen and examined in the am, completely asymptomatic. No complaints at this time. No overnight events.   Discharge Diagnoses:  Principal Problem:   Chest pain Active Problems:   HTN (hypertension)   CAD (coronary artery disease)   AKI (acute kidney injury) (HCC)   Leukocytosis   Chest pain, rule out acute myocardial infarction  1. Chest pain, hx of CAD - Angina   - Needs aggressive secondary prevention.  - Continue ASA/BB/statin. - Follow up with cardiology  2. AKI resolved Likely a prerenal azotemia secondary to dehydration  - Monitor BMP in outpatient setting  - Encouraged hydration   3. Hypertension - At goal currently, will continue home medications    Discharge Instructions  Discharge Instructions    Diet - low sodium heart healthy    Complete by:  As directed    Discharge instructions    Complete by:  As directed    Increase activity slowly    Complete by:  As directed        Medication List    STOP taking these medications   HYDROcodone-acetaminophen 10-325 MG tablet Commonly known as:  NORCO    phenazopyridine 200 MG tablet Commonly known as:  PYRIDIUM     TAKE these medications   aspirin EC 81 MG tablet Take 81 mg by mouth daily.   atorvastatin 40 MG tablet Commonly known as:  LIPITOR Take 1 tablet (40 mg total) by mouth daily at 6 PM.   metoprolol 50 MG tablet Commonly known as:  LOPRESSOR Take 1 tablet (50 mg total) by mouth 2 (two) times daily.   sulfamethoxazole-trimethoprim 800-160 MG tablet Commonly known as:  BACTRIM DS,SEPTRA DS Take 1 tablet by mouth 2 (two) times daily.       Allergies  Allergen Reactions  . Codeine Nausea And Vomiting and Other (See Comments)    "deathly sick"    Consultations: Cardiology, Dr. Radford Pax     Procedures/Studies: Dg Chest Portable 1 View  Result Date: 07/05/2016 CLINICAL DATA:  Post side chest extends into the left shoulder. EXAM: PORTABLE CHEST 1 VIEW COMPARISON:  12/31/2012 FINDINGS: 1544 hours. The lungs are clear wiithout focal pneumonia, edema, pneumothorax or pleural effusion. Cardiopericardial silhouette is at upper limits of normal for size. The visualized bony structures of the thorax are intact. Telemetry leads overlie the chest. IMPRESSION: Low volumes with basilar atelectasis. Electronically Signed   By: Misty Stanley M.D.   On: 07/05/2016 15:55    ECHO - Left ventricle: The cavity size was normal. Systolic function was   normal. The estimated ejection fraction was in the range of 55%   to 60%. Wall motion was normal;  there were no regional wall   motion abnormalities. Left ventricular diastolic function   parameters were normal.- Aortic valve: Trileaflet; mildly thickened, mildly calcified   leaflets.- Mitral valve: There was mild regurgitation  Discharge Exam: Vitals:   07/06/16 2028 07/07/16 0500  BP: 135/74 (!) 153/77  Pulse: 66   Resp: 16 16  Temp: 98 F (36.7 C) 98.2 F (36.8 C)   Vitals:   07/06/16 1510 07/06/16 1525 07/06/16 2028 07/07/16 0500  BP: (!) 150/87 (!) 150/90 135/74 (!) 153/77   Pulse: (!) 54 66 66   Resp:   16 16  Temp:   98 F (36.7 C) 98.2 F (36.8 C)  TempSrc:   Oral Oral  SpO2: 97% 98% 96% 98%  Weight:    95.4 kg (210 lb 4.8 oz)  Height:        General: Pt is alert, awake, not in acute distress Cardiovascular: RRR, S1/S2 +, no rubs, no gallops Respiratory: CTA bilaterally, no wheezing, no rhonchi Abdominal: Soft, NT, ND, bowel sounds + Extremities: no edema, no cyanosis    The results of significant diagnostics from this hospitalization (including imaging, microbiology, ancillary and laboratory) are listed below for reference.     Microbiology: No results found for this or any previous visit (from the past 240 hour(s)).   Labs: BNP (last 3 results) No results for input(s): BNP in the last 8760 hours. Basic Metabolic Panel:  Recent Labs Lab 07/05/16 1445 07/06/16 0729 07/07/16 0209  NA 142 140 139  K 3.7 3.7 3.9  CL 114* 110 107  CO2 21* 24 27  GLUCOSE 102* 101* 108*  BUN 24* 21* 13  CREATININE 1.64* 1.08 1.14  CALCIUM 9.1 8.9 9.2  MG  --  2.1  --    Liver Function Tests:  Recent Labs Lab 07/05/16 1445  AST 24  ALT 27  ALKPHOS 57  BILITOT 1.1  PROT 6.2*  ALBUMIN 3.6   No results for input(s): LIPASE, AMYLASE in the last 168 hours. No results for input(s): AMMONIA in the last 168 hours. CBC:  Recent Labs Lab 07/05/16 1445  WBC 12.1*  HGB 15.4  HCT 46.2  MCV 89.9  PLT 158   Cardiac Enzymes:  Recent Labs Lab 07/05/16 2114 07/06/16 0152 07/06/16 0729  TROPONINI <0.03 <0.03 <0.03   BNP: Invalid input(s): POCBNP CBG: No results for input(s): GLUCAP in the last 168 hours. D-Dimer  Recent Labs  07/06/16 1647  DDIMER 0.36   Hgb A1c  Recent Labs  07/06/16 0152  HGBA1C 5.8*   Lipid Profile  Recent Labs  07/06/16 0152  CHOL 154  HDL 28*  LDLCALC 104*  TRIG 109  CHOLHDL 5.5   Thyroid function studies No results for input(s): TSH, T4TOTAL, T3FREE, THYROIDAB in the last 72 hours.  Invalid  input(s): FREET3 Anemia work up No results for input(s): VITAMINB12, FOLATE, FERRITIN, TIBC, IRON, RETICCTPCT in the last 72 hours. Urinalysis No results found for: COLORURINE, APPEARANCEUR, LABSPEC, [place], GLUCOSEU, HGBUR, BILIRUBINUR, KETONESUR, PROTEINUR, UROBILINOGEN, NITRITE, LEUKOCYTESUR Sepsis Labs Invalid input(s): PROCALCITONIN,  WBC,  LACTICIDVEN Microbiology No results found for this or any previous visit (from the past 240 hour(s)).   Time coordinating discharge:  30 minutes  SIGNED:   Doreatha Lew, MD  [facility] 07/07/2016, 11:13 AM Pager   If 7PM-7AM, please contact night-coverage [email or web address] Password [email or web address]

## 2016-07-07 NOTE — Discharge Instructions (Signed)
Chest Pain Observation °It is often hard to give a specific diagnosis for the cause of chest pain. Among other possibilities your symptoms might be caused by inadequate oxygen delivery to your heart (angina). Angina that is not treated or evaluated can lead to a heart attack (myocardial infarction) or death. °Blood tests, electrocardiograms, and X-rays may have been done to help determine a possible cause of your chest pain. After evaluation and observation, your health care provider has determined that it is unlikely your pain was caused by an unstable condition that requires hospitalization. However, a full evaluation of your pain may need to be completed, with additional diagnostic testing as directed. It is very important to keep your follow-up appointments. Not keeping your follow-up appointments could result in permanent heart damage, disability, or death. If there is any problem keeping your follow-up appointments, you must call your health care provider. °HOME CARE INSTRUCTIONS  °Due to the slight chance that your pain could be angina, it is important to follow your health care provider's treatment plan and also maintain a healthy lifestyle: °· Maintain or work toward achieving a healthy weight. °· Stay physically active and exercise regularly. °· Decrease your salt intake. °· Eat a balanced, healthy diet. Talk to a dietitian to learn about heart-healthy foods. °· Increase your fiber intake by including whole grains, vegetables, fruits, and nuts in your diet. °· Avoid situations that cause stress, anger, or depression. °· Take medicines as advised by your health care provider. Report any side effects to your health care provider. Do not stop medicines or adjust the dosages on your own. °· Quit smoking. Do not use nicotine patches or gum until you check with your health care provider. °· Keep your blood pressure, blood sugar, and cholesterol levels within normal limits. °· Limit alcohol intake to no more than  1 drink per day for women who are not pregnant and 2 drinks per day for men. °· Do not abuse drugs. °SEEK IMMEDIATE MEDICAL CARE IF: °You have severe chest pain or pressure which may include symptoms such as: °· You feel pain or pressure in your arms, neck, jaw, or back. °· You have severe back or abdominal pain, feel sick to your stomach (nauseous), or throw up (vomit). °· You are sweating profusely. °· You are having a fast or irregular heartbeat. °· You feel short of breath while at rest. °· You notice increasing shortness of breath during rest, sleep, or with activity. °· You have chest pain that does not get better after rest or after taking your usual medicine. °· You wake from sleep with chest pain. °· You are unable to sleep because you cannot breathe. °· You develop a frequent cough or you are coughing up blood. °· You feel dizzy, faint, or experience extreme fatigue. °· You develop severe weakness, dizziness, fainting, or chills. °Any of these symptoms may represent a serious problem that is an emergency. Do not wait to see if the symptoms will go away. Call your local emergency services (911 in the [place] Do not drive yourself to the hospital. °MAKE SURE YOU: °· Understand these instructions. °· Will watch your condition. °· Will get help right away if you are not doing well or get worse. °  °This information is not intended to replace advice given to you by your health care provider. Make sure you discuss any questions you have with your health care provider. °  °Document Released: 11/10/2010 Document Revised: 10/13/2013 Document Reviewed: 04/09/2013 °Elsevier Interactive Patient   Education 2016 [employer].   Aspirin and Your Heart  Aspirin is a medicine that affects the way blood clots. Aspirin can be used to help reduce the risk of blood clots, heart attacks, and other heart-related problems.  SHOULD I TAKE ASPIRIN? Your health care provider will help you determine whether it is safe and  beneficial for you to take aspirin daily. Taking aspirin daily may be beneficial if you:  Have had a heart attack or chest pain.  Have undergone open heart surgery such as coronary artery bypass surgery (CABG).  Have had coronary angioplasty.  Have experienced a stroke or transient ischemic attack (TIA).  Have peripheral vascular disease (PVD).  Have chronic heart rhythm problems such as atrial fibrillation. ARE THERE ANY RISKS OF TAKING ASPIRIN DAILY? Daily use of aspirin can increase your risk of side effects. Some of these include:  Bleeding. Bleeding problems can be minor or serious. An example of a minor problem is a cut that does not stop bleeding. An example of a more serious problem is stomach bleeding or bleeding into the brain. Your risk of bleeding is increased if you are also taking non-steroidal anti-inflammatory medicine (NSAIDs).  Increased bruising.  Upset stomach.  An allergic reaction. People who have nasal polyps have an increased risk of developing an aspirin allergy. WHAT ARE SOME GUIDELINES I SHOULD FOLLOW WHEN TAKING ASPIRIN?   Take aspirin only as directed by your health care provider. Make sure you understand how much you should take and what form you should take. The two forms of aspirin are:  Non-enteric-coated. This type of aspirin does not have a coating and is absorbed quickly. Non-enteric-coated aspirin is usually recommended for people with chest pain. This type of aspirin also comes in a chewable form.  Enteric-coated. This type of aspirin has a special coating that releases the medicine very slowly. Enteric-coated aspirin causes less stomach upset than non-enteric-coated aspirin. This type of aspirin should not be chewed or crushed.  Drink alcohol in moderation. Drinking alcohol increases your risk of bleeding. WHEN SHOULD I SEEK MEDICAL CARE?   You have unusual bleeding or bruising.  You have stomach pain.  You have an allergic reaction.  Symptoms of an allergic reaction include:  Hives.  Itchy skin.  Swelling of the lips, tongue, or face.  You have ringing in your ears. WHEN SHOULD I SEEK IMMEDIATE MEDICAL CARE?   Your bowel movements are bloody, dark red, or black in color.  You vomit or cough up blood.  You have blood in your urine.  You cough, wheeze, or feel short of breath. If you have any of the following symptoms, this is an emergency. Do not wait to see if the pain will go away. Get medical help at once. Call your local emergency services (911 in the [place] Do not drive yourself to the hospital.  You have severe chest pain, especially if the pain is crushing or pressure-like and spreads to the arms, back, neck, or jaw.  You have stroke-like symptoms, such as:   Loss of vision.   Difficulty talking.   Numbness or weakness on one side of your body.   Numbness or weakness in your arm or leg.   Not thinking clearly or feeling confused.    This information is not intended to replace advice given to you by your health care provider. Make sure you discuss any questions you have with your health care provider.   Document Released: 09/20/2008 Document Revised: 10/29/2014 Document Reviewed:  01/13/2014 Elsevier Interactive Patient Education [employer].

## 2016-07-09 ENCOUNTER — Encounter (HOSPITAL_COMMUNITY): Payer: Self-pay | Admitting: Interventional Cardiology

## 2016-07-18 ENCOUNTER — Other Ambulatory Visit: Payer: Self-pay | Admitting: Family Medicine

## 2016-07-18 ENCOUNTER — Encounter (HOSPITAL_COMMUNITY): Payer: Self-pay

## 2016-07-18 ENCOUNTER — Observation Stay (HOSPITAL_COMMUNITY)
Admission: EM | Admit: 2016-07-18 | Discharge: 2016-07-19 | Disposition: A | Discharge status: Home / Self Care | Payer: Medicare Other | Attending: Emergency Medicine | Admitting: Emergency Medicine

## 2016-07-18 ENCOUNTER — Emergency Department (HOSPITAL_COMMUNITY): Payer: Medicare Other

## 2016-07-18 DIAGNOSIS — R109 Unspecified abdominal pain: Secondary | ICD-10-CM

## 2016-07-18 DIAGNOSIS — E785 Hyperlipidemia, unspecified: Secondary | ICD-10-CM | POA: Diagnosis not present

## 2016-07-18 DIAGNOSIS — I2510 Atherosclerotic heart disease of native coronary artery without angina pectoris: Secondary | ICD-10-CM | POA: Diagnosis not present

## 2016-07-18 DIAGNOSIS — R1084 Generalized abdominal pain: Principal | ICD-10-CM | POA: Insufficient documentation

## 2016-07-18 DIAGNOSIS — Z7982 Long term (current) use of aspirin: Secondary | ICD-10-CM | POA: Insufficient documentation

## 2016-07-18 DIAGNOSIS — E7800 Pure hypercholesterolemia, unspecified: Secondary | ICD-10-CM | POA: Insufficient documentation

## 2016-07-18 DIAGNOSIS — I10 Essential (primary) hypertension: Secondary | ICD-10-CM | POA: Diagnosis not present

## 2016-07-18 DIAGNOSIS — Z87891 Personal history of nicotine dependence: Secondary | ICD-10-CM | POA: Insufficient documentation

## 2016-07-18 DIAGNOSIS — Z79899 Other long term (current) drug therapy: Secondary | ICD-10-CM | POA: Diagnosis not present

## 2016-07-18 DIAGNOSIS — K625 Hemorrhage of anus and rectum: Secondary | ICD-10-CM | POA: Diagnosis present

## 2016-07-18 LAB — COMPREHENSIVE METABOLIC PANEL
ALBUMIN: 4.4 g/dL (ref 3.5–5.0)
ALT: 29 U/L (ref 17–63)
AST: 22 U/L (ref 15–41)
Alkaline Phosphatase: 62 U/L (ref 38–126)
Anion gap: 9 (ref 5–15)
BUN: 23 mg/dL — AB (ref 6–20)
CHLORIDE: 105 mmol/L (ref 101–111)
CO2: 23 mmol/L (ref 22–32)
Calcium: 9.8 mg/dL (ref 8.9–10.3)
Creatinine, Ser: 1.19 mg/dL (ref 0.61–1.24)
GFR calc Af Amer: >60 mL/min (ref >60)
GFR calc non Af Amer: 58 mL/min — ABNORMAL LOW (ref >60)
GLUCOSE: 143 mg/dL — AB (ref 65–99)
Potassium: 3.9 mmol/L (ref 3.5–5.1)
SODIUM: 137 mmol/L (ref 135–145)
Total Bilirubin: 1.1 mg/dL (ref 0.3–1.2)
Total Protein: 7.7 g/dL (ref 6.5–8.1)

## 2016-07-18 LAB — I-STAT CG4 LACTIC ACID, ED: Lactic Acid, Venous: 1.5 mmol/L (ref 0.5–1.9)

## 2016-07-18 LAB — CBC
HEMATOCRIT: 48.6 % (ref 39.0–52.0)
Hemoglobin: 16.7 g/dL (ref 13.0–17.0)
MCH: 30.3 pg (ref 26.0–34.0)
MCHC: 34.4 g/dL (ref 30.0–36.0)
MCV: 88 fL (ref 78.0–100.0)
Platelets: 196 10*3/uL (ref 150–400)
RBC: 5.52 MIL/uL (ref 4.22–5.81)
RDW: 13.2 % (ref 11.5–15.5)
WBC: 11.8 10*3/uL — AB (ref 4.0–10.5)

## 2016-07-18 LAB — URINE MICROSCOPIC-ADD ON
Bacteria, UA: NONE SEEN
WBC, UA: NONE SEEN WBC/hpf (ref 0–5)

## 2016-07-18 LAB — URINALYSIS, ROUTINE W REFLEX MICROSCOPIC
GLUCOSE, UA: NEGATIVE mg/dL
KETONES UR: NEGATIVE mg/dL
LEUKOCYTES UA: NEGATIVE
Nitrite: NEGATIVE
PH: 5 (ref 5.0–8.0)
Protein, ur: 30 mg/dL — AB
Specific Gravity, Urine: 1.031 — ABNORMAL HIGH (ref 1.005–1.030)

## 2016-07-18 LAB — LIPASE, BLOOD: LIPASE: 28 U/L (ref 11–51)

## 2016-07-18 MED ORDER — FAMOTIDINE 20 MG PO TABS: 20.0000 mg | ORAL_TABLET | Freq: Two times a day (BID) | ORAL | 0 refills | Status: DC

## 2016-07-18 MED ORDER — ONDANSETRON 4 MG PO TBDP: 4.0000 mg | ORAL_TABLET | Freq: Three times a day (TID) | ORAL | 0 refills | Status: DC | PRN

## 2016-07-18 MED ORDER — IOPAMIDOL (ISOVUE-300) INJECTION 61%
100.0000 mL | Freq: Once | INTRAVENOUS | Status: AC | PRN
  Administered 2016-07-18: 100 mL via INTRAVENOUS

## 2016-07-18 MED ORDER — FENTANYL CITRATE (PF) 100 MCG/2ML IJ SOLN
100.0000 ug | Freq: Once | INTRAMUSCULAR | Status: AC
  Administered 2016-07-18: 100 ug via INTRAVENOUS
  Filled 2016-07-18: qty 2

## 2016-07-18 MED ORDER — ONDANSETRON HCL 4 MG/2ML IJ SOLN
4.0000 mg | Freq: Once | INTRAMUSCULAR | Status: AC
  Administered 2016-07-18: 4 mg via INTRAVENOUS
  Filled 2016-07-18: qty 2

## 2016-07-18 MED ORDER — PANTOPRAZOLE SODIUM 40 MG PO TBEC
40.0000 mg | DELAYED_RELEASE_TABLET | Freq: Every day | ORAL | 0 refills | Status: DC
Start: 2016-07-18 — End: 2016-07-30

## 2016-07-18 MED ORDER — IOPAMIDOL (ISOVUE-300) INJECTION 61%
30.0000 mL | Freq: Once | INTRAVENOUS | Status: AC | PRN
  Administered 2016-07-18: 30 mL via ORAL

## 2016-07-18 MED ORDER — SODIUM CHLORIDE 0.9 % IV BOLUS (SEPSIS)
1000.0000 mL | Freq: Once | INTRAVENOUS | Status: AC
  Administered 2016-07-18: 1000 mL via INTRAVENOUS

## 2016-07-18 MED ORDER — HYDROCODONE-ACETAMINOPHEN 5-325 MG PO TABS: 1.0000 | ORAL_TABLET | ORAL | 0 refills | Status: DC | PRN

## 2016-07-18 MED ORDER — IOPAMIDOL (ISOVUE-300) INJECTION 61%: 100.0000 mL | Freq: Once | INTRAVENOUS | Status: DC | PRN

## 2016-07-18 NOTE — ED Triage Notes (Signed)
Pt complaining of generalized abdominal pain starting on Saturday night. Pt saw his PCP yesterday. MD scheduled a ultrasound of his pancreas. Pt states that he is unable to eat or drink due to pain. Denies diarrhea. A&Ox4.

## 2016-07-18 NOTE — ED Notes (Signed)
Patient transported to CT 

## 2016-07-18 NOTE — ED Provider Notes (Signed)
[facility] DEPT Provider Note   CSN: [id number] Arrival date & time: 07/18/16  1949     History   Chief Complaint Chief Complaint  Patient presents with  . Abdominal Pain    HPI William [name] is a 75 y.o. male.  HPI  75 year old male presents with abdominal pain for the past 5 days. Points to just above his umbilicus as the source of pain and it radiates diffusely over his abdomen. Has had nausea without vomiting. Has been having normal bowel movements. Gave himself an enema a couple days ago thinking it might be constipation but this did not change his symptoms. Pain feels like a tightening and squeezing. There is no chest pain or shortness of breath. Is a recently had a reassuring cardiac catheterization 12 days ago. Pain is significantly worse after eating only a few bites of food. No fevers. No urinary symptoms.  Past Medical History:  Diagnosis Date  . BPH (benign prostatic hyperplasia)   . CAD (coronary artery disease) cardiologist-  dr Daneen Schick   with intermediate obstruction in the distal RCA and PDA by catheter 2013, Dr Tamala Julian  . Full dentures   . History of colon polyps   . History of kidney stones    1970's  . HTN (hypertension)   . Hyperlipidemia   . Lower urinary tract symptoms (LUTS)   . Osteoarthritis    thumbs  . Sciatica   . Wears glasses     Patient Active Problem List   Diagnosis Date Noted  . Rectal bleeding 07/18/2016  . Chest pain, rule out acute myocardial infarction   . Chest pain 07/05/2016  . AKI (acute kidney injury) ([place]) 07/05/2016  . Leukocytosis 07/05/2016  . Enlarged prostate with urinary obstruction 07/04/2015  . Pure hypercholesterolemia   . Osteoarthritis   . Back pain   . Sciatica   . Osteoarthrosis   . Neuropathy ([place])   . Angina of effort ([place])   . Hyperlipidemia   . HTN (hypertension)   . Colon polyp   . CAD (coronary artery disease)   . Angina pectoris, crescendo ([place]) 12/31/2012    Class: Acute    Past  Surgical History:  Procedure Laterality Date  . ANTERIOR CERVICAL DECOMP/DISCECTOMY FUSION  05-24-2009   C3 -- C7  . APPENDECTOMY  age 73  . CARDIAC CATHETERIZATION N/A 07/06/2016   Procedure: Left Heart Cath and Coronary Angiography;  Surgeon: Jettie Booze, MD;  Location: [facility];  Service: Cardiovascular;  Laterality: N/A;  . CARDIOVASCULAR STRESS TEST  06-23-2015  dr Daneen Schick   normal lexiscan study/  no perfusion defects or ischemia/  normal LV function and wall motion , ef 57%  . LEFT HEART CATHETERIZATION WITH CORONARY ANGIOGRAM N/A 01/01/2013   Procedure: LEFT HEART CATHETERIZATION WITH CORONARY ANGIOGRAM;  Surgeon: Sinclair Grooms, MD;  Location: [facility];  Service: Cardiovascular;  Laterality: N/A;   Intermediate stenosis in  proximal PDA 50-70%;  dRCA 50%;  ostial and mid CFX 50%;  widely patent LAD;  normal LVF, ef 60%  . LUMBAR SPINE SURGERY  1990's  . TRANSURETHRAL RESECTION OF PROSTATE N/A 07/04/2015   Procedure: TRANSURETHRAL RESECTION OF THE PROSTATE WITH GYRUS INSTRUMENTS;  Surgeon: Franchot Gallo, MD;  Location: [facility];  Service: Urology;  Laterality: N/A;       Home Medications    Prior to Admission medications   Medication Sig Start Date End Date Taking? Authorizing Provider  aspirin EC 81  MG tablet Take 81 mg by mouth daily.   Yes Historical Provider, MD  metoprolol (LOPRESSOR) 50 MG tablet Take 1 tablet (50 mg total) by mouth 2 (two) times daily. 01/01/13  Yes Belva Crome, MD  atorvastatin (LIPITOR) 40 MG tablet Take 1 tablet (40 mg total) by mouth daily at 6 PM. Patient not taking: Reported on 07/18/2016 07/07/16   Doreatha Lew, MD  famotidine (PEPCID) 20 MG tablet Take 1 tablet (20 mg total) by mouth 2 (two) times daily. 07/18/16   Sherwood Gambler, MD  HYDROcodone-acetaminophen (NORCO) 5-325 MG tablet Take 1-2 tablets by mouth every 4 (four) hours as needed. 07/18/16   Sherwood Gambler, MD  ondansetron (ZOFRAN ODT) 4  MG disintegrating tablet Take 1 tablet (4 mg total) by mouth every 8 (eight) hours as needed for nausea or vomiting. 07/18/16   Sherwood Gambler, MD  pantoprazole (PROTONIX) 40 MG tablet Take 1 tablet (40 mg total) by mouth daily. 07/18/16   Sherwood Gambler, MD  sulfamethoxazole-trimethoprim (BACTRIM DS,SEPTRA DS) 800-160 MG per tablet Take 1 tablet by mouth 2 (two) times daily. Patient not taking: Reported on 07/18/2016 07/04/15   Franchot Gallo, MD    Family History Family History  Problem Relation Age of Onset  . Dementia Father   . Heart attack      Social History Social History  Substance Use Topics  . Smoking status: Former Smoker    Years: 12.00    Types: Cigarettes    Quit date: 06/27/1964  . Smokeless tobacco: Never Used  . Alcohol use No     Allergies   Codeine and Lipitor [atorvastatin]   Review of Systems Review of Systems  Constitutional: Negative for fever.  Respiratory: Negative for shortness of breath.   Cardiovascular: Negative for chest pain.  Gastrointestinal: Positive for abdominal pain, nausea and vomiting. Negative for constipation and diarrhea.  Genitourinary: Negative for dysuria.  All other systems reviewed and are negative.    Physical Exam Updated Vital Signs BP 105/68   Pulse 68   Temp 97.9 F (36.6 C) (Oral)   Resp 18   SpO2 92%   Physical Exam  Constitutional: He is oriented to person, place, and time. He appears well-developed and well-nourished. No distress.  HENT:  Head: Normocephalic and atraumatic.  Right Ear: External ear normal.  Left Ear: External ear normal.  Nose: Nose normal.  Eyes: Right eye exhibits no discharge. Left eye exhibits no discharge.  Neck: Neck supple.  Cardiovascular: Normal rate, regular rhythm and normal heart sounds.   Pulmonary/Chest: Effort normal and breath sounds normal.  Abdominal: Soft. He exhibits no distension. There is tenderness (diffuse). There is guarding.  Musculoskeletal: He exhibits no  edema.  Neurological: He is alert and oriented to person, place, and time.  Skin: Skin is warm and dry. He is not diaphoretic.  Nursing note and vitals reviewed.    ED Treatments / Results  Labs (all labs ordered are listed, but only abnormal results are displayed) Labs Reviewed  COMPREHENSIVE METABOLIC PANEL - Abnormal; Notable for the following:       Result Value   Glucose, Bld 143 (*)    BUN 23 (*)    GFR calc non Af Amer 58 (*)    All other components within normal limits  CBC - Abnormal; Notable for the following:    WBC 11.8 (*)    All other components within normal limits  URINALYSIS, ROUTINE W REFLEX MICROSCOPIC (NOT AT [facility]) - Abnormal; Notable for  the following:    Color, Urine AMBER (*)    APPearance CLOUDY (*)    Specific Gravity, Urine 1.031 (*)    Hgb urine dipstick SMALL (*)    Bilirubin Urine SMALL (*)    Protein, ur 30 (*)    All other components within normal limits  URINE MICROSCOPIC-ADD ON - Abnormal; Notable for the following:    Squamous Epithelial / LPF 0-5 (*)    All other components within normal limits  LIPASE, BLOOD  I-STAT CG4 LACTIC ACID, ED    EKG  EKG Interpretation  Date/Time:  Wednesday July 18 2016 23:50:16 EDT Ventricular Rate:  63 PR Interval:    QRS Duration: 100 QT Interval:  435 QTC Calculation: 446 R Axis:   -46 Text Interpretation:  Sinus rhythm LAD, consider left anterior fascicular block no significant change since Sept 15 2017 Confirmed by Regenia Skeeter MD, Kerry Odonohue [phone]) on 07/18/2016 11:53:41 PM       Radiology Ct Abdomen Pelvis W Contrast  Result Date: 07/18/2016 CLINICAL DATA:  Acute onset of generalized abdominal pain. Initial encounter. EXAM: CT ABDOMEN AND PELVIS WITH CONTRAST TECHNIQUE: Multidetector CT imaging of the abdomen and pelvis was performed using the standard protocol following bolus administration of intravenous contrast. CONTRAST:  177mL ISOVUE-300 IOPAMIDOL (ISOVUE-300) INJECTION 61% COMPARISON:   None. FINDINGS: Lower chest: Minimal bibasilar atelectasis is noted. The visualized lung bases are otherwise clear. Scattered coronary artery calcifications are seen. Hepatobiliary: Tiny hypodensities are seen within the liver, measuring up to 8 mm in size. These are nonspecific. The spleen is unremarkable in appearance. The patient is status post cholecystectomy, with clips noted at the gallbladder fossa. The common bile duct remains normal in caliber. Pancreas: The pancreas is within normal limits. Spleen: The spleen is unremarkable in appearance. Adrenals/Urinary Tract: The adrenal glands are unremarkable in appearance. Small bilateral renal cysts are seen. There is no evidence of hydronephrosis. No renal or ureteral stones are identified. Nonspecific perinephric stranding is noted bilaterally. Stomach/Bowel: The stomach is unremarkable in appearance. The small bowel is within normal limits. The patient is status post appendectomy. Mild diverticulosis is noted along the proximal sigmoid colon, without evidence of diverticulitis. Vascular/Lymphatic: Scattered calcification is seen along the abdominal aorta and its branches. The abdominal aorta is otherwise grossly unremarkable. The inferior vena cava is grossly unremarkable. No retroperitoneal lymphadenopathy is seen. No pelvic sidewall lymphadenopathy is identified. Reproductive: The prostate is enlarged, measuring 5.4 cm in transverse dimension. The bladder is mildly distended and unremarkable in appearance. Other: No additional soft tissue abnormalities are seen. Musculoskeletal: No acute osseous abnormalities are identified. The visualized musculature is unremarkable in appearance. IMPRESSION: 1. No acute abnormality seen within the abdomen or pelvis. 2. Tiny nonspecific hypodensities within the liver, measuring up to 8 mm in size. 3. Scattered coronary artery calcifications seen. 4. Small bilateral renal cysts seen. 5. Mild diverticulosis along the proximal  sigmoid colon, without evidence of diverticulitis. 6. Enlarged prostate noted. Electronically Signed   By: Garald Balding M.D.   On: 07/18/2016 23:37    Procedures Procedures (including critical care time)  Medications Ordered in ED Medications  sodium chloride 0.9 % bolus 1,000 mL (1,000 mLs Intravenous New Bag/Given 07/18/16 2135)  fentaNYL (SUBLIMAZE) injection 100 mcg (100 mcg Intravenous Given 07/18/16 2138)  ondansetron (ZOFRAN) injection 4 mg (4 mg Intravenous Given 07/18/16 2136)  iopamidol (ISOVUE-300) 61 % injection 30 mL (30 mLs Oral Contrast Given 07/18/16 2224)  iopamidol (ISOVUE-300) 61 % injection 100 mL (100 mLs Intravenous  Contrast Given 07/18/16 2223)     Initial Impression / Assessment and Plan / ED Course  I have reviewed the triage vital signs and the nursing notes.  Pertinent labs & imaging results that were available during my care of the patient were reviewed by me and considered in my medical decision making (see chart for details).  Clinical Course  Comment By Time  Will get CT. IV fentanyl and zofran.  Sherwood Gambler, MD 09/27 2122    Patient is feeling better. CT is unremarkable. Given his symptoms occur worse with eating, will treat with a PPI, H2 blocker, and pain medicine. Will refer to GI as well as his PCP. Discussed strict return per cautions. I have low suspicion for atypical ACS.  Final Clinical Impressions(s) / ED Diagnoses   Final diagnoses:  Abdominal pain, unspecified abdominal location    New Prescriptions New Prescriptions   FAMOTIDINE (PEPCID) 20 MG TABLET    Take 1 tablet (20 mg total) by mouth 2 (two) times daily.   HYDROCODONE-ACETAMINOPHEN (NORCO) 5-325 MG TABLET    Take 1-2 tablets by mouth every 4 (four) hours as needed.   ONDANSETRON (ZOFRAN ODT) 4 MG DISINTEGRATING TABLET    Take 1 tablet (4 mg total) by mouth every 8 (eight) hours as needed for nausea or vomiting.   PANTOPRAZOLE (PROTONIX) 40 MG TABLET    Take 1 tablet (40 mg  total) by mouth daily.     Sherwood Gambler, MD 07/18/16 [phone]

## 2016-07-19 ENCOUNTER — Encounter: Payer: Self-pay | Admitting: Physician Assistant

## 2016-07-19 ENCOUNTER — Encounter: Payer: Self-pay | Admitting: Gastroenterology

## 2016-07-19 NOTE — ED Notes (Signed)
PT DISCHARGED. INSTRUCTIONS AND PRESCRIPTIONS GIVEN. AAOX4. PT IN NO APPARENT DISTRESS OR PAIN. THE OPPORTUNITY TO ASK QUESTIONS WAS PROVIDED. 

## 2016-07-30 ENCOUNTER — Ambulatory Visit (INDEPENDENT_AMBULATORY_CARE_PROVIDER_SITE_OTHER): Payer: Medicare Other | Admitting: Physician Assistant

## 2016-07-30 ENCOUNTER — Encounter: Payer: Self-pay | Admitting: Physician Assistant

## 2016-07-30 VITALS — BP 138/76 | HR 60 | Ht 69.0 in | Wt 217.0 lb

## 2016-07-30 DIAGNOSIS — K2900 Acute gastritis without bleeding: Secondary | ICD-10-CM | POA: Diagnosis not present

## 2016-07-30 DIAGNOSIS — R110 Nausea: Secondary | ICD-10-CM

## 2016-07-30 DIAGNOSIS — R109 Unspecified abdominal pain: Secondary | ICD-10-CM

## 2016-07-30 MED ORDER — PANTOPRAZOLE SODIUM 40 MG PO TBEC: 40.0000 mg | DELAYED_RELEASE_TABLET | Freq: Every day | ORAL | 1 refills | Status: DC

## 2016-07-30 NOTE — Patient Instructions (Signed)
  We have sent the following medications to your pharmacy for you to pick up at your convenience: Protonix   We will obtain your records from [facility] for review.     Call us back with any signs of recurrent symptoms.     I appreciate the opportunity to care for you.

## 2016-07-30 NOTE — Progress Notes (Addendum)
Subjective:    Patient ID: William [name] [facility], male    DOB: 11-28-1940, 75 y.o.   MRN: [id number]  HPI  William [name] is a pleasant 75 year old white male, new to GI today referred by [facility] physician Dr. Sherwood Gambler for evaluation of upper abdominal pain. His primary physician is Dr. Lona Kettle. Patient relates prior colonoscopy being done at [facility] approximately 10 years ago. His wife believes that he has had polyps. He is status post cholecystectomy and appendectomy. Patient had an overnight admission in mid September with chest pain and ruled out for acute coronary syndrome. He did undergo cardiac catheterization with finding of a 50% lesion in the right PDA otherwise nonobstructive disease with normal LV function. Patient developed acute abdominal pain about 5 days prior to ER visit on 07/18/2016. He describes this as a constant [place] not tight feeling in his upper abdomen which radiated into his mid abdomen. The pain was fairly constant in was associated with nausea but no vomiting. He says he wasn't able to eat much during that period of time and was miserable. He had no associated fever or chills no diarrhea melena or hematochezia. He had not been on any NSAIDs or any other new medications. Labs in the ER showed WBC of 11.8 hemoglobin 16 hematocrit of 48 lipase was normal BUN 23 creatinine 1.15 and liver function studies within normal limits. CT of the abdomen and pelvis was done with contrast showing him to be status post cholecystectomy, there were tiny nonspecific hypodensities within the liver sigmoid diverticulosis noted. Patient was given a 2 week prescription for Protonix 40 mg by mouth every morning. He has finished that prescription and says that he feels much better. He really does not have any residual symptoms at this time. His appetite is good ,eating without difficulty. He denies any problems with changes in bowel habits   Review of Systems Pertinent positive and  negative review of systems were noted in the above HPI section.  All other review of systems was otherwise negative.  Outpatient Encounter Prescriptions as of 07/30/2016  Medication Sig  . aspirin EC 81 MG tablet Take 81 mg by mouth daily.  . metoprolol (LOPRESSOR) 50 MG tablet Take 1 tablet (50 mg total) by mouth 2 (two) times daily.  . pantoprazole (PROTONIX) 40 MG tablet Take 1 tablet (40 mg total) by mouth daily.  . [DISCONTINUED] atorvastatin (LIPITOR) 40 MG tablet Take 1 tablet (40 mg total) by mouth daily at 6 PM. (Patient not taking: Reported on 07/18/2016)  . [DISCONTINUED] famotidine (PEPCID) 20 MG tablet Take 1 tablet (20 mg total) by mouth 2 (two) times daily.  . [DISCONTINUED] HYDROcodone-acetaminophen (NORCO) 5-325 MG tablet Take 1-2 tablets by mouth every 4 (four) hours as needed.  . [DISCONTINUED] ondansetron (ZOFRAN ODT) 4 MG disintegrating tablet Take 1 tablet (4 mg total) by mouth every 8 (eight) hours as needed for nausea or vomiting.  . [DISCONTINUED] pantoprazole (PROTONIX) 40 MG tablet Take 1 tablet (40 mg total) by mouth daily. (Patient not taking: Reported on 07/30/2016)  . [DISCONTINUED] pantoprazole (PROTONIX) 40 MG tablet Take 1 tablet (40 mg total) by mouth daily.  . [DISCONTINUED] sulfamethoxazole-trimethoprim (BACTRIM DS,SEPTRA DS) 800-160 MG per tablet Take 1 tablet by mouth 2 (two) times daily. (Patient not taking: Reported on 07/18/2016)   No facility-administered encounter medications on file as of 07/30/2016.    Allergies  Allergen Reactions  . Codeine Nausea And Vomiting and Other (See Comments)    "deathly  sick"  . Lipitor [Atorvastatin] Other (See Comments)    Joint pain   Patient Active Problem List   Diagnosis Date Noted  . Rectal bleeding 07/18/2016  . Chest pain, rule out acute myocardial infarction   . Chest pain 07/05/2016  . AKI (acute kidney injury) ([place]) 07/05/2016  . Leukocytosis 07/05/2016  . Enlarged prostate with urinary obstruction  07/04/2015  . Pure hypercholesterolemia   . Osteoarthritis   . Back pain   . Sciatica   . Osteoarthrosis   . Neuropathy ([place])   . Angina of effort ([place])   . Hyperlipidemia   . HTN (hypertension)   . Colon polyp   . CAD (coronary artery disease)   . Angina pectoris, crescendo ([place]) 12/31/2012    Class: Acute   Social History   Social History  . Marital status: Married    Spouse name: N/A  . Number of children: N/A  . Years of education: N/A   Occupational History  . Not on file.   Social History Main Topics  . Smoking status: Former Smoker    Years: 12.00    Types: Cigarettes    Quit date: 06/27/1964  . Smokeless tobacco: Never Used  . Alcohol use No  . Drug use: No  . Sexual activity: Not on file   Other Topics Concern  . Not on file   Social History Narrative  . No narrative on file    Mr. Bihl family history includes Dementia in his father.      Objective:    Vitals:   07/30/16 1424  BP: 138/76  Pulse: 60    Physical Exam  well-developed older white male in no acute distress, pleasant accompanied by his wife blood pressure 138/76 pulse 60 BMI 32. HEENT;nontraumatic normocephalic EOMI PERRLA sclera anicteric, Cardiovascular ;regular rate and rhythm with S1-S2 no murmur or gallop, Pulmonary; clear bilaterally, Abdomen; soft nontender nondistended he does have a diastases, no palpable mass or hepatosplenomegaly bowel sounds are present, Rectal ;exam not done, Extremities; no clubbing cyanosis or edema skin warm and dry, Neuropsych; mood and affect appropriate       Assessment & Plan:   #48 75 year old white male with acute episode of upper abdominal pain and nausea-most consistent with an acute gastritis. Symptoms resolved with short course of Protonix, and he is currently asymptomatic. #2 history of colon polyps-last colonoscopy approximately 10 years ago done at [facility] #3 nonobstructive coronary artery disease  Plan ; Would not pursue any further  workup at this time as his symptoms have completely resolved. Will give him another prescription for Protonix 40 mg every morning 30 days should he have any recurrence of upper abdominal pain so that he will have medication on hand. I have asked him to call should he have any recurrence of his symptoms though he can be reassessed.  Patient will sign a release today to obtain records from prior colonoscopy. He may be due for follow-up colonoscopy , and he can be scheduled accordingly with Dr. Fuller Plan.   Addendum- records reviewed from [facility] - Colonoscopy 2005- Dr Vladimir Faster - 2 polyps, largest 1cm sigmoid- both tubular adenomas. Will notify pt that he needs follow up colonoscopy.   Amy S Esterwood PA-C 07/30/2016   Cc: Lona Kettle, MD

## 2016-07-30 NOTE — Progress Notes (Signed)
Reviewed and agree with initial management plan.  Ryleah Miramontes T. [place] Pelto, MD FACG 

## 2016-08-02 ENCOUNTER — Other Ambulatory Visit: Payer: Medicare Other

## 2016-10-26 ENCOUNTER — Encounter: Payer: Self-pay | Admitting: Gastroenterology

## 2016-11-13 IMAGING — NM NM MYOCAR MULTI W/ SPECT
3 series · 18 of 18 positions shown · non-contrast
Comparison: none

[Series 1: rest · 6.51mm/px · 6 of 64 frames shown]
[frame 6/64]
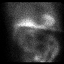
[frame 16/64]
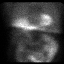
[frame 27/64]
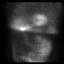
[frame 38/64]
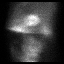
[frame 48/64]
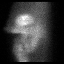
[frame 59/64]
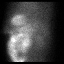

[Series 2: stress · 6.51mm/px · 6 of 512 frames shown (1 of 2)]
[frame 43/512]
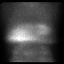
[frame 128/512]
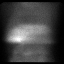
[frame 214/512]
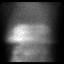
[frame 299/512]
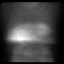
[frame 384/512]
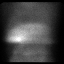
[frame 470/512]
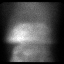

[Series 2: stress · 6.51mm/px · 6 of 64 frames shown (2 of 2)]
[frame 6/64]
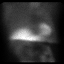
[frame 16/64]
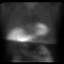
[frame 27/64]
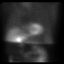
[frame 38/64]
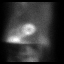
[frame 48/64]
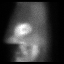
[frame 59/64]
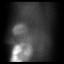

[18 of 18 positions shown; findings below may reference images not displayed]

Canned report from images found in remote index.

Refer to host system for actual result text.

## 2016-12-06 ENCOUNTER — Ambulatory Visit (AMBULATORY_SURGERY_CENTER): Payer: Self-pay

## 2016-12-06 ENCOUNTER — Encounter: Payer: Self-pay | Admitting: Gastroenterology

## 2016-12-06 VITALS — Ht 69.0 in | Wt 221.2 lb

## 2016-12-06 DIAGNOSIS — Z860100 Personal history of colon polyps, unspecified: Secondary | ICD-10-CM

## 2016-12-06 MED ORDER — NA SULFATE-K SULFATE-MG SULF 17.5-3.13-1.6 GM/177ML PO SOLN: 1.0000 | Freq: Once | ORAL | 0 refills | Status: AC

## 2016-12-06 NOTE — Progress Notes (Signed)
Denies allergies to eggs or soy products. Denies complication of anesthesia or sedation. Denies use of weight loss medication. Denies use of O2.   Emmi instructions given for colonoscopy.  

## 2016-12-20 ENCOUNTER — Ambulatory Visit (AMBULATORY_SURGERY_CENTER): Payer: Medicare Other | Admitting: Gastroenterology

## 2016-12-20 ENCOUNTER — Encounter: Payer: Self-pay | Admitting: Gastroenterology

## 2016-12-20 VITALS — BP 105/66 | HR 57 | Temp 96.8°F | Resp 11 | Ht 69.0 in | Wt 221.0 lb

## 2016-12-20 DIAGNOSIS — D122 Benign neoplasm of ascending colon: Secondary | ICD-10-CM

## 2016-12-20 DIAGNOSIS — Z860101 Personal history of adenomatous and serrated colon polyps: Secondary | ICD-10-CM

## 2016-12-20 DIAGNOSIS — D125 Benign neoplasm of sigmoid colon: Secondary | ICD-10-CM | POA: Diagnosis not present

## 2016-12-20 DIAGNOSIS — D120 Benign neoplasm of cecum: Secondary | ICD-10-CM

## 2016-12-20 DIAGNOSIS — Z86010 Personal history of colonic polyps: Secondary | ICD-10-CM

## 2016-12-20 DIAGNOSIS — D123 Benign neoplasm of transverse colon: Secondary | ICD-10-CM

## 2016-12-20 MED ORDER — SODIUM CHLORIDE 0.9 % IV SOLN: 500.0000 mL | INTRAVENOUS | Status: DC

## 2016-12-20 NOTE — Progress Notes (Signed)
Report to PACU, RN, vss, BBS= Clear.  

## 2016-12-20 NOTE — Patient Instructions (Signed)
YOU HAD AN ENDOSCOPIC PROCEDURE TODAY AT THE [facility]:   Refer to the procedure report that was given to you for any specific questions about what was found during the examination.  If the procedure report does not answer your questions, please call your gastroenterologist to clarify.  If you requested that your care partner not be given the details of your procedure findings, then the procedure report has been included in a sealed envelope for you to review at your convenience later.  YOU SHOULD EXPECT: Some feelings of bloating in the abdomen. Passage of more gas than usual.  Walking can help get rid of the air that was put into your GI tract during the procedure and reduce the bloating. If you had a lower endoscopy (such as a colonoscopy or flexible sigmoidoscopy) you may notice spotting of blood in your stool or on the toilet paper. If you underwent a bowel prep for your procedure, you may not have a normal bowel movement for a few days.  Please Note:  You might notice some irritation and congestion in your nose or some drainage.  This is from the oxygen used during your procedure.  There is no need for concern and it should clear up in a day or so.  SYMPTOMS TO REPORT IMMEDIATELY:   Following lower endoscopy (colonoscopy or flexible sigmoidoscopy):  Excessive amounts of blood in the stool  Significant tenderness or worsening of abdominal pains  Swelling of the abdomen that is new, acute  Fever of 100F or higher    For urgent or emergent issues, a gastroenterologist can be reached at any hour by calling [phone].   DIET:  We do recommend a small meal at first, but then you may proceed to your regular diet.  Drink plenty of fluids but you should avoid alcoholic beverages for 24 hours.  ACTIVITY:  You should plan to take it easy for the rest of today and you should NOT DRIVE or use heavy machinery until tomorrow (because of the sedation medicines used during the test).     FOLLOW UP: Our staff will call the number listed on your records the next business day following your procedure to check on you and address any questions or concerns that you may have regarding the information given to you following your procedure. If we do not reach you, we will leave a message.  However, if you are feeling well and you are not experiencing any problems, there is no need to return our call.  We will assume that you have returned to your regular daily activities without incident.  If any biopsies were taken you will be contacted by phone or by letter within the next 1-3 weeks.  Please call us at [phone] if you have not heard about the biopsies in 3 weeks.    SIGNATURES/CONFIDENTIALITY: You and/or your care partner have signed paperwork which will be entered into your electronic medical record.  These signatures attest to the fact that that the information above on your After Visit Summary has been reviewed and is understood.  Full responsibility of the confidentiality of this discharge information lies with you and/or your care-partner.   INFORMATION ON POLYPS  AND DIVERTICULOSIS GIVEN TO YOU TODAY 

## 2016-12-20 NOTE — Op Note (Addendum)
[place] Patient Name: William [name] Procedure Date: 12/20/2016 10:30 AM MRN: [id number] Endoscopist: Ladene Artist , MD Age: 76 Referring MD:  Date of Birth: [birth year] Gender: Male Account #: [id number] Procedure:                Colonoscopy Indications:              Surveillance: Personal history of adenomatous                            polyps on last colonoscopy > 5 years ago Medicines:                Monitored Anesthesia Care Procedure:                Pre-Anesthesia Assessment:                           - Prior to the procedure, a History and Physical                            was performed, and patient medications and                            allergies were reviewed. The patient's tolerance of                            previous anesthesia was also reviewed. The risks                            and benefits of the procedure and the sedation                            options and risks were discussed with the patient.                            All questions were answered, and informed consent                            was obtained. Prior Anticoagulants: The patient has                            taken no previous anticoagulant or antiplatelet                            agents. ASA Grade Assessment: II - A patient with                            mild systemic disease. After reviewing the risks                            and benefits, the patient was deemed in                            satisfactory condition to undergo the procedure.  After obtaining informed consent, the colonoscope                            was passed under direct vision. Throughout the                            procedure, the patient's blood pressure, pulse, and                            oxygen saturations were monitored continuously. The                            Model PCF-[id number]) scope was introduced                            through the anus and  advanced to the the cecum,                            identified by appendiceal orifice and ileocecal                            valve. The ileocecal valve, appendiceal orifice,                            and rectum were photographed. The quality of the                            bowel preparation was excellent. The colonoscopy                            was performed without difficulty. The patient                            tolerated the procedure well. Scope In: 10:45:18 AM Scope Out: 11:00:50 AM Scope Withdrawal Time: 0 hours 13 minutes 13 seconds  Total Procedure Duration: 0 hours 15 minutes 32 seconds  Findings:                 The perianal and digital rectal examinations were                            normal.                           Four sessile polyps were found in the sigmoid colon                            (1), transverse colon (2) and ascending colon (1).                            The polyps were 5 to 8 mm in size. These polyps                            were removed with a cold snare. Resection and  retrieval were complete.                           Two sessile polyps were found in the cecum. The                            polyps were 4 to 5 mm in size. These polyps were                            removed with a cold biopsy forceps. Resection and                            retrieval were complete.                           A few small-mouthed diverticula were found in the                            sigmoid colon. There was no evidence of                            diverticular bleeding.                           The exam was otherwise without abnormality on                            direct and retroflexion views. Complications:            No immediate complications. Estimated blood loss:                            None. Estimated Blood Loss:     Estimated blood loss: none. Impression:               - Four 5 to 8 mm polyps in the sigmoid colon,  in                            the transverse colon and in the ascending colon,                            removed with a cold snare. Resected and retrieved.                           - Two 4 to 5 mm polyps in the cecum, removed with a                            cold biopsy forceps. Resected and retrieved.                           - Mild diverticulosis in the sigmoid colon.                           - The examination was otherwise normal on direct  and retroflexion views. Recommendation:           - Repeat colonoscopy in 3 if 3 or more polyps are                            precancerous, otherwise no plans for future                            surveillance colonoscopies due to age.                           - Patient has a contact number available for                            emergencies. The signs and symptoms of potential                            delayed complications were discussed with the                            patient. Return to normal activities tomorrow.                            Written discharge instructions were provided to the                            patient.                           - Resume previous diet.                           - Continue present medications.                           - Await pathology results. Ladene Artist, MD 12/20/2016 11:06:38 AM This report has been signed electronically.

## 2016-12-20 NOTE — Progress Notes (Signed)
Called to room to assist during endoscopic procedure.  Patient ID and intended procedure confirmed with present staff. Received instructions for my participation in the procedure from the performing physician.  

## 2016-12-21 ENCOUNTER — Telehealth: Payer: Self-pay | Admitting: *Deleted

## 2016-12-21 NOTE — Telephone Encounter (Signed)
  Follow up Call-  Call back number 12/20/2016  Post procedure Call Back phone  # [phone]  Permission to leave phone message Yes  Some recent data might be hidden     Patient questions:  Do you have a fever, pain , or abdominal swelling? No. Pain Score  0 *  Have you tolerated food without any problems? Yes.    Have you been able to return to your normal activities? Yes.    Do you have any questions about your discharge instructions: Diet   No. Medications  No. Follow up visit  No.  Do you have questions or concerns about your Care? No.  Actions: * If pain score is 4 or above: No action needed, pain <4.

## 2017-01-03 ENCOUNTER — Encounter: Payer: Self-pay | Admitting: Gastroenterology

## 2018-01-17 ENCOUNTER — Other Ambulatory Visit (HOSPITAL_BASED_OUTPATIENT_CLINIC_OR_DEPARTMENT_OTHER): Payer: Self-pay | Admitting: Family Medicine

## 2018-01-17 ENCOUNTER — Ambulatory Visit (HOSPITAL_BASED_OUTPATIENT_CLINIC_OR_DEPARTMENT_OTHER)
Admission: RE | Admit: 2018-01-17 | Discharge: 2018-01-17 | Disposition: A | Discharge status: Home / Self Care | Payer: Medicare Other | Source: Ambulatory Visit | Attending: Family Medicine | Admitting: Family Medicine

## 2018-01-17 DIAGNOSIS — R52 Pain, unspecified: Secondary | ICD-10-CM

## 2018-01-17 DIAGNOSIS — R609 Edema, unspecified: Secondary | ICD-10-CM

## 2018-01-17 DIAGNOSIS — R936 Abnormal findings on diagnostic imaging of limbs: Secondary | ICD-10-CM | POA: Diagnosis not present

## 2018-01-17 DIAGNOSIS — M7989 Other specified soft tissue disorders: Secondary | ICD-10-CM | POA: Diagnosis present

## 2020-02-15 ENCOUNTER — Encounter: Payer: Self-pay | Admitting: Gastroenterology

## 2020-02-15 ENCOUNTER — Ambulatory Visit: Payer: Medicare Other | Admitting: Gastroenterology

## 2020-02-15 VITALS — BP 152/74 | HR 84 | Temp 97.7°F | Ht 69.0 in | Wt 219.8 lb

## 2020-02-15 DIAGNOSIS — Z86010 Personal history of colonic polyps: Secondary | ICD-10-CM | POA: Diagnosis not present

## 2020-02-15 MED ORDER — NA SULFATE-K SULFATE-MG SULF 17.5-3.13-1.6 GM/177ML PO SOLN: 1.0000 | Freq: Once | ORAL | 0 refills | Status: AC

## 2020-02-15 NOTE — Patient Instructions (Signed)
You have been scheduled for a colonoscopy. Please follow written instructions given to you at your visit today.  Please pick up your prep supplies at the pharmacy within the next 1-3 days. If you use inhalers (even only as needed), please bring them with you on the day of your procedure.   

## 2020-02-15 NOTE — Progress Notes (Signed)
History of Present Illness: This is a 79 year old male referred by Lawerance Cruel, MD for the evaluation of personal history of adenomatous colon polyps.  4 tubular adenomas noted on last colonoscopy.  He has no ongoing gastrointestinal complaints.  He discussed the option of another colonoscopy or discontinuing colonoscopies with his wife prior to the office visit and they had reached a decision that if colonoscopy is offered he would want to proceed. Denies weight loss, abdominal pain, constipation, diarrhea, change in stool caliber, melena, hematochezia, nausea, vomiting, dysphagia, reflux symptoms, chest pain.   Colonoscopy March 2018 - Four 5 to 8 mm polyps in the sigmoid colon, in the transverse colon and in the ascending colon, removed with a cold snare. Resected and retrieved. - Two 4 to 5 mm polyps in the cecum, removed with a cold biopsy forceps. Resected and retrieved. - Mild diverticulosis in the sigmoid colon. - The examination was otherwise normal on direct and retroflexion views. Path: 4 tubular adenomas    Allergies  Allergen Reactions  . Codeine Nausea And Vomiting and Other (See Comments)    "deathly sick"  . Lipitor [Atorvastatin] Other (See Comments)    Joint pain   Outpatient Medications Prior to Visit  Medication Sig Dispense Refill  . aspirin EC 81 MG tablet Take 81 mg by mouth daily.    Marland Kitchen ezetimibe (ZETIA) 10 MG tablet Take 10 mg by mouth daily.    . metoprolol (LOPRESSOR) 50 MG tablet Take 1 tablet (50 mg total) by mouth 2 (two) times daily. 60 tablet 11   Facility-Administered Medications Prior to Visit  Medication Dose Route Frequency Provider Last Rate Last Admin  . 0.9 %  sodium chloride infusion  500 mL Intravenous Continuous Ladene Artist, MD       Past Medical History:  Diagnosis Date  . BPH (benign prostatic hyperplasia)   . CAD (coronary artery disease) cardiologist-  dr Daneen Schick   with intermediate obstruction in the distal RCA and  PDA by catheter 2013, Dr Tamala Julian  . Colon polyps   . Full dentures   . History of colon polyps   . History of kidney stones    1970's  . HTN (hypertension)   . Hyperlipidemia   . Lower urinary tract symptoms (LUTS)   . Osteoarthritis    thumbs  . Sciatica   . Wears glasses    Past Surgical History:  Procedure Laterality Date  . ANTERIOR CERVICAL DECOMP/DISCECTOMY FUSION  05-24-2009   C3 -- C7  . APPENDECTOMY  age 89  . CARDIAC CATHETERIZATION N/A 07/06/2016   Procedure: Left Heart Cath and Coronary Angiography;  Surgeon: Jettie Booze, MD;  Location: [facility];  Service: Cardiovascular;  Laterality: N/A;  . CARDIOVASCULAR STRESS TEST  06-23-2015  dr Daneen Schick   normal lexiscan study/  no perfusion defects or ischemia/  normal LV function and wall motion , ef 57%  . CHOLECYSTECTOMY    . LEFT HEART CATHETERIZATION WITH CORONARY ANGIOGRAM N/A 01/01/2013   Procedure: LEFT HEART CATHETERIZATION WITH CORONARY ANGIOGRAM;  Surgeon: Sinclair Grooms, MD;  Location: [facility];  Service: Cardiovascular;  Laterality: N/A;   Intermediate stenosis in  proximal PDA 50-70%;  dRCA 50%;  ostial and mid CFX 50%;  widely patent LAD;  normal LVF, ef 60%  . LUMBAR SPINE SURGERY  1990's  . SHOULDER SURGERY Left   . TRANSURETHRAL RESECTION OF PROSTATE N/A 07/04/2015   Procedure: TRANSURETHRAL RESECTION  OF THE PROSTATE WITH GYRUS INSTRUMENTS;  Surgeon: Franchot Gallo, MD;  Location: [facility];  Service: Urology;  Laterality: N/A;   Social History   Socioeconomic History  . Marital status: Married    Spouse name: Not on file  . Number of children: 3  . Years of education: Not on file  . Highest education level: Not on file  Occupational History  . Occupation: retired  Tobacco Use  . Smoking status: Former Smoker    Years: 12.00    Types: Cigarettes    Quit date: 06/27/1964    Years since quitting: 55.6  . Smokeless tobacco: Never Used  Substance and Sexual  Activity  . Alcohol use: No  . Drug use: No  . Sexual activity: Not on file  Other Topics Concern  . Not on file  Social History Narrative  . Not on file   Social Determinants of Health   Financial Resource Strain:   . Difficulty of Paying Living Expenses:   Food Insecurity:   . Worried About Charity fundraiser in the Last Year:   . Arboriculturist in the Last Year:   Transportation Needs:   . Film/video editor (Medical):   Marland Kitchen Lack of Transportation (Non-Medical):   Physical Activity:   . Days of Exercise per Week:   . Minutes of Exercise per Session:   Stress:   . Feeling of Stress :   Social Connections:   . Frequency of Communication with Friends and Family:   . Frequency of Social Gatherings with Friends and Family:   . Attends Religious Services:   . Active Member of Clubs or Organizations:   . Attends Archivist Meetings:   Marland Kitchen Marital Status:    Family History  Problem Relation Age of Onset  . Dementia Father   . Heart attack Other   . Colon cancer Neg Hx       Review of Systems: Pertinent positive and negative review of systems were noted in the above HPI section. All other review of systems were otherwise negative.   Physical Exam: General: Well developed, well nourished, no acute distress Head: Normocephalic and atraumatic Eyes:  sclerae anicteric, EOMI Ears: Normal auditory acuity Mouth: Not examined, mask on during Covid-19 pandemic Neck: Supple, no masses or thyromegaly Lungs: Clear throughout to auscultation Heart: Regular rate and rhythm; no murmurs, rubs or bruits Abdomen: Soft, non tender and non distended. No masses, hepatosplenomegaly or hernias noted. Normal Bowel sounds Rectal: Deferred to colonoscopy Musculoskeletal: Symmetrical with no gross deformities  Skin: No lesions on visible extremities Pulses:  Normal pulses noted Extremities: No clubbing, cyanosis, edema or deformities noted Neurological: Alert oriented x 4,  grossly nonfocal Cervical Nodes:  No significant cervical adenopathy Inguinal Nodes: No significant inguinal adenopathy Psychological:  Alert and cooperative. Normal mood and affect   Assessment and Recommendations:  1.  Personal history of multiple adenomatous colon polyps due for 3-year interval surveillance colonoscopy.  We discussed the potential of discontinuing colonoscopies due to age however he is in excellent health and he would like to proceed with another colonoscopy.  Schedule colonoscopy. The risks (including bleeding, perforation, infection, missed lesions, medication reactions and possible hospitalization or surgery if complications occur), benefits, and alternatives to colonoscopy with possible biopsy and possible polypectomy were discussed with the patient and they consent to proceed.     cc: Lawerance Cruel, [place],  [place] [id number]

## 2020-02-29 ENCOUNTER — Encounter: Payer: Self-pay | Admitting: Gastroenterology

## 2020-03-01 ENCOUNTER — Telehealth: Payer: Self-pay

## 2020-03-01 NOTE — Telephone Encounter (Signed)
Called pt to confirm if pt had both doses of Covid vaccine since no documentation in MR.  No answer.  Left message on home phone (ok per DPR) for pt to call back to confirm whether or not pt had COVID vaccine or to schedule a COVID test which is required before scheduled colonoscopy.

## 2020-03-01 NOTE — Telephone Encounter (Signed)
Pt's wife called back and stated pt had both covid vaccines 1st one in February and 2nd in March

## 2020-03-03 ENCOUNTER — Encounter: Payer: Self-pay | Admitting: Gastroenterology

## 2020-03-03 ENCOUNTER — Ambulatory Visit (AMBULATORY_SURGERY_CENTER): Payer: Medicare Other | Admitting: Gastroenterology

## 2020-03-03 ENCOUNTER — Other Ambulatory Visit: Payer: Self-pay

## 2020-03-03 VITALS — BP 109/60 | HR 54 | Temp 97.1°F | Resp 9 | Ht 69.0 in | Wt 219.0 lb

## 2020-03-03 DIAGNOSIS — D123 Benign neoplasm of transverse colon: Secondary | ICD-10-CM

## 2020-03-03 DIAGNOSIS — D121 Benign neoplasm of appendix: Secondary | ICD-10-CM

## 2020-03-03 DIAGNOSIS — D120 Benign neoplasm of cecum: Secondary | ICD-10-CM

## 2020-03-03 DIAGNOSIS — Z86010 Personal history of colonic polyps: Secondary | ICD-10-CM

## 2020-03-03 MED ORDER — SODIUM CHLORIDE 0.9 % IV SOLN: 500.0000 mL | Freq: Once | INTRAVENOUS | Status: DC

## 2020-03-03 NOTE — Progress Notes (Signed)
Report to PACU, RN, vss, BBS= Clear.  

## 2020-03-03 NOTE — Progress Notes (Signed)
Called to room to assist during endoscopic procedure.  Patient ID and intended procedure confirmed with present staff. Received instructions for my participation in the procedure from the performing physician.  

## 2020-03-03 NOTE — Patient Instructions (Signed)
HANDOUTS PROVIDED ON: POLYPS, DIVERTICULOSIS, HEMORRHOIDS, & HIGH FIBER DIET  The polyps removed today have been sent for pathology.  The results can take 1-3 weeks to receive.     You may resume your previous diet and medication schedule.  Thank you for allowing [place] to care for you today!!!   YOU HAD AN ENDOSCOPIC PROCEDURE TODAY AT [facility]:   Refer to the procedure report that was given to you for any specific questions about what was found during the examination.  If the procedure report does not answer your questions, please call your gastroenterologist to clarify.  If you requested that your care partner not be given the details of your procedure findings, then the procedure report has been included in a sealed envelope for you to review at your convenience later.  YOU SHOULD EXPECT: Some feelings of bloating in the abdomen. Passage of more gas than usual.  Walking can help get rid of the air that was put into your GI tract during the procedure and reduce the bloating. If you had a lower endoscopy (such as a colonoscopy or flexible sigmoidoscopy) you may notice spotting of blood in your stool or on the toilet paper. If you underwent a bowel prep for your procedure, you may not have a normal bowel movement for a few days.  Please Note:  You might notice some irritation and congestion in your nose or some drainage.  This is from the oxygen used during your procedure.  There is no need for concern and it should clear up in a day or so.  SYMPTOMS TO REPORT IMMEDIATELY:   Following lower endoscopy (colonoscopy or flexible sigmoidoscopy):  Excessive amounts of blood in the stool  Significant tenderness or worsening of abdominal pains  Swelling of the abdomen that is new, acute  Fever of 100F or higher  For urgent or emergent issues, a gastroenterologist can be reached at any hour by calling [phone]. Do not use MyChart messaging for urgent concerns.    DIET:  We  do recommend a small meal at first, but then you may proceed to your regular diet.  Drink plenty of fluids but you should avoid alcoholic beverages for 24 hours.  ACTIVITY:  You should plan to take it easy for the rest of today and you should NOT DRIVE or use heavy machinery until tomorrow (because of the sedation medicines used during the test).    FOLLOW UP: Our staff will call the number listed on your records 48-72 hours following your procedure to check on you and address any questions or concerns that you may have regarding the information given to you following your procedure. If we do not reach you, we will leave a message.  We will attempt to reach you two times.  During this call, we will ask if you have developed any symptoms of COVID 19. If you develop any symptoms (ie: fever, flu-like symptoms, shortness of breath, cough etc.) before then, please call [phone].  If you test positive for Covid 19 in the 2 weeks post procedure, please call and report this information to [place].    If any biopsies were taken you will be contacted by phone or by letter within the next 1-3 weeks.  Please call us at [phone] if you have not heard about the biopsies in 3 weeks.    SIGNATURES/CONFIDENTIALITY: You and/or your care partner have signed paperwork which will be entered into your electronic medical record.  These signatures  attest to the fact that that the information above on your After Visit Summary has been reviewed and is understood.  Full responsibility of the confidentiality of this discharge information lies with you and/or your care-partner.

## 2020-03-07 ENCOUNTER — Telehealth: Payer: Self-pay

## 2020-03-07 NOTE — Telephone Encounter (Signed)
Called [phone] and left a messaged we tried to reach pt for a follow up call. maw

## 2020-03-07 NOTE — Telephone Encounter (Signed)
Left message on follow up call. 

## 2020-03-17 ENCOUNTER — Encounter: Payer: Self-pay | Admitting: Gastroenterology

## 2021-02-02 ENCOUNTER — Other Ambulatory Visit: Payer: Self-pay | Admitting: Orthopedic Surgery

## 2021-02-02 DIAGNOSIS — M25511 Pain in right shoulder: Secondary | ICD-10-CM

## 2021-02-12 ENCOUNTER — Other Ambulatory Visit: Payer: Self-pay

## 2021-02-12 ENCOUNTER — Ambulatory Visit
Admission: RE | Admit: 2021-02-12 | Discharge: 2021-02-12 | Disposition: A | Discharge status: Home / Self Care | Payer: Medicare Other | Source: Ambulatory Visit | Attending: Orthopedic Surgery | Admitting: Orthopedic Surgery

## 2021-02-12 DIAGNOSIS — M25511 Pain in right shoulder: Secondary | ICD-10-CM

## 2021-09-20 ENCOUNTER — Inpatient Hospital Stay (HOSPITAL_COMMUNITY)
Admission: EM | Admit: 2021-09-20 | Discharge: 2021-10-01 | DRG: 233 | Disposition: A | Discharge status: Home / Self Care | Payer: Medicare Other | Attending: Thoracic Surgery (Cardiothoracic Vascular Surgery) | Admitting: Thoracic Surgery (Cardiothoracic Vascular Surgery)

## 2021-09-20 ENCOUNTER — Emergency Department (HOSPITAL_COMMUNITY): Payer: Medicare Other

## 2021-09-20 ENCOUNTER — Other Ambulatory Visit: Payer: Self-pay

## 2021-09-20 ENCOUNTER — Encounter (HOSPITAL_COMMUNITY): Payer: Self-pay | Admitting: *Deleted

## 2021-09-20 DIAGNOSIS — I200 Unstable angina: Secondary | ICD-10-CM

## 2021-09-20 DIAGNOSIS — J9811 Atelectasis: Secondary | ICD-10-CM | POA: Diagnosis not present

## 2021-09-20 DIAGNOSIS — R079 Chest pain, unspecified: Secondary | ICD-10-CM | POA: Diagnosis not present

## 2021-09-20 DIAGNOSIS — J90 Pleural effusion, not elsewhere classified: Secondary | ICD-10-CM | POA: Diagnosis not present

## 2021-09-20 DIAGNOSIS — E119 Type 2 diabetes mellitus without complications: Secondary | ICD-10-CM

## 2021-09-20 DIAGNOSIS — Z981 Arthrodesis status: Secondary | ICD-10-CM

## 2021-09-20 DIAGNOSIS — Z885 Allergy status to narcotic agent status: Secondary | ICD-10-CM

## 2021-09-20 DIAGNOSIS — I10 Essential (primary) hypertension: Secondary | ICD-10-CM | POA: Diagnosis present

## 2021-09-20 DIAGNOSIS — J9601 Acute respiratory failure with hypoxia: Secondary | ICD-10-CM | POA: Diagnosis not present

## 2021-09-20 DIAGNOSIS — D696 Thrombocytopenia, unspecified: Secondary | ICD-10-CM | POA: Diagnosis not present

## 2021-09-20 DIAGNOSIS — Z7982 Long term (current) use of aspirin: Secondary | ICD-10-CM

## 2021-09-20 DIAGNOSIS — Z7984 Long term (current) use of oral hypoglycemic drugs: Secondary | ICD-10-CM

## 2021-09-20 DIAGNOSIS — D62 Acute posthemorrhagic anemia: Secondary | ICD-10-CM | POA: Diagnosis not present

## 2021-09-20 DIAGNOSIS — E785 Hyperlipidemia, unspecified: Secondary | ICD-10-CM

## 2021-09-20 DIAGNOSIS — R579 Shock, unspecified: Secondary | ICD-10-CM | POA: Diagnosis not present

## 2021-09-20 DIAGNOSIS — Z888 Allergy status to other drugs, medicaments and biological substances status: Secondary | ICD-10-CM

## 2021-09-20 DIAGNOSIS — I214 Non-ST elevation (NSTEMI) myocardial infarction: Principal | ICD-10-CM | POA: Diagnosis present

## 2021-09-20 DIAGNOSIS — Z820 Family history of epilepsy and other diseases of the nervous system: Secondary | ICD-10-CM

## 2021-09-20 DIAGNOSIS — I25110 Atherosclerotic heart disease of native coronary artery with unstable angina pectoris: Secondary | ICD-10-CM | POA: Diagnosis present

## 2021-09-20 DIAGNOSIS — Z951 Presence of aortocoronary bypass graft: Secondary | ICD-10-CM

## 2021-09-20 DIAGNOSIS — R110 Nausea: Secondary | ICD-10-CM | POA: Diagnosis not present

## 2021-09-20 DIAGNOSIS — M1990 Unspecified osteoarthritis, unspecified site: Secondary | ICD-10-CM | POA: Diagnosis present

## 2021-09-20 DIAGNOSIS — N400 Enlarged prostate without lower urinary tract symptoms: Secondary | ICD-10-CM | POA: Diagnosis present

## 2021-09-20 DIAGNOSIS — E861 Hypovolemia: Secondary | ICD-10-CM | POA: Diagnosis not present

## 2021-09-20 DIAGNOSIS — Z87891 Personal history of nicotine dependence: Secondary | ICD-10-CM

## 2021-09-20 DIAGNOSIS — Z20822 Contact with and (suspected) exposure to covid-19: Secondary | ICD-10-CM | POA: Diagnosis present

## 2021-09-20 DIAGNOSIS — Z79899 Other long term (current) drug therapy: Secondary | ICD-10-CM

## 2021-09-20 DIAGNOSIS — I249 Acute ischemic heart disease, unspecified: Secondary | ICD-10-CM

## 2021-09-20 LAB — RESP PANEL BY RT-PCR (FLU A&B, COVID) ARPGX2
Influenza A by PCR: NEGATIVE
Influenza B by PCR: NEGATIVE
SARS Coronavirus 2 by RT PCR: NEGATIVE

## 2021-09-20 LAB — BASIC METABOLIC PANEL
Anion gap: 5 (ref 5–15)
BUN: 17 mg/dL (ref 8–23)
CO2: 26 mmol/L (ref 22–32)
Calcium: 8.9 mg/dL (ref 8.9–10.3)
Chloride: 106 mmol/L (ref 98–111)
Creatinine, Ser: 1.09 mg/dL (ref 0.61–1.24)
GFR, Estimated: >60 mL/min (ref >60)
Glucose, Bld: 184 mg/dL — ABNORMAL HIGH (ref 70–99)
Potassium: 4.1 mmol/L (ref 3.5–5.1)
Sodium: 137 mmol/L (ref 135–145)

## 2021-09-20 LAB — CBC
HCT: 47.4 % (ref 39.0–52.0)
Hemoglobin: 15.6 g/dL (ref 13.0–17.0)
MCH: 30.5 pg (ref 26.0–34.0)
MCHC: 32.9 g/dL (ref 30.0–36.0)
MCV: 92.6 fL (ref 80.0–100.0)
Platelets: 174 10*3/uL (ref 150–400)
RBC: 5.12 MIL/uL (ref 4.22–5.81)
RDW: 12.6 % (ref 11.5–15.5)
WBC: 7.8 10*3/uL (ref 4.0–10.5)
nRBC: 0 % (ref 0.0–0.2)

## 2021-09-20 LAB — HEPARIN LEVEL (UNFRACTIONATED): Heparin Unfractionated: 0.1 IU/mL — ABNORMAL LOW (ref 0.30–0.70)

## 2021-09-20 LAB — TROPONIN I (HIGH SENSITIVITY)
Troponin I (High Sensitivity): 18 ng/L — ABNORMAL HIGH (ref <18)
Troponin I (High Sensitivity): 125 ng/L (ref <18)

## 2021-09-20 MED ORDER — LORAZEPAM 2 MG/ML IJ SOLN: 0.5000 mg | Freq: Once | INTRAMUSCULAR | Status: DC

## 2021-09-20 MED ORDER — HEPARIN BOLUS VIA INFUSION
4000.0000 [IU] | Freq: Once | INTRAVENOUS | Status: AC
  Administered 2021-09-20: 4000 [IU] via INTRAVENOUS

## 2021-09-20 MED ORDER — HEPARIN (PORCINE) 25000 UT/250ML-% IV SOLN
1450.0000 [IU]/h | INTRAVENOUS | Status: DC
  Administered 2021-09-20: 1100 [IU]/h via INTRAVENOUS
  Filled 2021-09-20 (×2): qty 250

## 2021-09-20 NOTE — ED Provider Notes (Signed)
[name]   CSN: [id number] Arrival date & time: 09/20/21  1141     History Chief Complaint  Patient presents with   Chest Pain    [name] is a 80 y.o. male.  HPI    80 year old male arrived by EMS for chest pain. Patient states chest pain started this morning as a dull ache then progressed to intense pain and pressure. He endorses numbness and tingling down his left and right arms radiating to his fingers. He received Nitro sublingual x 2 on EMS with moderate symptom relief. Rates his pain at a 3 currently. Patient denies fever, cough, vision changes, syncope, dizziness, headache, abdominal pain.  Past Medical History:  Diagnosis Date   BPH (benign prostatic hyperplasia)    CAD (coronary artery disease)    Nonobstructive at cardiac catheterization 2017   Colon polyps    Full dentures    History of colon polyps    History of kidney stones    1970's   HTN (hypertension)    Hyperlipidemia    Lower urinary tract symptoms (LUTS)    Osteoarthritis    Sciatica    Wears glasses     Patient Active Problem List   Diagnosis Date Noted   NSTEMI (non-ST elevated myocardial infarction) ([place]) 09/20/2021   Rectal bleeding 07/18/2016   Chest pain, rule out acute myocardial infarction    Chest pain 07/05/2016   AKI (acute kidney injury) ([place]) 07/05/2016   Leukocytosis 07/05/2016   Enlarged prostate with urinary obstruction 07/04/2015   Pure hypercholesterolemia    Osteoarthritis    Back pain    Sciatica    Osteoarthrosis    Neuropathy    Angina of effort (HCC)    Hyperlipidemia    HTN (hypertension)    Colon polyp    CAD (coronary artery disease)    Angina pectoris, crescendo ([place]) 12/31/2012    Class: Acute    Past Surgical History:  Procedure Laterality Date   ANTERIOR CERVICAL DECOMP/DISCECTOMY FUSION  05-24-2009   C3 -- C7   APPENDECTOMY  age 38   CARDIAC CATHETERIZATION N/A 07/06/2016   Procedure: Left Heart Cath  and Coronary Angiography;  Surgeon: Jettie Booze, MD;  Location: [place];  Service: Cardiovascular;  Laterality: N/A;   CARDIOVASCULAR STRESS TEST  06-23-2015  dr Daneen Schick   normal lexiscan study/  no perfusion defects or ischemia/  normal LV function and wall motion , ef 57%   CHOLECYSTECTOMY     LEFT HEART CATHETERIZATION WITH CORONARY ANGIOGRAM N/A 01/01/2013   Procedure: LEFT HEART CATHETERIZATION WITH CORONARY ANGIOGRAM;  Surgeon: Sinclair Grooms, MD;  Location: [facility] CATH [facility];  Service: Cardiovascular;  Laterality: N/A;   Intermediate stenosis in  proximal PDA 50-70%;  dRCA 50%;  ostial and mid CFX 50%;  widely patent LAD;  normal LVF, ef 60%   LUMBAR SPINE SURGERY  1990's   SHOULDER SURGERY Left    TRANSURETHRAL RESECTION OF PROSTATE N/A 07/04/2015   Procedure: TRANSURETHRAL RESECTION OF THE PROSTATE WITH GYRUS INSTRUMENTS;  Surgeon: Franchot Gallo, MD;  Location: [facility];  Service: Urology;  Laterality: N/A;       Family History  Problem Relation Age of Onset   Dementia Father    Heart attack Other    Colon cancer Neg Hx    Rectal cancer Neg Hx    Stomach cancer Neg Hx     Social History   Tobacco  Use   Smoking status: Former    Years: 12.00    Types: Cigarettes    Quit date: 06/27/1964    Years since quitting: 57.2   Smokeless tobacco: Never  Vaping Use   Vaping Use: Never used  Substance Use Topics   Alcohol use: No   Drug use: No    Home Medications Prior to Admission medications   Medication Sig Start Date End Date Taking? Authorizing Provider  ezetimibe (ZETIA) 10 MG tablet Take 10 mg by mouth daily.   Yes [provider]  glipiZIDE (GLUCOTROL) 5 MG tablet Take 5 mg by mouth every morning. 08/17/21  Yes [provider]  metoprolol (LOPRESSOR) 50 MG tablet Take 1 tablet (50 mg total) by mouth 2 (two) times daily. 01/01/13  Yes Belva Crome, MD  pravastatin (PRAVACHOL) 20 MG tablet Take 20 mg by mouth  once a week.   Yes [provider]  aspirin EC 81 MG tablet Take 81 mg by mouth daily.    [provider]    Allergies    Codeine and Lipitor [atorvastatin]  Review of Systems   Review of Systems  Constitutional:        Per HPI, otherwise negative  HENT:         Per HPI, otherwise negative  Respiratory:         Per HPI, otherwise negative  Cardiovascular:        Per HPI, otherwise negative  Gastrointestinal:  Negative for vomiting.  Endocrine:       Negative aside from HPI  Genitourinary:        Neg aside from HPI   Musculoskeletal:        Per HPI, otherwise negative  Skin: Negative.   Neurological:  Negative for syncope.   Physical Exam Updated Vital Signs BP (!) 167/98   Pulse 60   Temp 98.1 F (36.7 C) (Oral)   Resp 17   Ht 5\' 9"  (1.753 m)   Wt 97.1 kg   SpO2 98%   BMI 31.60 kg/m   Physical Exam Vitals and nursing note reviewed.  Constitutional:      General: He is not in acute distress.    Appearance: He is well-developed.  HENT:     Head: Normocephalic and atraumatic.  Eyes:     Conjunctiva/sclera: Conjunctivae normal.  Cardiovascular:     Rate and Rhythm: Normal rate and regular rhythm.  Pulmonary:     Effort: Pulmonary effort is normal. No respiratory distress.     Breath sounds: No stridor.  Abdominal:     General: There is no distension.  Skin:    General: Skin is warm and dry.  Neurological:     Mental Status: He is alert and oriented to person, place, and time.    ED Results / Procedures / Treatments   Labs (all labs ordered are listed, but only abnormal results are displayed) Labs Reviewed  BASIC METABOLIC PANEL - Abnormal; Notable for the following components:      Result Value   Glucose, Bld 184 (*)    All other components within normal limits  TROPONIN I (HIGH SENSITIVITY) - Abnormal; Notable for the following components:   Troponin I (High Sensitivity) 18 (*)    All other components within normal limits   TROPONIN I (HIGH SENSITIVITY) - Abnormal; Notable for the following components:   Troponin I (High Sensitivity) 125 (*)    All other components within normal limits  CBC  HEPARIN  LEVEL (UNFRACTIONATED)    EKG EKG Interpretation  Date/Time:  Wednesday September 20 2021 11:51:15 EST Ventricular Rate:  62 PR Interval:  191 QRS Duration: 95 QT Interval:  418 QTC Calculation: 425 R Axis:   -63 Text Interpretation: Sinus rhythm Abnormal R-wave progression, early transition Inferior infarct, old Baseline wander in lead(s) V3 Abnormal ECG Confirmed by Carmin Muskrat [phone]) on 09/20/2021 11:58:23 AM  Radiology DG Chest Portable 1 View  Result Date: 09/20/2021 CLINICAL DATA:  Chest pain EXAM: PORTABLE CHEST 1 VIEW COMPARISON:  Chest x-ray dated July 05, 2016 FINDINGS: Cardiac and mediastinal contours are unchanged and within normal limits for AP technique. Clear lungs. No pleural effusion or pneumothorax. IMPRESSION: No active disease. Electronically Signed   By: Yetta Glassman M.D.   On: 09/20/2021 12:12    Procedures Procedures   Medications Ordered in ED Medications  heparin bolus via infusion 4,000 Units (4,000 Units Intravenous Bolus from Bag 09/20/21 1529)    Followed by  heparin ADULT infusion 100 units/mL (25000 units/226mL) (1,100 Units/hr Intravenous New Bag/Given 09/20/21 1529)    ED Course  I have reviewed the triage vital signs and the nursing notes.  Pertinent labs & imaging results that were available during my care of the patient were reviewed by me and considered in my medical decision making (see chart for details).  Sinus 62 on cardiac monitor unremarkable Pulse ox 100% room air normal On repeat exam patient is in no distress, continues to have no pain.  However, the patient's second troponin is substantially elevated, delta 107.  With consideration of his history of obstructive disease, patient would start heparin, have discussed this case with our  cardiology colleagues, admitted to the internal medicine team for concern for unstable angina. MDM Rules/Calculators/A&P MDM Number of Diagnoses or Management Options ACS (acute coronary syndrome) ([place]): new, needed workup   Amount and/or Complexity of Data Reviewed Clinical lab tests: ordered and reviewed Tests in the radiology section of CPT: ordered and reviewed Tests in the medicine section of CPT: reviewed and ordered Decide to obtain previous medical records or to obtain history from someone other than the patient: yes Obtain history from someone other than the patient: yes Review and summarize past medical records: yes Discuss the patient with other providers: yes Independent visualization of images, tracings, or specimens: yes  Risk of Complications, Morbidity, and/or Mortality Presenting problems: high Diagnostic procedures: high Management options: high  Critical Care Total time providing critical care: 30-74 minutes (45)  Patient Progress Patient progress: improved   Final Clinical Impression(s) / ED Diagnoses Final diagnoses:  ACS (acute coronary syndrome) ([place])     Carmin Muskrat, MD 09/20/21 1655

## 2021-09-20 NOTE — ED Notes (Addendum)
Date and time results received: 09/20/21 3:02 PM   Test: troponin Critical Value: 125  Name of Provider Notified: Dr. Vanita Panda  Orders Received? Or Actions Taken?: see orders

## 2021-09-20 NOTE — Progress Notes (Addendum)
ANTICOAGULATION CONSULT NOTE - Initial Consult  Pharmacy Consult for Heparin Indication: chest pain/ACS  Allergies  Allergen Reactions   Codeine Nausea And Vomiting and Other (See Comments)    "deathly sick"   Lipitor [Atorvastatin] Other (See Comments)    Joint pain    Patient Measurements: Height: 5\' 9"  (175.3 cm) Weight: 97.1 kg (214 lb) IBW/kg (Calculated) : 70.7 HEPARIN DW (KG): 91   Vital Signs: Temp: 98.1 F (36.7 C) (11/30 1149) Temp Source: Oral (11/30 1149) BP: 126/80 (11/30 1500) Pulse Rate: 59 (11/30 1500)  Labs: Recent Labs    09/20/21 1145 09/20/21 1415  HGB 15.6  --   HCT 47.4  --   PLT 174  --   CREATININE 1.09  --   TROPONINIHS 18* 125*    Estimated Creatinine Clearance: 61.1 mL/min (by C-G formula based on SCr of 1.09 mg/dL).   Medical History: Past Medical History:  Diagnosis Date   BPH (benign prostatic hyperplasia)    CAD (coronary artery disease) cardiologist-  dr Daneen Schick   with intermediate obstruction in the distal RCA and PDA by catheter 2013, Dr Tamala Julian   Colon polyps    Full dentures    History of colon polyps    History of kidney stones    1970's   HTN (hypertension)    Hyperlipidemia    Lower urinary tract symptoms (LUTS)    Osteoarthritis    thumbs   Sciatica    Wears glasses     Medications:  See med rec  Assessment: Patient presented to ED with Chest Pain. Troponin is elevated. Patient is not on oral anticoagulants. Pharmacy asked to start heparin  Goal of Therapy:  Heparin level 0.3-0.7 units/ml Monitor platelets by anticoagulation protocol: Yes   Plan:  Give 4000 units bolus x 1 Start heparin infusion at 1100 units/hr Check anti-Xa level in ~8 hours and daily while on heparin Continue to monitor H&H and platelets  Isac Sarna, BS Vena Austria, BCPS Clinical Pharmacist Pager [phone] 09/20/2021,3:14 PM

## 2021-09-20 NOTE — Consult Note (Signed)
Cardiology Consultation:   Patient ID: William [name] [name]; [id number]; Mar 07, 1941   Admit date: 09/20/2021 Date of Consult: 09/20/2021  Primary Care Provider: Lawerance Cruel, MD Primary Cardiologist: Sinclair Grooms, MD Primary Electrophysiologist: None   Patient Profile:   William [name] is an 80 y.o. male with a history of nonobstructive CAD by cardiac catheterization in 2017, hypertension, and hyperlipidemia who is being seen today for the evaluation of chest pain and abnormal troponin at the request of Dr. Vanita Panda.  History of Present Illness:   William [name] is a patient of Dr. Tamala Julian with history of nonobstructive CAD last assessed by cardiac catheterization in 2017.  He presents to the [facility] via EMS with recent onset chest discomfort.  He states that he woke up this morning and "just did not feel right."  As he started to move around he experienced chest tightness, even with brushing his teeth and getting dressed this morning.  States that pain radiated into both arms and subscapular region.  Symptoms waxed and waned and they ultimately called EMS.  He reports improvement in symptoms with nitroglycerin.  At present he is on IV heparin and hemodynamically stable.  Up to this point he does not describe any exertional limitations or chest tightness.  His wife states that he had an episode of headache on Saturday and some confusion which resolved.  He has no focal motor weakness at this time, no dysarthria and no headache.  ECG today shows sinus rhythm, possible old inferior infarct pattern.  High-sensitivity troponin I levels have increased from 18 up to 125.  Chest x-ray shows no acute findings.  No recent reported fevers or chills.  Past Medical History:  Diagnosis Date   BPH (benign prostatic hyperplasia)    CAD (coronary artery disease)    Nonobstructive at cardiac catheterization 2017   Colon polyps    Full dentures    History of colon polyps    History of  kidney stones    1970's   HTN (hypertension)    Hyperlipidemia    Lower urinary tract symptoms (LUTS)    Osteoarthritis    Sciatica    Wears glasses     Past Surgical History:  Procedure Laterality Date   ANTERIOR CERVICAL DECOMP/DISCECTOMY FUSION  05-24-2009   C3 -- C7   APPENDECTOMY  age 78   CARDIAC CATHETERIZATION N/A 07/06/2016   Procedure: Left Heart Cath and Coronary Angiography;  Surgeon: Jettie Booze, MD;  Location: [facility];  Service: Cardiovascular;  Laterality: N/A;   CARDIOVASCULAR STRESS TEST  06-23-2015  dr Daneen Schick   normal lexiscan study/  no perfusion defects or ischemia/  normal LV function and wall motion , ef 57%   CHOLECYSTECTOMY     LEFT HEART CATHETERIZATION WITH CORONARY ANGIOGRAM N/A 01/01/2013   Procedure: LEFT HEART CATHETERIZATION WITH CORONARY ANGIOGRAM;  Surgeon: Sinclair Grooms, MD;  [facility] C[facility];  Service: Cardiovascular;  Laterality: N/A;   Intermediate stenosis in  proximal PDA 50-70%;  dRCA 50%;  ostial and mid CFX 50%;  widely patent LAD;  normal LVF, ef 60%   LUMBAR SPINE SURGERY  1990's   SHOULDER SURGERY Left    TRANSURETHRAL RESECTION OF PROSTATE N/A 07/04/2015   Procedure: TRANSURETHRAL RESECTION OF THE PROSTATE WITH GYRUS INSTRUMENTS;  Surgeon: Franchot Gallo, MD;  Location: [facility];  Service: Urology;  Laterality: N/A;     Outpatient Medications: Current Facility-Administered Medications on File Prior  to Encounter  Medication Dose Route Frequency Provider Last Rate Last Admin   0.9 %  sodium chloride infusion  500 mL Intravenous Continuous Ladene Artist, MD       0.9 %  sodium chloride infusion  500 mL Intravenous Once Ladene Artist, MD       Current Outpatient Medications on File Prior to Encounter  Medication Sig Dispense Refill   ezetimibe (ZETIA) 10 MG tablet Take 10 mg by mouth daily.     glipiZIDE (GLUCOTROL) 5 MG tablet Take 5 mg by mouth every morning.     metoprolol  (LOPRESSOR) 50 MG tablet Take 1 tablet (50 mg total) by mouth 2 (two) times daily. 60 tablet 11   pravastatin (PRAVACHOL) 20 MG tablet Take 20 mg by mouth once a week.     aspirin EC 81 MG tablet Take 81 mg by mouth daily.       Allergies:    Allergies  Allergen Reactions   Codeine Nausea And Vomiting and Other (See Comments)    "deathly sick"   Lipitor [Atorvastatin] Other (See Comments)    Joint pain    Social History:   Social History   Tobacco Use   Smoking status: Former    Years: 12.00    Types: Cigarettes    Quit date: 06/27/1964    Years since quitting: 57.2   Smokeless tobacco: Never  Substance Use Topics   Alcohol use: No     Family History:   The patient's family history includes Dementia in his father; Heart attack in an other family member. There is no history of Colon cancer, Rectal cancer, or Stomach cancer.  ROS:  Please see the history of present illness.  All other ROS reviewed and negative.     Physical Exam/Data:   Vitals:   09/20/21 1500 09/20/21 1515 09/20/21 1530 09/20/21 1545  BP: 126/80  (!) 167/98   Pulse: (!) 59 64 (!) 57 60  Resp: 12 19 14 17   Temp:      TempSrc:      SpO2: 98% 96% 98% 98%  Weight:      Height:       No intake or output data in the 24 hours ending 09/20/21 1607 Filed Weights   09/20/21 1151  Weight: 97.1 kg   Body mass index is 31.6 kg/m.   Gen: Elderly male, no acute distress. HEENT: Conjunctiva and lids normal, wearing a mask. Neck: Supple, no elevated JVP or carotid bruits, no thyromegaly. Lungs: Clear to auscultation, nonlabored breathing at rest. Cardiac: Regular rate and rhythm, no S3 or significant systolic murmur, no pericardial rub. Abdomen: Soft, nontender, no hepatomegaly, bowel sounds present, no guarding or rebound. Extremities: No pitting edema, distal pulses 2+. Skin: Warm and dry. Musculoskeletal: No kyphosis. Neuropsychiatric: Alert and oriented x3, affect grossly appropriate.  EKG:  An ECG  dated 09/20/2021 was personally reviewed today and demonstrated:  Sinus rhythm with possible old injury infarct pattern.  Telemetry:  I personally reviewed telemetry which shows sinus rhythm.  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 This lesion appears improved compared to the prior study. Nonobstructive coronary artery disease. The left ventricular ejection fraction is 55-65% by visual estimate. The left ventricular systolic function is normal. There is no aortic valve stenosis.   Continue aggressive secondary prevention.  Echocardiogram 07/06/2016: - Left ventricle: The cavity size was normal. Systolic function was    normal. The estimated ejection fraction was in the range  of 55%    to 60%. Wall motion was normal; there were no regional wall    motion abnormalities. Left ventricular diastolic function    parameters were normal.  - Aortic valve: Trileaflet; mildly thickened, mildly calcified    leaflets.  - Mitral valve: There was mild regurgitation.   Laboratory Data:  Chemistry Recent Labs  Lab 09/20/21 1145  NA 137  K 4.1  CL 106  CO2 26  GLUCOSE 184*  BUN 17  CREATININE 1.09  CALCIUM 8.9  GFRNONAA >60  ANIONGAP 5     Hematology Recent Labs  Lab 09/20/21 1145  WBC 7.8  RBC 5.12  HGB 15.6  HCT 47.4  MCV 92.6  MCH 30.5  MCHC 32.9  RDW 12.6  PLT 174   Cardiac Enzymes Recent Labs  Lab 09/20/21 1145 09/20/21 1415  TROPONINIHS 18* 125*    Radiology/Studies:  DG Chest Portable 1 View  Result Date: 09/20/2021 CLINICAL DATA:  Chest pain EXAM: PORTABLE CHEST 1 VIEW COMPARISON:  Chest x-ray dated July 05, 2016 FINDINGS: Cardiac and mediastinal contours are unchanged and within normal limits for AP technique. Clear lungs. No pleural effusion or pneumothorax. IMPRESSION: No active disease. Electronically Signed   By: Yetta Glassman M.D.   On: 09/20/2021 12:12    Assessment and Plan:   1.  Unstable  angina with minor but increasing high-sensitivity troponin I levels and possibility of evolving NSTEMI.  ECG shows no acute ST segment changes, possible old inferior infarct pattern.  He has a history of nonobstructive CAD last assessed at cardiac catheterization in 2017, also hypertension and hyperlipidemia.  2.  Nonobstructive CAD as of 2017, LVEF 55 to 60% at that time.  As an outpatient he has been on aspirin, Lopressor, Pravachol, and Zetia.  3.  Statin intolerance to Lipitor, myalgias.  4.  Headache and reported confusion on Saturday, presently resolved without focal neurological findings.  Patient is being admitted to the hospitalist team.  Continue aspirin, beta-blocker, statin, and IV heparin for now.  Would however recommend brain scan given transient neurological symptoms on Saturday.  Would order echocardiogram and continue to trend cardiac enzymes.  Anticipate diagnostic cardiac catheterization, we will work on timing of this, most likely at [name] tomorrow.  Would work on bed for transfer as well.  Signed, Rozann Lesches, MD  09/20/2021 4:07 PM

## 2021-09-20 NOTE — ED Notes (Signed)
Meal tray ordered 

## 2021-09-20 NOTE — ED Triage Notes (Signed)
Pt with CP since 0900 this morning, took 324 ASA at home.  Ems arrived and BP 190/112, IV started to lt AC 18 G and 2 SL NTG given en route. Pain did decreased from 8 to 3.  Reported that pain radiated to both arms and hands. Cbg 176 checked by EMS. Pt points to left for CP.

## 2021-09-21 ENCOUNTER — Encounter (HOSPITAL_COMMUNITY): Payer: Self-pay | Admitting: Cardiovascular Disease

## 2021-09-21 ENCOUNTER — Inpatient Hospital Stay (HOSPITAL_COMMUNITY)
Admission: EM | Disposition: A | Discharge status: Home / Self Care | Payer: Self-pay | Source: Home / Self Care | Attending: Cardiovascular Disease

## 2021-09-21 ENCOUNTER — Other Ambulatory Visit (HOSPITAL_COMMUNITY): Payer: Medicare Other

## 2021-09-21 ENCOUNTER — Observation Stay (HOSPITAL_COMMUNITY): Payer: Medicare Other

## 2021-09-21 DIAGNOSIS — D62 Acute posthemorrhagic anemia: Secondary | ICD-10-CM | POA: Diagnosis not present

## 2021-09-21 DIAGNOSIS — R079 Chest pain, unspecified: Secondary | ICD-10-CM

## 2021-09-21 DIAGNOSIS — R110 Nausea: Secondary | ICD-10-CM | POA: Diagnosis not present

## 2021-09-21 DIAGNOSIS — I200 Unstable angina: Secondary | ICD-10-CM | POA: Diagnosis not present

## 2021-09-21 DIAGNOSIS — Z7982 Long term (current) use of aspirin: Secondary | ICD-10-CM | POA: Diagnosis not present

## 2021-09-21 DIAGNOSIS — Z87891 Personal history of nicotine dependence: Secondary | ICD-10-CM | POA: Diagnosis not present

## 2021-09-21 DIAGNOSIS — J90 Pleural effusion, not elsewhere classified: Secondary | ICD-10-CM | POA: Diagnosis not present

## 2021-09-21 DIAGNOSIS — Z79899 Other long term (current) drug therapy: Secondary | ICD-10-CM | POA: Diagnosis not present

## 2021-09-21 DIAGNOSIS — D696 Thrombocytopenia, unspecified: Secondary | ICD-10-CM | POA: Diagnosis not present

## 2021-09-21 DIAGNOSIS — R579 Shock, unspecified: Secondary | ICD-10-CM | POA: Diagnosis not present

## 2021-09-21 DIAGNOSIS — E119 Type 2 diabetes mellitus without complications: Secondary | ICD-10-CM

## 2021-09-21 DIAGNOSIS — Z7984 Long term (current) use of oral hypoglycemic drugs: Secondary | ICD-10-CM | POA: Diagnosis not present

## 2021-09-21 DIAGNOSIS — I10 Essential (primary) hypertension: Secondary | ICD-10-CM | POA: Diagnosis present

## 2021-09-21 DIAGNOSIS — E785 Hyperlipidemia, unspecified: Secondary | ICD-10-CM | POA: Diagnosis present

## 2021-09-21 DIAGNOSIS — I25110 Atherosclerotic heart disease of native coronary artery with unstable angina pectoris: Secondary | ICD-10-CM | POA: Diagnosis present

## 2021-09-21 DIAGNOSIS — Z9911 Dependence on respirator [ventilator] status: Secondary | ICD-10-CM | POA: Diagnosis not present

## 2021-09-21 DIAGNOSIS — Z981 Arthrodesis status: Secondary | ICD-10-CM | POA: Diagnosis not present

## 2021-09-21 DIAGNOSIS — N400 Enlarged prostate without lower urinary tract symptoms: Secondary | ICD-10-CM | POA: Diagnosis present

## 2021-09-21 DIAGNOSIS — Z888 Allergy status to other drugs, medicaments and biological substances status: Secondary | ICD-10-CM | POA: Diagnosis not present

## 2021-09-21 DIAGNOSIS — Z951 Presence of aortocoronary bypass graft: Secondary | ICD-10-CM | POA: Diagnosis not present

## 2021-09-21 DIAGNOSIS — J9601 Acute respiratory failure with hypoxia: Secondary | ICD-10-CM | POA: Diagnosis not present

## 2021-09-21 DIAGNOSIS — I214 Non-ST elevation (NSTEMI) myocardial infarction: Secondary | ICD-10-CM | POA: Diagnosis present

## 2021-09-21 DIAGNOSIS — J9811 Atelectasis: Secondary | ICD-10-CM | POA: Diagnosis not present

## 2021-09-21 DIAGNOSIS — E1151 Type 2 diabetes mellitus with diabetic peripheral angiopathy without gangrene: Secondary | ICD-10-CM | POA: Diagnosis not present

## 2021-09-21 DIAGNOSIS — I2510 Atherosclerotic heart disease of native coronary artery without angina pectoris: Secondary | ICD-10-CM

## 2021-09-21 DIAGNOSIS — E861 Hypovolemia: Secondary | ICD-10-CM | POA: Diagnosis not present

## 2021-09-21 DIAGNOSIS — Z20822 Contact with and (suspected) exposure to covid-19: Secondary | ICD-10-CM | POA: Diagnosis present

## 2021-09-21 DIAGNOSIS — M1990 Unspecified osteoarthritis, unspecified site: Secondary | ICD-10-CM | POA: Diagnosis present

## 2021-09-21 DIAGNOSIS — Z820 Family history of epilepsy and other diseases of the nervous system: Secondary | ICD-10-CM | POA: Diagnosis not present

## 2021-09-21 DIAGNOSIS — Z01810 Encounter for preprocedural cardiovascular examination: Secondary | ICD-10-CM | POA: Diagnosis not present

## 2021-09-21 DIAGNOSIS — Z885 Allergy status to narcotic agent status: Secondary | ICD-10-CM | POA: Diagnosis not present

## 2021-09-21 HISTORY — PX: LEFT HEART CATH AND CORONARY ANGIOGRAPHY: CATH118249

## 2021-09-21 LAB — CBG MONITORING, ED
Glucose-Capillary: 146 mg/dL — ABNORMAL HIGH (ref 70–99)
Glucose-Capillary: 149 mg/dL — ABNORMAL HIGH (ref 70–99)

## 2021-09-21 LAB — TROPONIN I (HIGH SENSITIVITY): Troponin I (High Sensitivity): 812 ng/L (ref <18)

## 2021-09-21 LAB — GLUCOSE, CAPILLARY
Glucose-Capillary: 129 mg/dL — ABNORMAL HIGH (ref 70–99)
Glucose-Capillary: 170 mg/dL — ABNORMAL HIGH (ref 70–99)

## 2021-09-21 LAB — HEMOGLOBIN A1C
Hgb A1c MFr Bld: 6.8 % — ABNORMAL HIGH (ref 4.8–5.6)
Mean Plasma Glucose: 148.46 mg/dL

## 2021-09-21 LAB — HEPARIN LEVEL (UNFRACTIONATED)
Heparin Unfractionated: 0.21 IU/mL — ABNORMAL LOW (ref 0.30–0.70)
Heparin Unfractionated: 0.2 IU/mL — ABNORMAL LOW (ref 0.30–0.70)
Heparin Unfractionated: 0.2 IU/mL — ABNORMAL LOW (ref 0.30–0.70)

## 2021-09-21 LAB — LIPID PANEL
Cholesterol: 139 mg/dL (ref 0–200)
HDL: 32 mg/dL — ABNORMAL LOW (ref >40)
LDL Cholesterol: 79 mg/dL (ref 0–99)
Total CHOL/HDL Ratio: 4.3 RATIO
Triglycerides: 142 mg/dL (ref <150)
VLDL: 28 mg/dL (ref 0–40)

## 2021-09-21 LAB — CBC
HCT: 46.7 % (ref 39.0–52.0)
Hemoglobin: 15.7 g/dL (ref 13.0–17.0)
MCH: 31 pg (ref 26.0–34.0)
MCHC: 33.6 g/dL (ref 30.0–36.0)
MCV: 92.3 fL (ref 80.0–100.0)
Platelets: 158 10*3/uL (ref 150–400)
RBC: 5.06 MIL/uL (ref 4.22–5.81)
RDW: 12.8 % (ref 11.5–15.5)
WBC: 7.9 10*3/uL (ref 4.0–10.5)
nRBC: 0 % (ref 0.0–0.2)

## 2021-09-21 SURGERY — LEFT HEART CATH AND CORONARY ANGIOGRAPHY
Anesthesia: LOCAL

## 2021-09-21 MED ORDER — SODIUM CHLORIDE 0.9 % IV SOLN: INTRAVENOUS | Status: AC

## 2021-09-21 MED ORDER — PRAVASTATIN SODIUM 10 MG PO TABS
20.0000 mg | ORAL_TABLET | Freq: Every day | ORAL | Status: DC
  Administered 2021-09-21: 20 mg via ORAL
  Filled 2021-09-21: qty 2

## 2021-09-21 MED ORDER — HYDRALAZINE HCL 20 MG/ML IJ SOLN
10.0000 mg | INTRAMUSCULAR | Status: AC | PRN
  Administered 2021-09-21: 10 mg via INTRAVENOUS

## 2021-09-21 MED ORDER — NITROGLYCERIN 1 MG/10 ML FOR IR/CATH LAB
INTRA_ARTERIAL | Status: AC
  Filled 2021-09-21: qty 10

## 2021-09-21 MED ORDER — LABETALOL HCL 5 MG/ML IV SOLN: 10.0000 mg | INTRAVENOUS | Status: AC | PRN

## 2021-09-21 MED ORDER — SODIUM CHLORIDE 0.9% FLUSH
3.0000 mL | Freq: Two times a day (BID) | INTRAVENOUS | Status: DC
  Administered 2021-09-23 – 2021-09-25 (×3): 3 mL via INTRAVENOUS

## 2021-09-21 MED ORDER — SODIUM CHLORIDE 0.9 % WEIGHT BASED INFUSION: 1.0000 mL/kg/h | INTRAVENOUS | Status: DC

## 2021-09-21 MED ORDER — HEPARIN SODIUM (PORCINE) 1000 UNIT/ML IJ SOLN
INTRAMUSCULAR | Status: AC
  Filled 2021-09-21: qty 10

## 2021-09-21 MED ORDER — ACETAMINOPHEN 325 MG PO TABS: 650.0000 mg | ORAL_TABLET | Freq: Four times a day (QID) | ORAL | Status: DC | PRN

## 2021-09-21 MED ORDER — HEPARIN SODIUM (PORCINE) 1000 UNIT/ML IJ SOLN
INTRAMUSCULAR | Status: DC | PRN
  Administered 2021-09-21: 5000 [IU] via INTRAVENOUS

## 2021-09-21 MED ORDER — HEPARIN (PORCINE) IN NACL 1000-0.9 UT/500ML-% IV SOLN
INTRAVENOUS | Status: AC
  Filled 2021-09-21: qty 500

## 2021-09-21 MED ORDER — HYDRALAZINE HCL 20 MG/ML IJ SOLN
INTRAMUSCULAR | Status: AC
  Filled 2021-09-21: qty 1

## 2021-09-21 MED ORDER — METOPROLOL TARTRATE 50 MG PO TABS
50.0000 mg | ORAL_TABLET | Freq: Two times a day (BID) | ORAL | Status: DC
  Administered 2021-09-21 – 2021-09-22 (×3): 50 mg via ORAL
  Filled 2021-09-21 (×3): qty 1

## 2021-09-21 MED ORDER — HEPARIN (PORCINE) IN NACL 1000-0.9 UT/500ML-% IV SOLN
INTRAVENOUS | Status: DC | PRN
  Administered 2021-09-21 (×2): 500 mL

## 2021-09-21 MED ORDER — HEPARIN BOLUS VIA INFUSION
1500.0000 [IU] | Freq: Once | INTRAVENOUS | Status: AC
  Administered 2021-09-21: 1500 [IU] via INTRAVENOUS

## 2021-09-21 MED ORDER — ACETAMINOPHEN 650 MG RE SUPP: 650.0000 mg | Freq: Four times a day (QID) | RECTAL | Status: DC | PRN

## 2021-09-21 MED ORDER — HEPARIN BOLUS VIA INFUSION
2000.0000 [IU] | Freq: Once | INTRAVENOUS | Status: AC
  Administered 2021-09-21: 2000 [IU] via INTRAVENOUS

## 2021-09-21 MED ORDER — INSULIN ASPART 100 UNIT/ML IJ SOLN
0.0000 [IU] | Freq: Four times a day (QID) | INTRAMUSCULAR | Status: DC
  Administered 2021-09-21: 1 [IU] via SUBCUTANEOUS
  Administered 2021-09-22: 2 [IU] via SUBCUTANEOUS
  Administered 2021-09-22 – 2021-09-23 (×3): 1 [IU] via SUBCUTANEOUS
  Administered 2021-09-23: 2 [IU] via SUBCUTANEOUS
  Filled 2021-09-21: qty 1

## 2021-09-21 MED ORDER — HEPARIN (PORCINE) 25000 UT/250ML-% IV SOLN
1650.0000 [IU]/h | INTRAVENOUS | Status: DC
  Administered 2021-09-21: 1450 [IU]/h via INTRAVENOUS
  Administered 2021-09-22 – 2021-09-25 (×6): 1650 [IU]/h via INTRAVENOUS
  Filled 2021-09-21 (×8): qty 250

## 2021-09-21 MED ORDER — ONDANSETRON HCL 4 MG/2ML IJ SOLN: 4.0000 mg | Freq: Four times a day (QID) | INTRAMUSCULAR | Status: DC | PRN

## 2021-09-21 MED ORDER — SODIUM CHLORIDE 0.9 % WEIGHT BASED INFUSION
3.0000 mL/kg/h | INTRAVENOUS | Status: DC
  Administered 2021-09-21: 3 mL/kg/h via INTRAVENOUS

## 2021-09-21 MED ORDER — ASPIRIN 81 MG PO CHEW
81.0000 mg | CHEWABLE_TABLET | ORAL | Status: AC
  Administered 2021-09-21: 81 mg via ORAL
  Filled 2021-09-21: qty 1

## 2021-09-21 MED ORDER — SODIUM CHLORIDE 0.9% FLUSH: 3.0000 mL | INTRAVENOUS | Status: DC | PRN

## 2021-09-21 MED ORDER — ONDANSETRON HCL 4 MG PO TABS: 4.0000 mg | ORAL_TABLET | Freq: Four times a day (QID) | ORAL | Status: DC | PRN

## 2021-09-21 MED ORDER — EZETIMIBE 10 MG PO TABS
10.0000 mg | ORAL_TABLET | Freq: Every day | ORAL | Status: DC
  Administered 2021-09-21 – 2021-09-25 (×5): 10 mg via ORAL
  Filled 2021-09-21 (×5): qty 1

## 2021-09-21 MED ORDER — VERAPAMIL HCL 2.5 MG/ML IV SOLN
INTRAVENOUS | Status: AC
  Filled 2021-09-21: qty 2

## 2021-09-21 MED ORDER — ASPIRIN 81 MG PO CHEW: 81.0000 mg | CHEWABLE_TABLET | ORAL | Status: DC

## 2021-09-21 MED ORDER — ASPIRIN 81 MG PO CHEW
81.0000 mg | CHEWABLE_TABLET | Freq: Every day | ORAL | Status: DC
  Administered 2021-09-22 – 2021-09-25 (×4): 81 mg via ORAL
  Filled 2021-09-21 (×4): qty 1

## 2021-09-21 MED ORDER — VERAPAMIL HCL 2.5 MG/ML IV SOLN
INTRAVENOUS | Status: DC | PRN
  Administered 2021-09-21: 10 mL via INTRA_ARTERIAL

## 2021-09-21 MED ORDER — PRAVASTATIN SODIUM 10 MG PO TABS: 20.0000 mg | ORAL_TABLET | ORAL | Status: DC

## 2021-09-21 MED ORDER — SODIUM CHLORIDE 0.9 % WEIGHT BASED INFUSION
1.0000 mL/kg/h | INTRAVENOUS | Status: DC
  Administered 2021-09-21: 1 mL/kg/h via INTRAVENOUS

## 2021-09-21 MED ORDER — SODIUM CHLORIDE 0.9 % WEIGHT BASED INFUSION: 3.0000 mL/kg/h | INTRAVENOUS | Status: DC

## 2021-09-21 MED ORDER — LORAZEPAM 2 MG/ML IJ SOLN: 0.5000 mg | Freq: Once | INTRAMUSCULAR | Status: DC

## 2021-09-21 MED ORDER — LIDOCAINE HCL (PF) 1 % IJ SOLN
INTRAMUSCULAR | Status: DC | PRN
  Administered 2021-09-21: 2 mL

## 2021-09-21 MED ORDER — SODIUM CHLORIDE 0.9 % IV SOLN: 250.0000 mL | INTRAVENOUS | Status: DC | PRN

## 2021-09-21 MED ORDER — LIDOCAINE HCL (PF) 1 % IJ SOLN
INTRAMUSCULAR | Status: AC
  Filled 2021-09-21: qty 30

## 2021-09-21 MED ORDER — ASPIRIN EC 81 MG PO TBEC: 81.0000 mg | DELAYED_RELEASE_TABLET | Freq: Every day | ORAL | Status: DC

## 2021-09-21 MED ORDER — ACETAMINOPHEN 325 MG PO TABS
650.0000 mg | ORAL_TABLET | ORAL | Status: DC | PRN
  Administered 2021-09-22: 650 mg via ORAL
  Filled 2021-09-21: qty 2

## 2021-09-21 MED ORDER — POLYETHYLENE GLYCOL 3350 17 G PO PACK: 17.0000 g | PACK | Freq: Every day | ORAL | Status: DC | PRN

## 2021-09-21 SURGICAL SUPPLY — 12 items
CATH INFINITI 5FR ANG PIGTAIL (CATHETERS) ×1 IMPLANT
CATH OPTITORQUE TIG 4.0 5F (CATHETERS) ×1 IMPLANT
DEVICE RAD COMP TR BAND LRG (VASCULAR PRODUCTS) ×1 IMPLANT
GLIDESHEATH SLEND SS 6F .021 (SHEATH) ×1 IMPLANT
GUIDEWIRE INQWIRE 1.5J.035X260 (WIRE) IMPLANT
INQWIRE 1.5J .035X260CM (WIRE) ×2
KIT HEART LEFT (KITS) ×2 IMPLANT
PACK CARDIAC CATHETERIZATION (CUSTOM PROCEDURE TRAY) ×2 IMPLANT
SHEATH PROBE COVER 6X72 (BAG) ×1 IMPLANT
TRANSDUCER W/STOPCOCK (MISCELLANEOUS) ×2 IMPLANT
TUBING CIL FLEX 10 FLL-RA (TUBING) ×2 IMPLANT
WIRE HI TORQ VERSACORE-J 145CM (WIRE) ×1 IMPLANT

## 2021-09-21 NOTE — Interval H&P Note (Signed)
Cath Lab Visit (complete for each Cath Lab visit)  Clinical Evaluation Leading to the Procedure:   ACS: Yes.    Non-ACS:    Anginal Classification: CCS II  Anti-ischemic medical therapy: No Therapy  Non-Invasive Test Results: No non-invasive testing performed  Prior CABG: No previous CABG      History and Physical Interval Note:  09/21/2021 1:02 PM  William [name]  has presented today for surgery, with the diagnosis of nstemi.  The various methods of treatment have been discussed with the patient and family. After consideration of risks, benefits and other options for treatment, the patient has consented to  Procedure(s): LEFT HEART CATH AND CORONARY ANGIOGRAPHY (N/A) as a surgical intervention.  The patient's history has been reviewed, patient examined, no change in status, stable for surgery.  I have reviewed the patient's chart and labs.  Questions were answered to the patient's satisfaction.     Quay Burow

## 2021-09-21 NOTE — Consult Note (Addendum)
[address]       [place] [address]             [phone]        William [name] [place] Medical Record #[id number] Date of Birth: [birth year]  Referring: No ref. provider found Primary Care: Lawerance Cruel, MD Primary Cardiologist:Henry Nicholes Stairs III, MD  Reason for Consult: Evaluation for management of left main and multivessel coronary artery disease presenting with acute non-ST elevation myocardial infarction.   History of Present Illness:     William [name] is a very pleasant 80 year old gentleman with known coronary artery disease dating back to 2017.  He has been managed medically with aspirin, metoprolol, and pravastatin.  He also has a history of hypertension, dyslipidemia, type 2 diabetes mellitus and benign prostatic hyperplasia.  He was brought to [facility] yesterday by way of EMS after experiencing chest tightness that radiated to both arms and into his back.  His wife also reported that he had an episode of confusion, memory loss, and posterior headache this past weekend.  The symptoms resolved after lasting for about 45 minutes.    Evaluation in the emergency room included an EKG that showed possible inferior infarct.  High-sensitivity troponin was initially 18 and trended up to 125 by yesterday afternoon and was 812 by midnight last night.  Chest x-ray showed no acute abnormalities.  His chest pain subsided after receiving sublingual nitroglycerin.  He was admitted to [facility] and started on a heparin infusion.  He remained stable and was transferred to [facility] today for further evaluation and possible intervention.  An MRI of his brain obtained earlier today showed no acute abnormality.  He went on to have left heart catheterization this afternoon.  This demonstrates left main and multivessel coronary artery disease.  Specifically, there is a 90% mid left main coronary artery stenosis.  There is a 75% stenosis in  the mid segment of OM 2.  There is also a 90% stenosis in the midportion of the RPDA.  Left ventricular ejection fraction was 50 to 55% by visual estimate on left ventriculogram.  Mr. [name] has remained stable since admission and denies any further pain.  He does say he has some occasional sensation of tightness in his chest.  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  He considers himself pretty healthy overall and he said he and his wife live a fairly active lifestyle.  He has had 2 back surgeries and has had rotator cuff repair on both shoulders.  He is also status post transurethral resection of the prostate and cholecystectomy.  He is edentulous and has full upper and lower plate dentures.  Current Activity/ Functional Status:   Zubrod Score: At the time of surgery this patient's most appropriate activity status/level should be described as: []     0    Normal activity, no symptoms [x]     1    Restricted in physical strenuous activity but ambulatory, able to do out light work []     2    Ambulatory and capable of self care, unable to do work activities, up and about                 more than 50%  Of the time                            []   3    Only limited self care, in bed greater than 50% of waking hours []     4    Completely disabled, no self care, confined to bed or chair []     5    Moribund  Past Medical History:  Diagnosis Date   BPH (benign prostatic hyperplasia)    CAD (coronary artery disease)    Nonobstructive at cardiac catheterization 2017   Colon polyps    Full dentures    History of colon polyps    History of kidney stones    1970's   HTN (hypertension)    Hyperlipidemia    Lower urinary tract symptoms (LUTS)    Osteoarthritis    Sciatica    Wears glasses     Past Surgical History:  Procedure Laterality Date   ANTERIOR CERVICAL DECOMP/DISCECTOMY FUSION  05-24-2009   C3 -- C7   APPENDECTOMY  age 62   CARDIAC  CATHETERIZATION N/A 07/06/2016   Procedure: Left Heart Cath and Coronary Angiography;  Surgeon: Jettie Booze, MD;  Location: [facility];  Service: Cardiovascular;  Laterality: N/A;   CARDIOVASCULAR STRESS TEST  06-23-2015  dr Daneen Schick   normal lexiscan study/  no perfusion defects or ischemia/  normal LV function and wall motion , ef 57%   CHOLECYSTECTOMY     LEFT HEART CATHETERIZATION WITH CORONARY ANGIOGRAM N/A 01/01/2013   Procedure: LEFT HEART CATHETERIZATION WITH CORONARY ANGIOGRAM;  Surgeon: Sinclair Grooms, MD;  Location: [facility];  Service: Cardiovascular;  Laterality: N/A;   Intermediate stenosis in  proximal PDA 50-70%;  dRCA 50%;  ostial and mid CFX 50%;  widely patent LAD;  normal LVF, ef 60%   LUMBAR SPINE SURGERY  1990's   SHOULDER SURGERY Left    TRANSURETHRAL RESECTION OF PROSTATE N/A 07/04/2015   Procedure: TRANSURETHRAL RESECTION OF THE PROSTATE WITH GYRUS INSTRUMENTS;  Surgeon: Franchot Gallo, MD;  Location: [facility];  Service: Urology;  Laterality: N/A;    Social History   Tobacco Use  Smoking Status Former   Years: 12.00   Types: Cigarettes   Quit date: 06/27/1964   Years since quitting: 57.2  Smokeless Tobacco Never    Social History   Substance and Sexual Activity  Alcohol Use No     Allergies  Allergen Reactions   Codeine Nausea And Vomiting and Other (See Comments)    "deathly sick"   Lipitor [Atorvastatin] Other (See Comments)    Joint pain    Current Facility-Administered Medications  Medication Dose Route Frequency Provider Last Rate Last Admin   0.9 %  sodium chloride infusion   Intravenous Continuous Lorretta Harp, MD 75 mL/hr at 09/21/21 1410 Rate Change at 09/21/21 1410   0.9% sodium chloride infusion  1 mL/kg/hr Intravenous Continuous Lorretta Harp, MD 97.1 mL/hr at 09/21/21 0857 1 mL/kg/hr at 09/21/21 0857   [MAR Hold] acetaminophen (TYLENOL) tablet 650 mg  650 mg Oral Q6H PRN Emokpae,  Ejiroghene E, MD       Or   [MAR Hold] acetaminophen (TYLENOL) suppository 650 mg  650 mg Rectal Q6H PRN Emokpae, Ejiroghene E, MD       [MAR Hold] aspirin EC tablet 81 mg  81 mg Oral Daily Emokpae, Ejiroghene E, MD       [MAR Hold] ezetimibe (ZETIA) tablet 10 mg  10 mg Oral Daily Emokpae, Ejiroghene E, MD   10 mg at 09/21/21 0907   Heparin (Porcine) in NaCl 1000-0.9 UT/500ML-% SOLN  PRN Lorretta Harp, MD   500 mL at 09/21/21 1305   heparin ADULT infusion 100 units/mL (25000 units/246mL)  1,450 Units/hr Intravenous Continuous Jonetta Osgood, MD 14.5 mL/hr at 09/21/21 0905 1,450 Units/hr at 09/21/21 0905   heparin sodium (porcine) injection    PRN Lorretta Harp, MD   5,000 Units at 09/21/21 1328   hydrALAZINE (APRESOLINE) injection 10 mg  10 mg Intravenous Q20 Min PRN Lorretta Harp, MD   10 mg at 09/21/21 1420   [MAR Hold] insulin aspart (novoLOG) injection 0-9 Units  0-9 Units Subcutaneous Q6H Emokpae, Ejiroghene E, MD   1 Units at 09/21/21 0729   labetalol (NORMODYNE) injection 10 mg  10 mg Intravenous Q10 min PRN Lorretta Harp, MD       lidocaine (PF) (XYLOCAINE) 1 % injection    PRN Lorretta Harp, MD   2 mL at 09/21/21 1314   [MAR Hold] LORazepam (ATIVAN) injection 0.5 mg  0.5 mg Intravenous Once Emokpae, Ejiroghene E, MD       [MAR Hold] metoprolol tartrate (LOPRESSOR) tablet 50 mg  50 mg Oral BID Emokpae, Ejiroghene E, MD   50 mg at 09/21/21 0907   [MAR Hold] ondansetron (ZOFRAN) tablet 4 mg  4 mg Oral Q6H PRN Emokpae, Ejiroghene E, MD       Or   [MAR Hold] ondansetron (ZOFRAN) injection 4 mg  4 mg Intravenous Q6H PRN Emokpae, Ejiroghene E, MD       [MAR Hold] polyethylene glycol (MIRALAX / GLYCOLAX) packet 17 g  17 g Oral Daily PRN Emokpae, Ejiroghene E, MD       [MAR Hold] pravastatin (PRAVACHOL) tablet 20 mg  20 mg Oral q1800 Ahmed Prima, [place] M, PA-C   20 mg at 09/21/21 2409   Radial Cocktail/Verapamil only    PRN Lorretta Harp, MD   10 mL at 09/21/21 1326     Facility-Administered Medications Prior to Admission  Medication Dose Route Frequency Provider Last Rate Last Admin   0.9 %  sodium chloride infusion  500 mL Intravenous Continuous Ladene Artist, MD       0.9 %  sodium chloride infusion  500 mL Intravenous Once Ladene Artist, MD       Medications Prior to Admission  Medication Sig Dispense Refill Last Dose   ezetimibe (ZETIA) 10 MG tablet Take 10 mg by mouth daily.   09/20/2021   glipiZIDE (GLUCOTROL) 5 MG tablet Take 5 mg by mouth every morning.   09/20/2021   metoprolol (LOPRESSOR) 50 MG tablet Take 1 tablet (50 mg total) by mouth 2 (two) times daily. 60 tablet 11 09/20/2021 at 0800   pravastatin (PRAVACHOL) 20 MG tablet Take 20 mg by mouth once a week.   Past Week   aspirin EC 81 MG tablet Take 81 mg by mouth daily.       Family History  Problem Relation Age of Onset   Dementia Father    Heart attack Other    Colon cancer Neg Hx    Rectal cancer Neg Hx    Stomach cancer Neg Hx      Review of Systems:   ROS    Cardiac Review of Systems: Y or  [    ]= no  Chest Pain [   x ]  Resting SOB [   ] Exertional SOB  [ x ]  Orthopnea [  ]   Pedal Edema [   ]    Palpitations [  ]  Syncope  [  ]   Presyncope [   ]  General Review of Systems: [Y] = yes [  ]=no Constitional: recent weight change [  ]; anorexia [  ]; fatigue [  ]; nausea [  ]; night sweats [  ]; fever [  ]; or chills [  ]                                                               Dental: He is edentulous  Eye : blurred vision [  ]; diplopia [   ]; vision changes [  ];  Amaurosis fugax[  ]; Resp: cough [  ];  wheezing[  ];  hemoptysis[  ]; shortness of breath[x  ]; paroxysmal nocturnal dyspnea[  ]; dyspnea on exertion[  ]; or orthopnea[  ];  GI:  gallstones[  ], vomiting[  ];  dysphagia[  ]; melena[  ];  hematochezia [  ]; heartburn[  ];   Hx of  Colonoscopy[  ]; GU: kidney stones [  ]; hematuria[  ];   dysuria [  ];  nocturia[  ];  history of     obstruction [   ]; urinary frequency [  ]             Skin: rash, swelling[  ];, hair loss[  ];  peripheral edema[  ];  or itching[  ]; Musculosketetal: myalgias[  ];  joint swelling[  ];  joint erythema[  ];  joint pain[  ];  back pain[  ];  Heme/Lymph: bruising[  ];  bleeding[  ];  anemia[  ];  Neuro: TIA[  ];  headaches[ x ];  stroke[  ];  vertigo[  ];  seizures[  ];   paresthesias[  ];  difficulty walking[  ];  Psych:depression[  ]; anxiety[  ];  Endocrine: diabetes[ x ];  thyroid dysfunction[  ];                  Physical Exam: BP (!) 158/77   Pulse 68   Temp 98.1 F (36.7 C) (Oral)   Resp (!) 26   Ht 5\' 9"  (1.753 m)   Wt 97.1 kg   SpO2 96%   BMI 31.60 kg/m    General appearance: alert, cooperative, and no distress Head: Normocephalic, without obvious abnormality, atraumatic Neck: no adenopathy, no carotid bruit, no JVD, and supple, symmetrical, trachea midline Resp: clear to auscultation bilaterally Back: negative, symmetric, no curvature. ROM normal. No CVA tenderness. Cardio: regular rate and rhythm and no murmur GI: soft, non-tender; bowel sounds normal; no masses,  no organomegaly Extremities: A TR band is present over the right radial artery.  He has a palpable radial pulse distal to the band.  He also has easily palpable pulses in the left wrist and in both feet.  He has mild superficial varicosities in his lower extremities..  No obvious deformities. Neurologic: Grossly normal  Diagnostic Studies & Laboratory data:    LEFT HEART CATH AND CORONARY ANGIOGRAPHY   Conclusion      2nd Mrg lesion is 75% stenosed.   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   Mid LM lesion is 90% stenosed.   The left ventricular systolic function is normal.   LV end diastolic pressure is mildly elevated.  The left ventricular ejection fraction is 50-55% by visual estimate.   T[facility]que [facility] is a 80 y.o. male      [id number] LOCATION:  FACILITY: [facility]  PHYSICIAN: Quay Burow, M.D. [birth year]      DATE OF PROCEDURE:  09/21/2021   DATE OF DISCHARGE:       CARDIAC CATHETERIZATION        History obtained from chart review.80 y.o. male w/ PMH of CAD (nonobstructive CAD by cath in 2017), HTN and HLD who presented to [facility] ED on 09/20/2021 for evaluation of chest pain. Found to have an NSTEMI.       IMPRESSION: Mr. Canter has left main/three-vessel disease.  He was presented with chest pain and had a "non-STEMI".  He said no further pain since hospitalization and IV heparin.  He will need CABG for complete revascularization.  The sheath was removed and a TR band was placed on the right wrist to achieve patent hemostasis.  The patient left lab in stable condition.  Heparin will be restarted in 2 hours without a bolus.   Quay Burow. MD, [facility] 09/21/2021 1:57 PM         Procedural Details  Technical Details PROCEDURE DESCRIPTION:   The patient was brought to the second floor [facility] cath lab in the postabsorptive state. He was not premedicated . His right wrist was prepped and shaved in usual sterile fashion. Xylocaine 1% was used for local anesthesia. A 6 French sheath was inserted into the right radial artery using standard Seldinger technique. The patient received 5000 units  of heparin intravenously.  A 5 [place] TIG catheter and pigtail catheter were used for selective coronary angiography and left ventriculography respectively.  Isovue dye was used for the entirety of the case (35 cc of contrast total to patient).  Retrograde aortic, left ventricular and pullback pressures were recorded.  Radial cocktail was administered via the SideArm sheath. Estimated blood loss <50 mL.   During this procedure no sedation was administered.   Medications (Filter: Administrations occurring from 1301 to 1343 on 09/21/21)  important  Continuous medications are totaled by the amount administered until 09/21/21 1343.    Heparin (Porcine) in NaCl 1000-0.9 UT/500ML-% SOLN  (mL) Total volume:  1,000 mL  Date/Time Rate/Dose/Volume Action   09/21/21 1305 500 mL Given   1305 500 mL Given    lidocaine (PF) (XYLOCAINE) 1 % injection (mL) Total volume:  2 mL  Date/Time Rate/Dose/Volume Action   09/21/21 1314 2 mL Given    Radial Cocktail/Verapamil only (mL) Total volume:  10 mL  Date/Time Rate/Dose/Volume Action   09/21/21 1326 10 mL Given    heparin sodium (porcine) injection (Units) Total dose:  5,000 Units  Date/Time Rate/Dose/Volume Action   09/21/21 1328 5,000 Units Given    Radiation/Fluoro  Fluoro time: 3.9 (min) DAP: 29937 (mGycm2) Cumulative Air Kerma: 169 (mGy)   Complications   Complications documented before study signed (09/21/2021  6:78 PM)    No complications were associated with this study.  Documented by Jamie Kato - 09/21/2021  1:08 PM     Coronary Findings   Diagnostic Dominance: Right  Left Main  Mid LM lesion is 90% stenosed. The lesion is ulcerative.    Left Circumflex    Second Obtuse Marginal Branch  2nd Mrg lesion is 75% stenosed.    Right Coronary Artery    Right Posterior Descending Artery  RPDA lesion is 90% stenosed.     Intervention  No interventions have been documented.   Left Heart  Left Ventricle The left ventricular size is normal. The left ventricular systolic function is normal. LV end diastolic pressure is mildly elevated. The left ventricular ejection fraction is 50-55% by visual estimate. No regional wall motion abnormalities.   Coronary Diagrams   Diagnostic Dominance: Right        Recent Radiology Findings:   DG Chest Portable 1 View  Result Date: 09/20/2021 CLINICAL DATA:  Chest pain EXAM: PORTABLE CHEST 1 VIEW COMPARISON:  Chest x-ray dated July 05, 2016 FINDINGS: Cardiac and mediastinal contours are unchanged and within normal limits for AP technique. Clear lungs. No pleural effusion or pneumothorax. IMPRESSION: No active disease. Electronically  Signed   By: Yetta Glassman M.D.   On: 09/20/2021 12:12      MR BRAIN WO CONTRAST  Result Date: 09/21/2021 CLINICAL DATA:  Acute occipital headache and confusion lasting 45 minutes, 4 days ago, bilateral weakness for 2 days EXAM: MRI HEAD WITHOUT CONTRAST TECHNIQUE: Multiplanar, multiecho pulse sequences of the brain and surrounding structures were obtained without intravenous contrast. COMPARISON:  MR head 12/16/2003 FINDINGS: Brain: There is no evidence of acute intracranial hemorrhage, extra-axial fluid collection, or acute infarct. Parenchymal volume is within normal limits for age. The ventricles are normal in size. There is no parenchymal signal abnormality, with no significant burden of white matter microangiopathic change. There is no solid mass lesion.  There is no midline shift. Vascular: Normal flow voids. Skull and upper cervical spine: Normal marrow signal. Upper cervical spine fusion hardware is noted. Sinuses/Orbits: The imaged paranasal sinuses are clear. Bilateral lens implants are in place. The globes and orbits are otherwise unremarkable. Other: None. IMPRESSION: Normal brain MRI. Electronically Signed   By: Valetta Mole M.D.   On: 09/21/2021 08:58    DG Chest Portable 1 View  Result Date: 09/20/2021 CLINICAL DATA:  Chest pain EXAM: PORTABLE CHEST 1 VIEW COMPARISON:  Chest x-ray dated July 05, 2016 FINDINGS: Cardiac and mediastinal contours are unchanged and within normal limits for AP technique. Clear lungs. No pleural effusion or pneumothorax. IMPRESSION: No active disease. Electronically Signed   By: Yetta Glassman M.D.   On: 09/20/2021 12:12     I have independently reviewed the above radiologic studies and discussed with the patient   Recent Lab Findings: Lab Results  Component Value Date   WBC 7.9 09/21/2021   HGB 15.7 09/21/2021   HCT 46.7 09/21/2021   PLT 158 09/21/2021   GLUCOSE 184 (H) 09/20/2021   CHOL 139 09/21/2021   TRIG 142 09/21/2021   HDL 32 (L)  09/21/2021   LDLCALC 79 09/21/2021   ALT 29 07/18/2016   AST 22 07/18/2016   NA 137 09/20/2021   K 4.1 09/20/2021   CL 106 09/20/2021   CREATININE 1.09 09/20/2021   BUN 17 09/20/2021   CO2 26 09/20/2021   INR 1.08 07/05/2016   HGBA1C 6.8 (H) 09/21/2021      Assessment / Plan:      -Pleasant 80 year old gentleman with known history of coronary artery disease with acute onset chest pain radiating to his arms and back yesterday morning.  He ruled in for acute non-ST elevation myocardial infarction.  Chest pain subsided with nitroglycerin heparin.  Left heart catheterization performed this afternoon demonstrates a 90% mid left main coronary artery stenosis with additional obstructive lesions in the distal OM2 and RPDA.  Left ventricular function is preserved.  Coronary artery bypass grafting is his best option for  management of symptoms and for survival benefit.  The procedure and expected postoperative recovery were discussed with Mr. Lynam and his questions were answered.  He would like for [place] to proceed with preoperative work-up in preparation for surgery as soon as possible.  -Type 2 diabetes mellitus-managed with glipizide alone prior to admission.  Hemoglobin A1c is 6.8  -Hypertension- controlled with metoprolol 50 mg twice daily prior to admission  -Dyslipidemia-intolerant to atorvastatin, taking Zetia and pravastatin prior to admission   Dr. Kipp Brood will review clinical data and coronary angiography.  Discussion regarding timing of surgery will follow.    I  spent 25 minutes counseling the patient face to face.   Antony Odea, PA-C  09/21/2021 2:56 PM   Agree with above.  This is an 80 year old gentleman this admitted following a left heart cath which showed severe left main disease.  He also has some disease off of his RCA.  He does have a history of diabetes and hypertension.  I personally reviewed his left heart cath and echocardiogram.  He does have good  distal targets in all walls.  He also has good biventricular function and no significant valvular disease.  The risks and benefits of surgical revascularization have been discussed and he is agreeable to proceed.

## 2021-09-21 NOTE — H&P (View-Only) (Signed)
Progress Note  Patient Name: William [name] [facility] Date of Encounter: 09/21/2021  [facility] Cardiologist: Sinclair Grooms, MD   Subjective   Did not sleep well overnight. Denies any chest pain or palpitations. Breathing at baseline. He has been NPO since midnight.   Inpatient Medications    Scheduled Meds:  aspirin EC  81 mg Oral Daily   ezetimibe  10 mg Oral Daily   insulin aspart  0-9 Units Subcutaneous Q6H   [START ON 09/22/2021] LORazepam  0.5 mg Intravenous Once   metoprolol tartrate  50 mg Oral BID   pravastatin  20 mg Oral Daily   Continuous Infusions:  sodium chloride     sodium chloride     sodium chloride     heparin 1,250 Units/hr (09/21/21 0038)   PRN Meds: acetaminophen **OR** acetaminophen, ondansetron **OR** ondansetron (ZOFRAN) IV, polyethylene glycol   Vital Signs    Vitals:   09/21/21 0300 09/21/21 0330 09/21/21 0430 09/21/21 0500  BP: 129/69 134/61 140/69 (!) 157/91  Pulse: 67 64 64 67  Resp: 14 16 15 14   Temp:      TempSrc:      SpO2: 96% 93% 94% 97%  Weight:      Height:        Intake/Output Summary (Last 24 hours) at 09/21/2021 0755 Last data filed at 09/20/2021 2200 Gross per 24 hour  Intake --  Output 500 ml  Net -500 ml   Last 3 Weights 09/20/2021 03/03/2020 02/15/2020  Weight (lbs) 214 lb 219 lb 219 lb 12.8 oz  Weight (kg) 97.07 kg 99.338 kg 99.701 kg      Telemetry    NSR, HR in 60's to 70's with occasional PVC's.  - Personally Reviewed  ECG    NSR, HR 62 with no acute ST changes.  - Personally Reviewed  Physical Exam   GEN: Pleasant male appearing in no acute distress.   Neck: No JVD Cardiac: RRR with occasional ectopic beats, no murmurs, rubs, or gallops.  Respiratory: Clear to auscultation bilaterally. GI: Soft, nontender, non-distended  MS: No pitting edema; No deformity. Neuro:  Nonfocal  Psych: Normal affect   Labs    High Sensitivity Troponin:   Recent Labs  Lab 09/20/21 1145 09/20/21 1415  09/20/21 2304  TROPONINIHS 18* 125* 812*     Chemistry Recent Labs  Lab 09/20/21 1145  NA 137  K 4.1  CL 106  CO2 26  GLUCOSE 184*  BUN 17  CREATININE 1.09  CALCIUM 8.9  GFRNONAA >60  ANIONGAP 5    Lipids  Recent Labs  Lab 09/21/21 0353  CHOL 139  TRIG 142  HDL 32*  LDLCALC 79  CHOLHDL 4.3    Hematology Recent Labs  Lab 09/20/21 1145 09/21/21 0353  WBC 7.8 7.9  RBC 5.12 5.06  HGB 15.6 15.7  HCT 47.4 46.7  MCV 92.6 92.3  MCH 30.5 31.0  MCHC 32.9 33.6  RDW 12.6 12.8  PLT 174 158    Radiology    DG Chest Portable 1 View  Result Date: 09/20/2021 CLINICAL DATA:  Chest pain EXAM: PORTABLE CHEST 1 VIEW COMPARISON:  Chest x-ray dated July 05, 2016 FINDINGS: Cardiac and mediastinal contours are unchanged and within normal limits for AP technique. Clear lungs. No pleural effusion or pneumothorax. IMPRESSION: No active disease. Electronically Signed   By: Yetta Glassman M.D.   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 This  lesion appears improved compared to the prior study. Nonobstructive coronary artery disease. The left ventricular ejection fraction is 55-65% by visual estimate. The left ventricular systolic function is normal. There is no aortic valve stenosis.   Continue aggressive secondary prevention.  Patient Profile     80 y.o. male w/ PMH of CAD (nonobstructive CAD by cath in 2017), HTN and HLD who presented to [facility] ED on 09/20/2021 for evaluation of chest pain. Found to have an NSTEMI.   Assessment & Plan    1. NSTEMI - Presented with chest pain which radiated into his arms and subscapular region and started the day of admission.  - He has been found to have an NSTEMI with Hs Troponin values trending up from 18 to 125 and at 812 on most recent check. Echo pending. Scheduled for a cardiac catheterization later today once his MRI has been performed to make sure no  contraindications to DAPT in case he requires intervention. The patient understands that risks include but are not limited to stroke (1 in 1000), death (1 in 46), kidney failure [usually temporary] (1 in 500), bleeding (1 in 200), allergic reaction [possibly serious] (1 in 200).   - Continue IV Heparin, ASA 81mg  daily, Zetia 10mg  daily, Pravastatin 20mg  daily and Lopressor 50mg  BID.   2. Headache/Episode of AMS - Patient's wife reported an episode of him having confusion this past Saturday and he also had a headache at that time. No residual neurological deficits. Brain MRI pending.   3. HTN - BP is elevated at 157/91 today but he has not yet received his AM medications. Continue Lopressor 50mg  BID.   4. HLD - FLP shows total cholesterol of 139, HDL 32 and LDL 79. Previously intolerant to Atorvastatin. He was taking Pravastatin 20mg  once weekly and Zetia 10mg  daily prior to admission. Will titrate Pravastatin to daily dosing. Could also re-challenge with Crestor if LDL remains above goal.   For questions or updates, please contact [place] Please consult [email or web address] for contact info under        Signed, Erma Heritage, PA-C  09/21/2021, 7:55 AM     Attending note:  Patient remains in ER awaiting transfer to [facility] for diagnostic cardiac catheterization.  I agree with above assessment by Ms. Strader PA-C.  He remains chest pain-free.  High-sensitivity troponin I increased to 812 consistent with NSTEMI.  He is n.p.o. at this time.  Hemodynamically stable although with increased blood pressure this morning.  Subsequent lab work shows LDL 79, hemoglobin 15.7, platelets 158.  COVID-19 and influenza A negative.  Brain MRI is normal.  Continue aspirin, Pravachol, Zetia, Lopressor, and IV heparin.  Keep NPO.  Satira Sark, M.D., F.A.C.C.

## 2021-09-21 NOTE — ED Notes (Signed)
PA in room to assess patient and states that he may take PO meds with sips of water.

## 2021-09-21 NOTE — ED Notes (Signed)
[facility] here to transport patient to cath lab at this time.

## 2021-09-21 NOTE — ED Notes (Signed)
William [name] with MRI informed of pt need for MRI before he goes to cath lab

## 2021-09-21 NOTE — ED Notes (Signed)
Patient to MRI at this time. Heparin to be held for procedure due to not being able to take pump in room. Attending MD notified.

## 2021-09-21 NOTE — Progress Notes (Signed)
ANTICOAGULATION CONSULT NOTE Pharmacy Consult for Heparin Indication: chest pain/ACS  Allergies  Allergen Reactions   Codeine Nausea And Vomiting and Other (See Comments)    "deathly sick"   Lipitor [Atorvastatin] Other (See Comments)    Joint pain    Patient Measurements: Height: 5\' 9"  (175.3 cm) Weight: 97.1 kg (214 lb) IBW/kg (Calculated) : 70.7 Heparin Dosing Weight:    Vital Signs: Temp: 98.1 F (36.7 C) (12/01 2001) Temp Source: Oral (12/01 2001) BP: 120/84 (12/01 2142) Pulse Rate: 68 (12/01 2001)  Labs: Recent Labs    09/20/21 1145 09/20/21 1415 09/20/21 2304 09/21/21 0353 09/21/21 0812 09/21/21 2245  HGB 15.6  --   --  15.7  --   --   HCT 47.4  --   --  46.7  --   --   PLT 174  --   --  158  --   --   HEPARINUNFRC  --   --  0.10*  --  0.21* 0.20*  CREATININE 1.09  --   --   --   --   --   TROPONINIHS 18* 125* 812*  --   --   --      Estimated Creatinine Clearance: 61.1 mL/min (by C-G formula based on SCr of 1.09 mg/dL).  Assessment: 80 y.o. male with CAD s/p cath, awaiting poss CABG, for heparin  Goal of Therapy:  Heparin level 0.3-0.7 units/ml Monitor platelets by anticoagulation protocol: Yes   Plan:  Increase Heparin  1650 units/hr Follow-up am labs.  Caryl Pina 09/21/2021,11:21 PM

## 2021-09-21 NOTE — Progress Notes (Signed)
ANTICOAGULATION CONSULT NOTE - Follow Up Consult  Pharmacy Consult for Heparin Indication: chest pain/ACS  Allergies  Allergen Reactions   Codeine Nausea And Vomiting and Other (See Comments)    "deathly sick"   Lipitor [Atorvastatin] Other (See Comments)    Joint pain    Patient Measurements: Height: 5\' 9"  (175.3 cm) Weight: 97.1 kg (214 lb) IBW/kg (Calculated) : 70.7 Heparin Dosing Weight:    Vital Signs: BP: 158/77 (12/01 1425) Pulse Rate: 68 (12/01 1425)  Labs: Recent Labs    09/20/21 1145 09/20/21 1415 09/20/21 2304 09/21/21 0353 09/21/21 0812  HGB 15.6  --   --  15.7  --   HCT 47.4  --   --  46.7  --   PLT 174  --   --  158  --   HEPARINUNFRC  --   --  0.10*  --  0.21*  CREATININE 1.09  --   --   --   --   TROPONINIHS 18* 125* 812*  --   --     Estimated Creatinine Clearance: 61.1 mL/min (by C-G formula based on SCr of 1.09 mg/dL).    Assessment: Anticoag: NSTEMI. Resume heparin post-cath for CABG consult. AM HL 0.21- subtherapeutic  CBC WNL Trop 18>215 > 812  Goal of Therapy:  Heparin level 0.3-0.7 units/ml Monitor platelets by anticoagulation protocol: Yes   Plan:  Resume IV heparin 1450 units/hr 2hrs after sheath out. (sheath out 1341) CABG consult Heparin level 6 hrs after restarted Daily HL and CBC   Brandee Markin S. Alford Highland, PharmD, BCPS Clinical Staff Pharmacist [email or web address]  Alford Highland, Kaaren Nass Stillinger 09/21/2021,2:34 PM

## 2021-09-21 NOTE — Progress Notes (Signed)
ANTICOAGULATION CONSULT NOTE   Pharmacy Consult for Heparin Indication: chest pain/ACS  Allergies  Allergen Reactions   Codeine Nausea And Vomiting and Other (See Comments)    "deathly sick"   Lipitor [Atorvastatin] Other (See Comments)    Joint pain    Patient Measurements: Height: 5\' 9"  (175.3 cm) Weight: 97.1 kg (214 lb) IBW/kg (Calculated) : 70.7 HEPARIN DW (KG): 91   Vital Signs: BP: 146/82 (11/30 2230) Pulse Rate: 65 (11/30 2230)  Labs: Recent Labs    09/20/21 1145 09/20/21 1415 09/20/21 2304  HGB 15.6  --   --   HCT 47.4  --   --   PLT 174  --   --   HEPARINUNFRC  --   --  0.10*  CREATININE 1.09  --   --   TROPONINIHS 18* 125*  --      Estimated Creatinine Clearance: 61.1 mL/min (by C-G formula based on SCr of 1.09 mg/dL).   Medical History: Past Medical History:  Diagnosis Date   BPH (benign prostatic hyperplasia)    CAD (coronary artery disease)    Nonobstructive at cardiac catheterization 2017   Colon polyps    Full dentures    History of colon polyps    History of kidney stones    1970's   HTN (hypertension)    Hyperlipidemia    Lower urinary tract symptoms (LUTS)    Osteoarthritis    Sciatica    Wears glasses     Medications:  See med rec  Assessment: Patient presented to ED with Chest Pain. Troponin is elevated. Patient is not on oral anticoagulants. Pharmacy asked to start heparin  12/1 AM update:  Heparin level low  Goal of Therapy:  Heparin level 0.3-0.7 units/ml Monitor platelets by anticoagulation protocol: Yes   Plan:  Heparin 2000 units re-bolus Inc heparin to 1250 units/hr 8 hour heparin level  Narda Bonds, PharmD, BCPS Clinical Pharmacist Phone: [phone]

## 2021-09-21 NOTE — Progress Notes (Signed)
TCTS consulted for CABG evaluation. °

## 2021-09-21 NOTE — H&P (Addendum)
History and Physical    Felipe Paluch [facility] PQZ:[id number] DOB: 09-Feb-1941 DOA: 09/20/2021  PCP: Lawerance Cruel, MD   Patient coming from: Home  I have personally briefly reviewed patient's old medical records in [place]  Chief Complaint: Chest pain  HPI: William [name] is a 80 y.o. male with medical history significant for coronary artery disease, hypertension. Patient presented to the ED with complaints of chest pain that started this morning at about 9:00 in the morning.  He was having his back when chest pain started.  Pain is located across his chest.  And radiates down his bilateral arms with numbness.  No associated difficulty breathing.  He denies prior chest pain or difficulty breathing with activities. Chest pain lasted about 2 hours till he got to the ED and received nitroglycerin.  He reports 4 days ago, 11/26, he had sudden onset of severe headache at the back of his head, and then he was confused, patient's spouse is at bedside and confirms that patient was able to talk, his speech was not garbled, did not make sense.  This lasted about 45 minutes and self resolved.  No facial asymmetry no weakness of his extremities no abnormal sensations.  He denies prior episodes.  He is not on aspirin.  No history of seizures.  No history of migraine/headache.  ED Course: Stable vitals.  EKG shows sinus rhythm, no significant ST or T wave abnormalities.  Troponin 18 > 125.  Chest X-ray without acute abnormality. Cardiology consulted in the ED, recommended admission to [facility], anticipate cardiac cath, trend troponins, echo, aspirin, continue beta-blocker, statin.  Review of Systems: As per HPI all other systems reviewed and negative.  Past Medical History:  Diagnosis Date   BPH (benign prostatic hyperplasia)    CAD (coronary artery disease)    Nonobstructive at cardiac catheterization 2017   Colon polyps    Full dentures    History of colon polyps    History of kidney  stones    1970's   HTN (hypertension)    Hyperlipidemia    Lower urinary tract symptoms (LUTS)    Osteoarthritis    Sciatica    Wears glasses     Past Surgical History:  Procedure Laterality Date   ANTERIOR CERVICAL DECOMP/DISCECTOMY FUSION  05-24-2009   C3 -- C7   APPENDECTOMY  age 49   CARDIAC CATHETERIZATION N/A 07/06/2016   Procedure: Left Heart Cath and Coronary Angiography;  Surgeon: Jettie Booze, MD;  Location: [place] [facility];  Service: Cardiovascular;  Laterality: N/A;   CARDIOVASCULAR STRESS TEST  06-23-2015  dr Daneen Schick   normal lexiscan study/  no perfusion defects or ischemia/  normal LV function and wall motion , ef 57%   CHOLECYSTECTOMY     LEFT HEART CATHETERIZATION WITH CORONARY ANGIOGRAM N/A 01/01/2013   Procedure: LEFT HEART CATHETERIZATION WITH CORONARY ANGIOGRAM;  Surgeon: Sinclair Grooms, MD;  Location: [facility] CATH LAB;  Service: Cardiovascular;  Laterality: N/A;   Intermediate stenosis in  proximal PDA 50-70%;  dRCA 50%;  ostial and mid CFX 50%;  widely patent LAD;  normal LVF, ef 60%   LUMBAR SPINE SURGERY  1990's   SHOULDER SURGERY Left    TRANSURETHRAL RESECTION OF PROSTATE N/A 07/04/2015   Procedure: TRANSURETHRAL RESECTION OF THE PROSTATE WITH GYRUS INSTRUMENTS;  Surgeon: Franchot Gallo, MD;  Location: [facility];  Service: Urology;  Laterality: N/A;     reports that he quit smoking about 57  years ago. His smoking use included cigarettes. He has never used smokeless tobacco. He reports that he does not drink alcohol and does not use drugs.  Allergies  Allergen Reactions   Codeine Nausea And Vomiting and Other (See Comments)    "deathly sick"   Lipitor [Atorvastatin] Other (See Comments)    Joint pain    Family History  Problem Relation Age of Onset   Dementia Father    Heart attack Other    Colon cancer Neg Hx    Rectal cancer Neg Hx    Stomach cancer Neg Hx    Prior to Admission medications   Medication Sig Start  Date End Date Taking? Authorizing Provider  ezetimibe (ZETIA) 10 MG tablet Take 10 mg by mouth daily.   Yes [provider]  glipiZIDE (GLUCOTROL) 5 MG tablet Take 5 mg by mouth every morning. 08/17/21  Yes [provider]  metoprolol (LOPRESSOR) 50 MG tablet Take 1 tablet (50 mg total) by mouth 2 (two) times daily. 01/01/13  Yes Belva Crome, MD  pravastatin (PRAVACHOL) 20 MG tablet Take 20 mg by mouth once a week.   Yes [provider]  aspirin EC 81 MG tablet Take 81 mg by mouth daily.    [provider]    Physical Exam: Vitals:   09/20/21 2000 09/20/21 2100 09/20/21 2130 09/20/21 2230  BP: (!) 185/88 (!) 145/77 (!) 152/72 (!) 146/82  Pulse: 77 64 65 65  Resp: 16  18   Temp:      TempSrc:      SpO2: 94% 97% 96% 97%  Weight:      Height:        Constitutional: NAD, calm, comfortable Vitals:   09/20/21 2000 09/20/21 2100 09/20/21 2130 09/20/21 2230  BP: (!) 185/88 (!) 145/77 (!) 152/72 (!) 146/82  Pulse: 77 64 65 65  Resp: 16  18   Temp:      TempSrc:      SpO2: 94% 97% 96% 97%  Weight:      Height:       Eyes: PERRL, lids and conjunctivae normal ENMT: Mucous membranes are moist.   Neck: normal, supple, no masses, no thyromegaly Respiratory: clear to auscultation bilaterally, no wheezing, no crackles. Normal respiratory effort. No accessory muscle use.  Cardiovascular: Regular rate and rhythm, no murmurs / rubs / gallops. No extremity edema. 2+ pedal pulses.  Abdomen: no tenderness, no masses palpated. No hepatosplenomegaly. Bowel sounds positive.  Musculoskeletal: no clubbing / cyanosis. No joint deformity upper and lower extremities.  Skin: no rashes, lesions, ulcers. No induration Neurologic: No facial asymmetry, speech fluent, no evidence of aphasia, 4/5 strength in bilateral upper and lower extremities, sensation intact.  Pupils equal round reactive to light. Psychiatric: Normal judgment and insight. Alert and oriented x 3. Normal  mood.   Labs on Admission: I have personally reviewed following labs and imaging studies  CBC: Recent Labs  Lab 09/20/21 1145  WBC 7.8  HGB 15.6  HCT 47.4  MCV 92.6  PLT 664   Basic Metabolic Panel: Recent Labs  Lab 09/20/21 1145  NA 137  K 4.1  CL 106  CO2 26  GLUCOSE 184*  BUN 17  CREATININE 1.09  CALCIUM 8.9    Radiological Exams on Admission: DG Chest Portable 1 View  Result Date: 09/20/2021 CLINICAL DATA:  Chest pain EXAM: PORTABLE CHEST 1 VIEW COMPARISON:  Chest x-ray dated July 05, 2016 FINDINGS: Cardiac and mediastinal contours are unchanged and  within normal limits for AP technique. Clear lungs. No pleural effusion or pneumothorax. IMPRESSION: No active disease. Electronically Signed   By: Yetta Glassman M.D.   On: 09/20/2021 12:12    EKG: Independently reviewed.  Rhythm rate 62, QTc 425.  No significant change from prior.  Assessment/Plan Principal Problem:   Chest pain Active Problems:   NSTEMI (non-ST elevated myocardial infarction) (HCC)   HTN (hypertension)   Diabetes ([place])   NSTEMI with CAD history- chest pain, troponin elevated 18 > 125.  History of nonobstructive CAD.  EKG without ST or T wave changes. -Cardiology consulted, plans to admit to [facility], no beds [facility] at [facility].  Patient will have to wait in the ED to pedis available.   - Heparin drip started continue -N.p.o. midnight -Echocardiogram -Resume aspirin, pravastatin, beta-blocker -Lipid panel in the morning  Diabetes mellitus-  - SSI -Hold home Glipizide - HgbA1c  Hypertension-stable. -Resume home metoprolol  Headache-with confusion.  Transient.  Lasted about 45 minutes.  Denied other focal neurologic deficits, neurologic exam unremarkable today.  No history of seizures or migraines.  On heparin drip. -Obtain brain MRI.  DVT prophylaxis: Lovenox Code Status: Full code Family Communication: Spouse at bedside Disposition Plan: ~ 1- 2 days Consults called:  Cards Admission status: Inpt tele   Bethena Roys MD [facility]  09/21/2021, 12:28 AM

## 2021-09-21 NOTE — Progress Notes (Signed)
ANTICOAGULATION CONSULT NOTE   Pharmacy Consult for Heparin Indication: chest pain/ACS  Allergies  Allergen Reactions   Codeine Nausea And Vomiting and Other (See Comments)    "deathly sick"   Lipitor [Atorvastatin] Other (See Comments)    Joint pain    Patient Measurements: Height: 5\' 9"  (175.3 cm) Weight: 97.1 kg (214 lb) IBW/kg (Calculated) : 70.7 HEPARIN DW (KG): 91   Vital Signs: BP: 163/90 (12/01 0800) Pulse Rate: 69 (12/01 0800)  Labs: Recent Labs    09/20/21 1145 09/20/21 1415 09/20/21 2304 09/21/21 0353 09/21/21 0812  HGB 15.6  --   --  15.7  --   HCT 47.4  --   --  46.7  --   PLT 174  --   --  158  --   HEPARINUNFRC  --   --  0.10*  --  0.21*  CREATININE 1.09  --   --   --   --   TROPONINIHS 18* 125* 812*  --   --      Estimated Creatinine Clearance: 61.1 mL/min (by C-G formula based on SCr of 1.09 mg/dL).   Medical History: Past Medical History:  Diagnosis Date   BPH (benign prostatic hyperplasia)    CAD (coronary artery disease)    Nonobstructive at cardiac catheterization 2017   Colon polyps    Full dentures    History of colon polyps    History of kidney stones    1970's   HTN (hypertension)    Hyperlipidemia    Lower urinary tract symptoms (LUTS)    Osteoarthritis    Sciatica    Wears glasses     Medications:  See med rec  Assessment: Patient presented to ED with Chest Pain. Troponin is elevated. Patient is not on oral anticoagulants. Pharmacy asked to start heparin  HL 0.21- subtherapeutic  CBC WNL Trop 125 > 812   Goal of Therapy:  Heparin level 0.3-0.7 units/ml Monitor platelets by anticoagulation protocol: Yes   Plan:  Heparin 1500 units re-bolus Increase heparin infusion to 1450 units/hr 8 hour heparin level  Margot Ables, PharmD Clinical Pharmacist 09/21/2021 8:52 AM

## 2021-09-21 NOTE — Progress Notes (Incomplete)
Received patient  from [facility] via [facility], alert and oriented X4, Skin warm and dry resp even and unlabored, denies any CP at this time.  Pt on bedside monitor, IVF infusing in left hand of Heparin and NS with SL in left AC.  Consent signed and patient waiting for cath procedure.

## 2021-09-21 NOTE — Progress Notes (Addendum)
PROGRESS NOTE  William [name] ZOX:[id number] DOB: Dec 19, 1940 DOA: 09/20/2021 PCP: Lawerance Cruel, MD  Brief History:  80 year old male with a history of nonobstructive coronary artery disease, hypertension, hyperlipidemia presenting with chest pressure in the morning of 09/20/2021 when he was showering and brushing his teeth.  He stated that it radiated to his bilateral arms.  That lasted about 2 hours until he got to the emergency department at which time he was given nitroglycerin with relief of the symptoms.  He denies any shortness of breath, cough, hemoptysis, fevers, chills.  On 09/16/2021, the patient complained of an occipital headache.  He has some associated confusion and what he described as some word finding difficulties.  He was driving at that time.  There is no focal extremity weakness, dysarthria, facial droop, or visual disturbance.  The entire episode lasted 45 minutes.  It has not recurred.  The patient has not described any exertional dyspnea or chest tightness.  In the emergency department, the patient was afebrile and hemodynamically stable.  Troponins trended up from 18>> 125>> 812.  He was seen by cardiology who recommended continuing IV heparin and transferred to [facility].  EKG showed sinus rhythm with nonspecific T wave changes.  Chest x-ray was negative.  Assessment/Plan: Unstable angina with elevated troponin -Appreciate cardiology consult -Continue IV heparin -Continue aspirin, metoprolol -Echocardiogram -LDL 79  Transient confusion/headache -Concerns for complicated migraine versus TIA -Obtain EEG -MR brain  Statin intolerance -Continue Zetia  Diabetes mellitus type 2 -Holding home glipizide -NovoLog sliding scale -Follow-up hemoglobin A1c      Status is: Observation  The patient will require care spanning > 2 midnights and should be moved to inpatient because: severity of illness       Family Communication:  no Family at  bedside  Consultants:  cardiology  Code Status:  FULL   DVT Prophylaxis: IV Heparin    Procedures: As Listed in Progress Note Above  Antibiotics: None   Total time spent 35 minutes.  Greater than 50% spent face to face counseling and coordinating care.      Subjective: Patient denies fevers, chills, headache, chest pain, dyspnea, nausea, vomiting, diarrhea, abdominal pain, dysuria, hematuria, hematochezia, and melena.   Objective: Vitals:   09/21/21 0300 09/21/21 0330 09/21/21 0430 09/21/21 0500  BP: 129/69 134/61 140/69 (!) 157/91  Pulse: 67 64 64 67  Resp: 14 16 15 14   Temp:      TempSrc:      SpO2: 96% 93% 94% 97%  Weight:      Height:        Intake/Output Summary (Last 24 hours) at 09/21/2021 0708 Last data filed at 09/20/2021 2200 Gross per 24 hour  Intake --  Output 500 ml  Net -500 ml   Weight change:  Exam:  General:  Pt is alert, follows commands appropriately, not in acute distress HEENT: No icterus, No thrush, No neck mass, [place]/AT Cardiovascular: RRR, S1/S2, no rubs, no gallops Respiratory: CTA bilaterally, no wheezing, no crackles, no rhonchi Abdomen: Soft/+BS, non tender, non distended, no guarding Extremities: No edema, No lymphangitis, No petechiae, No rashes, no synovitis Neuro:  CN II-XII intact, strength 4/5 in RUE, RLE, strength 4/5 LUE, LLE; sensation intact bilateral; no dysmetria; babinski equivocal    Data Reviewed: I have personally reviewed following labs and imaging studies Basic Metabolic Panel: Recent Labs  Lab 09/20/21 1145  NA 137  K 4.1  CL 106  CO2 26  GLUCOSE 184*  BUN 17  CREATININE 1.09  CALCIUM 8.9   Liver Function Tests: No results for input(s): AST, ALT, ALKPHOS, BILITOT, PROT, ALBUMIN in the last 168 hours. No results for input(s): LIPASE, AMYLASE in the last 168 hours. No results for input(s): AMMONIA in the last 168 hours. Coagulation Profile: No results for input(s): INR, PROTIME in the last 168  hours. CBC: Recent Labs  Lab 09/20/21 1145 09/21/21 0353  WBC 7.8 7.9  HGB 15.6 15.7  HCT 47.4 46.7  MCV 92.6 92.3  PLT 174 158   Cardiac Enzymes: No results for input(s): CKTOTAL, CKMB, CKMBINDEX, TROPONINI in the last 168 hours. BNP: Invalid input(s): POCBNP CBG: No results for input(s): GLUCAP in the last 168 hours. HbA1C: No results for input(s): HGBA1C in the last 72 hours. Urine analysis:    Component Value Date/Time   COLORURINE AMBER (A) 07/18/2016 2034   APPEARANCEUR CLOUDY (A) 07/18/2016 2034   LABSPEC 1.031 (H) 07/18/2016 2034   PHURINE 5.0 07/18/2016 2034   GLUCOSEU NEGATIVE 07/18/2016 2034   HGBUR SMALL (A) 07/18/2016 2034   BILIRUBINUR SMALL (A) 07/18/2016 2034   [place] NEGATIVE 07/18/2016 2034   PROTEINUR 30 (A) 07/18/2016 2034   NITRITE NEGATIVE 07/18/2016 2034   LEUKOCYTESUR NEGATIVE 07/18/2016 2034   Sepsis Labs: @LABRCNTIP (procalcitonin:4,lacticidven:4) ) Recent Results (from the past 240 hour(s))  Resp Panel by RT-PCR (Flu A&B, Covid) Nasopharyngeal Swab     Status: None   Collection Time: 09/20/21  6:34 PM   Specimen: Nasopharyngeal Swab; Nasopharyngeal(NP) swabs in vial transport medium  Result Value Ref Range Status   SARS Coronavirus 2 by RT PCR NEGATIVE NEGATIVE Final    Comment: (NOTE) SARS-CoV-2 target nucleic acids are NOT DETECTED.  The SARS-CoV-2 RNA is generally detectable in upper respiratory specimens during the acute phase of infection. The lowest concentration of SARS-CoV-2 viral copies this assay can detect is 138 copies/mL. A negative result does not preclude SARS-Cov-2 infection and should not be used as the sole basis for treatment or other patient management decisions. A negative result may occur with  improper specimen collection/handling, submission of specimen other than nasopharyngeal swab, presence of viral mutation(s) within the areas targeted by this assay, and inadequate number of viral copies(<138 copies/mL).  A negative result must be combined with clinical observations, patient history, and epidemiological information. The expected result is Negative.  Fact Sheet for Patients:  EntrepreneurPulse.com.au  Fact Sheet for Healthcare Providers:  IncredibleEmployment.be  This test is no t yet approved or cleared by the [place] FDA and  has been authorized for detection and/or diagnosis of SARS-CoV-2 by FDA under an Emergency Use Authorization (EUA). This EUA will remain  in effect (meaning this test can be used) for the duration of the COVID-19 declaration under Section 564(b)(1) of the Act, 21 U.S.C.section 360bbb-3(b)(1), unless the authorization is terminated  or revoked sooner.       Influenza A by PCR NEGATIVE NEGATIVE Final   Influenza B by PCR NEGATIVE NEGATIVE Final    Comment: (NOTE) The Xpert Xpress SARS-CoV-2/FLU/RSV plus assay is intended as an aid in the diagnosis of influenza from Nasopharyngeal swab specimens and should not be used as a sole basis for treatment. Nasal washings and aspirates are unacceptable for Xpert Xpress SARS-CoV-2/FLU/RSV testing.  Fact Sheet for Patients: EntrepreneurPulse.com.au  Fact Sheet for Healthcare Providers: IncredibleEmployment.be  This test is not yet approved or cleared by the [place] FDA and has been authorized for detection and/or diagnosis of  SARS-CoV-2 by FDA under an Emergency Use Authorization (EUA). This EUA will remain in effect (meaning this test can be used) for the duration of the COVID-19 declaration under Section 564(b)(1) of the Act, 21 U.S.C. section 360bbb-3(b)(1), unless the authorization is terminated or revoked.  Performed at [facility], [address] [place], [place] [address]      Scheduled Meds:  aspirin  81 mg Oral Pre-Cath   aspirin EC  81 mg Oral Daily   ezetimibe  10 mg Oral Daily   insulin aspart  0-9 Units  Subcutaneous Q6H   [START ON 09/22/2021] LORazepam  0.5 mg Intravenous Once   metoprolol tartrate  50 mg Oral BID   pravastatin  20 mg Oral Weekly   Continuous Infusions:  sodium chloride     sodium chloride     sodium chloride     heparin 1,250 Units/hr (09/21/21 0038)    Procedures/Studies: DG Chest Portable 1 View  Result Date: 09/20/2021 CLINICAL DATA:  Chest pain EXAM: PORTABLE CHEST 1 VIEW COMPARISON:  Chest x-ray dated July 05, 2016 FINDINGS: Cardiac and mediastinal contours are unchanged and within normal limits for AP technique. Clear lungs. No pleural effusion or pneumothorax. IMPRESSION: No active disease. Electronically Signed   By: Yetta Glassman M.D.   On: 09/20/2021 12:12    Orson Eva, DO  [facility]  If 7PM-7AM, please contact night-coverage [email or web address] Password [email or web address] 09/21/2021, 7:08 AM   LOS: 0 days

## 2021-09-21 NOTE — ED Notes (Signed)
Date and time results received: 09/21/21 0125 (use smartphrase ".now" to insert current time)  Test: troponin Critical Value: 812  Name of Provider Notified: Dr. Denton Brick  Orders Received? Or Actions Taken?: no/na

## 2021-09-21 NOTE — Progress Notes (Addendum)
Progress Note  Patient Name: William [name] Date of Encounter: 09/21/2021  [facility] Cardiologist: Sinclair Grooms, MD   Subjective   Did not sleep well overnight. Denies any chest pain or palpitations. Breathing at baseline. He has been NPO since midnight.   Inpatient Medications    Scheduled Meds:  aspirin EC  81 mg Oral Daily   ezetimibe  10 mg Oral Daily   insulin aspart  0-9 Units Subcutaneous Q6H   [START ON 09/22/2021] LORazepam  0.5 mg Intravenous Once   metoprolol tartrate  50 mg Oral BID   pravastatin  20 mg Oral Daily   Continuous Infusions:  sodium chloride     sodium chloride     sodium chloride     heparin 1,250 Units/hr (09/21/21 0038)   PRN Meds: acetaminophen **OR** acetaminophen, ondansetron **OR** ondansetron (ZOFRAN) IV, polyethylene glycol   Vital Signs    Vitals:   09/21/21 0300 09/21/21 0330 09/21/21 0430 09/21/21 0500  BP: 129/69 134/61 140/69 (!) 157/91  Pulse: 67 64 64 67  Resp: 14 16 15 14   Temp:      TempSrc:      SpO2: 96% 93% 94% 97%  Weight:      Height:        Intake/Output Summary (Last 24 hours) at 09/21/2021 0755 Last data filed at 09/20/2021 2200 Gross per 24 hour  Intake --  Output 500 ml  Net -500 ml   Last 3 Weights 09/20/2021 03/03/2020 02/15/2020  Weight (lbs) 214 lb 219 lb 219 lb 12.8 oz  Weight (kg) 97.07 kg 99.338 kg 99.701 kg      Telemetry    NSR, HR in 60's to 70's with occasional PVC's.  - Personally Reviewed  ECG    NSR, HR 62 with no acute ST changes.  - Personally Reviewed  Physical Exam   GEN: Pleasant male appearing in no acute distress.   Neck: No JVD Cardiac: RRR with occasional ectopic beats, no murmurs, rubs, or gallops.  Respiratory: Clear to auscultation bilaterally. GI: Soft, nontender, non-distended  MS: No pitting edema; No deformity. Neuro:  Nonfocal  Psych: Normal affect   Labs    High Sensitivity Troponin:   Recent Labs  Lab 09/20/21 1145 09/20/21 1415  09/20/21 2304  TROPONINIHS 18* 125* 812*     Chemistry Recent Labs  Lab 09/20/21 1145  NA 137  K 4.1  CL 106  CO2 26  GLUCOSE 184*  BUN 17  CREATININE 1.09  CALCIUM 8.9  GFRNONAA >60  ANIONGAP 5    Lipids  Recent Labs  Lab 09/21/21 0353  CHOL 139  TRIG 142  HDL 32*  LDLCALC 79  CHOLHDL 4.3    Hematology Recent Labs  Lab 09/20/21 1145 09/21/21 0353  WBC 7.8 7.9  RBC 5.12 5.06  HGB 15.6 15.7  HCT 47.4 46.7  MCV 92.6 92.3  MCH 30.5 31.0  MCHC 32.9 33.6  RDW 12.6 12.8  PLT 174 158    Radiology    DG Chest Portable 1 View  Result Date: 09/20/2021 CLINICAL DATA:  Chest pain EXAM: PORTABLE CHEST 1 VIEW COMPARISON:  Chest x-ray dated July 05, 2016 FINDINGS: Cardiac and mediastinal contours are unchanged and within normal limits for AP technique. Clear lungs. No pleural effusion or pneumothorax. IMPRESSION: No active disease. Electronically Signed   By: Yetta Glassman M.D.   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 This  lesion appears improved compared to the prior study. Nonobstructive coronary artery disease. The left ventricular ejection fraction is 55-65% by visual estimate. The left ventricular systolic function is normal. There is no aortic valve stenosis.   Continue aggressive secondary prevention.  Patient Profile     80 y.o. male w/ PMH of CAD (nonobstructive CAD by cath in 2017), HTN and HLD who presented to [facility] ED on 09/20/2021 for evaluation of chest pain. Found to have an NSTEMI.   Assessment & Plan    1. NSTEMI - Presented with chest pain which radiated into his arms and subscapular region and started the day of admission.  - He has been found to have an NSTEMI with Hs Troponin values trending up from 18 to 125 and at 812 on most recent check. Echo pending. Scheduled for a cardiac catheterization later today once his MRI has been performed to make sure no  contraindications to DAPT in case he requires intervention. The patient understands that risks include but are not limited to stroke (1 in 1000), death (1 in 18), kidney failure [usually temporary] (1 in 500), bleeding (1 in 200), allergic reaction [possibly serious] (1 in 200).   - Continue IV Heparin, ASA 81mg  daily, Zetia 10mg  daily, Pravastatin 20mg  daily and Lopressor 50mg  BID.   2. Headache/Episode of AMS - Patient's wife reported an episode of him having confusion this past Saturday and he also had a headache at that time. No residual neurological deficits. Brain MRI pending.   3. HTN - BP is elevated at 157/91 today but he has not yet received his AM medications. Continue Lopressor 50mg  BID.   4. HLD - FLP shows total cholesterol of 139, HDL 32 and LDL 79. Previously intolerant to Atorvastatin. He was taking Pravastatin 20mg  once weekly and Zetia 10mg  daily prior to admission. Will titrate Pravastatin to daily dosing. Could also re-challenge with Crestor if LDL remains above goal.   For questions or updates, please contact [place] Please consult [email or web address] for contact info under        Signed, Erma Heritage, PA-C  09/21/2021, 7:55 AM     Attending note:  Patient remains in ER awaiting transfer to [name] for diagnostic cardiac catheterization.  I agree with above assessment by Ms. Strader PA-C.  He remains chest pain-free.  High-sensitivity troponin I increased to 812 consistent with NSTEMI.  He is n.p.o. at this time.  Hemodynamically stable although with increased blood pressure this morning.  Subsequent lab work shows LDL 79, hemoglobin 15.7, platelets 158.  COVID-19 and influenza A negative.  Brain MRI is normal.  Continue aspirin, Pravachol, Zetia, Lopressor, and IV heparin.  Keep NPO.  Satira Sark, M.D., F.A.C.C.

## 2021-09-22 ENCOUNTER — Inpatient Hospital Stay (HOSPITAL_COMMUNITY): Payer: Medicare Other

## 2021-09-22 DIAGNOSIS — Z01810 Encounter for preprocedural cardiovascular examination: Secondary | ICD-10-CM

## 2021-09-22 DIAGNOSIS — I2510 Atherosclerotic heart disease of native coronary artery without angina pectoris: Secondary | ICD-10-CM

## 2021-09-22 LAB — ECHOCARDIOGRAM COMPLETE
AR max vel: 4.3 cm2
AV Area VTI: 3.63 cm2
AV Area mean vel: 3.8 cm2
AV Mean grad: 3 mmHg
AV Peak grad: 5.1 mmHg
Ao pk vel: 1.13 m/s
Area-P 1/2: 2.99 cm2
Height: 69 in
S' Lateral: 2.7 cm
Weight: 3424 oz

## 2021-09-22 LAB — GLUCOSE, CAPILLARY
Glucose-Capillary: 120 mg/dL — ABNORMAL HIGH (ref 70–99)
Glucose-Capillary: 131 mg/dL — ABNORMAL HIGH (ref 70–99)
Glucose-Capillary: 148 mg/dL — ABNORMAL HIGH (ref 70–99)
Glucose-Capillary: 163 mg/dL — ABNORMAL HIGH (ref 70–99)

## 2021-09-22 LAB — HEPARIN LEVEL (UNFRACTIONATED): Heparin Unfractionated: 0.43 IU/mL (ref 0.30–0.70)

## 2021-09-22 LAB — CBC
HCT: 46.7 % (ref 39.0–52.0)
Hemoglobin: 15.5 g/dL (ref 13.0–17.0)
MCH: 30.1 pg (ref 26.0–34.0)
MCHC: 33.2 g/dL (ref 30.0–36.0)
MCV: 90.7 fL (ref 80.0–100.0)
Platelets: 166 10*3/uL (ref 150–400)
RBC: 5.15 MIL/uL (ref 4.22–5.81)
RDW: 12.9 % (ref 11.5–15.5)
WBC: 7.1 10*3/uL (ref 4.0–10.5)
nRBC: 0 % (ref 0.0–0.2)

## 2021-09-22 LAB — BASIC METABOLIC PANEL
Anion gap: 5 (ref 5–15)
BUN: 12 mg/dL (ref 8–23)
CO2: 26 mmol/L (ref 22–32)
Calcium: 9.2 mg/dL (ref 8.9–10.3)
Chloride: 106 mmol/L (ref 98–111)
Creatinine, Ser: 1.01 mg/dL (ref 0.61–1.24)
GFR, Estimated: >60 mL/min (ref >60)
Glucose, Bld: 143 mg/dL — ABNORMAL HIGH (ref 70–99)
Potassium: 4.2 mmol/L (ref 3.5–5.1)
Sodium: 137 mmol/L (ref 135–145)

## 2021-09-22 MED ORDER — ROSUVASTATIN CALCIUM 5 MG PO TABS
10.0000 mg | ORAL_TABLET | Freq: Every day | ORAL | Status: DC
Start: 2021-09-22 — End: 2021-09-26
  Administered 2021-09-22 – 2021-09-25 (×4): 10 mg via ORAL
  Filled 2021-09-22 (×4): qty 2

## 2021-09-22 MED ORDER — METOPROLOL TARTRATE 100 MG PO TABS
100.0000 mg | ORAL_TABLET | Freq: Two times a day (BID) | ORAL | Status: DC
  Administered 2021-09-22 – 2021-09-25 (×7): 100 mg via ORAL
  Filled 2021-09-22 (×7): qty 1

## 2021-09-22 MED ORDER — NITROGLYCERIN 0.4 MG SL SUBL
SUBLINGUAL_TABLET | SUBLINGUAL | Status: AC
  Administered 2021-09-22: 0.4 mg
  Filled 2021-09-22: qty 1

## 2021-09-22 MED ORDER — HYDRALAZINE HCL 50 MG PO TABS
50.0000 mg | ORAL_TABLET | Freq: Three times a day (TID) | ORAL | Status: DC
  Administered 2021-09-22 – 2021-09-25 (×9): 50 mg via ORAL
  Filled 2021-09-22 (×9): qty 1

## 2021-09-22 MED ORDER — NITROGLYCERIN 0.4 MG SL SUBL
0.4000 mg | SUBLINGUAL_TABLET | SUBLINGUAL | Status: DC | PRN
  Administered 2021-09-22: 0.4 mg via SUBLINGUAL

## 2021-09-22 MED FILL — Nitroglycerin IV Soln 100 MCG/ML in D5W: INTRA_ARTERIAL | Qty: 10 | Status: AC

## 2021-09-22 NOTE — Progress Notes (Signed)
  Echocardiogram 2D Echocardiogram has been performed.  Merrie Roof F 09/22/2021, 2:55 PM

## 2021-09-22 NOTE — Plan of Care (Signed)
  Problem: Education: Goal: Knowledge of General Education information will improve Description: Including pain rating scale, medication(s)/side effects and non-pharmacologic comfort measures Outcome: Progressing   Problem: Health Behavior/Discharge Planning: Goal: Ability to manage health-related needs will improve Outcome: Progressing   Problem: Clinical Measurements: Goal: Ability to maintain clinical measurements within normal limits will improve Outcome: Progressing   Problem: Clinical Measurements: Goal: Cardiovascular complication will be avoided Outcome: Progressing   Problem: Safety: Goal: Ability to remain free from injury will improve Outcome: Progressing   Problem: Skin Integrity: Goal: Risk for impaired skin integrity will decrease Outcome: Progressing   Problem: Cardiovascular: Goal: Ability to achieve and maintain adequate cardiovascular perfusion will improve Outcome: Progressing

## 2021-09-22 NOTE — Progress Notes (Signed)
Discussed with cardiologist-Dr. Branch-hospitalist service will sign off.

## 2021-09-22 NOTE — Progress Notes (Signed)
Progress Note  Patient Name: William [name] [facility] Date of Encounter: 09/22/2021  [facility] Cardiologist: Sinclair Grooms, MD   Subjective   Mr [facility] feels well this AM. His wife is planning to come in. He does want to proceed with surgery. He has some tightness in his chest, encouraged to ask for SL Nitro when needed.    HDS. Hypertensive HR > 60 Normal renal fxn  LHC 09/22/2021   2nd Mrg lesion is 75% stenosed.   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   Mid LM lesion is 90% stenosed.   The left ventricular systolic function is normal.   LV end diastolic pressure is mildly elevated.   The left ventricular ejection fraction is 50-55% by visual estimate.  Inpatient Medications    Scheduled Meds:  aspirin  81 mg Oral Daily   ezetimibe  10 mg Oral Daily   insulin aspart  0-9 Units Subcutaneous Q6H   LORazepam  0.5 mg Intravenous Once   metoprolol tartrate  50 mg Oral BID   pravastatin  20 mg Oral q1800   sodium chloride flush  3 mL Intravenous Q12H   Continuous Infusions:  sodium chloride     heparin 1,650 Units/hr (09/22/21 0600)   PRN Meds: sodium chloride, acetaminophen, ondansetron (ZOFRAN) IV, ondansetron **OR** [DISCONTINUED] ondansetron (ZOFRAN) IV, polyethylene glycol, sodium chloride flush   Vital Signs    Vitals:   09/21/21 2142 09/22/21 0001 09/22/21 0441 09/22/21 0903  BP: 120/84 129/76 122/72 (!) 161/84  Pulse:  67 70   Resp:   18   Temp:  97.6 F (36.4 C) 98 F (36.7 C)   TempSrc:  Oral Oral   SpO2:  98% 97%   Weight:      Height:        Intake/Output Summary (Last 24 hours) at 09/22/2021 0907 Last data filed at 09/22/2021 0600 Gross per 24 hour  Intake 807.18 ml  Output 790 ml  Net 17.18 ml   Last 3 Weights 09/20/2021 03/03/2020 02/15/2020  Weight (lbs) 214 lb 219 lb 219 lb 12.8 oz  Weight (kg) 97.07 kg 99.338 kg 99.701 kg      Telemetry    NSR - Personally Reviewed  ECG    NA - Personally Reviewed  Physical Exam   Vitals:   09/22/21  0441 09/22/21 0903  BP: 122/72 (!) 161/84  Pulse: 70   Resp: 18   Temp: 98 F (36.7 C)   SpO2: 97%     GEN: No acute distress.  Lying flat, Neck: No JVD HEENT: moist MM Cardiac: RRR, no murmurs, rubs, or gallops.  Respiratory: Clear to auscultation bilaterally. GI: Soft, nontender, non-distended  MS: No edema; No deformity. Neuro:  Nonfocal  Psych: Normal affect   Labs    High Sensitivity Troponin:   Recent Labs  Lab 09/20/21 1145 09/20/21 1415 09/20/21 2304  TROPONINIHS 18* 125* 812*     Chemistry Recent Labs  Lab 09/20/21 1145 09/22/21 0756  NA 137 137  K 4.1 4.2  CL 106 106  CO2 26 26  GLUCOSE 184* 143*  BUN 17 12  CREATININE 1.09 1.01  CALCIUM 8.9 9.2  GFRNONAA >60 >60  ANIONGAP 5 5    Lipids  Recent Labs  Lab 09/21/21 0353  CHOL 139  TRIG 142  HDL 32*  LDLCALC 79  CHOLHDL 4.3    Hematology Recent Labs  Lab 09/20/21 1145 09/21/21 0353 09/22/21 0756  WBC 7.8 7.9 7.1  RBC 5.12 5.06  5.15  HGB 15.6 15.7 15.5  HCT 47.4 46.7 46.7  MCV 92.6 92.3 90.7  MCH 30.5 31.0 30.1  MCHC 32.9 33.6 33.2  RDW 12.6 12.8 12.9  PLT 174 158 166   Thyroid No results for input(s): TSH, FREET4 in the last 168 hours.  BNPNo results for input(s): BNP, PROBNP in the last 168 hours.  DDimer No results for input(s): DDIMER in the last 168 hours.   Radiology    MR BRAIN WO CONTRAST  Result Date: 09/21/2021 CLINICAL DATA:  Acute occipital headache and confusion lasting 45 minutes, 4 days ago, bilateral weakness for 2 days EXAM: MRI HEAD WITHOUT CONTRAST TECHNIQUE: Multiplanar, multiecho pulse sequences of the brain and surrounding structures were obtained without intravenous contrast. COMPARISON:  MR head 12/16/2003 FINDINGS: Brain: There is no evidence of acute intracranial hemorrhage, extra-axial fluid collection, or acute infarct. Parenchymal volume is within normal limits for age. The ventricles are normal in size. There is no parenchymal signal abnormality, with no  significant burden of white matter microangiopathic change. There is no solid mass lesion.  There is no midline shift. Vascular: Normal flow voids. Skull and upper cervical spine: Normal marrow signal. Upper cervical spine fusion hardware is noted. Sinuses/Orbits: The imaged paranasal sinuses are clear. Bilateral lens implants are in place. The globes and orbits are otherwise unremarkable. Other: None. IMPRESSION: Normal brain MRI. Electronically Signed   By: Valetta Mole M.D.   On: 09/21/2021 08:58   CARDIAC CATHETERIZATION  Result Date: 09/21/2021 Images from the original result were not included.   2nd Mrg lesion is 75% stenosed.   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   Mid LM lesion is 90% stenosed.   The left ventricular systolic function is normal.   LV end diastolic pressure is mildly elevated.   The left ventricular ejection fraction is 50-55% by visual estimate. [facility] is a 80 y.o. male  [id number] LOCATION:  FACILITY: [place] PHYSICIAN: Quay Burow, M.D. 02-19-1941 DATE OF PROCEDURE:  09/21/2021 DATE OF DISCHARGE: CARDIAC CATHETERIZATION History obtained from chart review.80 y.o. male w/ PMH of CAD (nonobstructive CAD by cath in 2017), HTN and HLD who presented to [facility] ED on 09/20/2021 for evaluation of chest pain. Found to have an NSTEMI.   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  He was presented with chest pain and had a "non-STEMI".  He said no further pain since hospitalization and IV heparin.  He will need CABG for complete revascularization.  The sheath was removed and a TR band was placed on the right wrist to achieve patent hemostasis.  The patient left lab in stable condition.  Heparin will be restarted in 2 hours without a bolus. Quay Burow. MD, [facility] 09/21/2021 1:57 PM    DG Chest Portable 1 View  Result Date: 09/20/2021 CLINICAL DATA:  Chest pain EXAM: PORTABLE CHEST 1 VIEW COMPARISON:  Chest x-ray dated July 05, 2016 FINDINGS: Cardiac and mediastinal contours  are unchanged and within normal limits for AP technique. Clear lungs. No pleural effusion or pneumothorax. IMPRESSION: No active disease. Electronically Signed   By: Yetta Glassman M.D.   On: 09/20/2021 12:12    Cardiac Studies   Pending TTE  LHC per above  Patient Profile     80 y.o. male w/ PMH of CAD (nonobstructive CAD by cath in 2017), HTN and HLD who presented to [facility] ED on 09/20/2021 for evaluation of chest pain. Found to have an NSTEMI, here with LM dx pending evaluation for  possible CABG    Assessment & Plan    #LM disease: he presented with NSTEMI from [facility]. He had LHC showing significant LM disease. CT surgery discussed bypass and Mr. Fesler would like to proceed with w/u - pending plan for possible CABG - echo pending - cont asa 81 mg daily - cont heparin gtt -increase metop 50 mg BID to 100 mg BID with hypertension - added hydralazine 50 mg TID, will plan to transition to ARB post - op  - continue pravastatin and zetia (LDL at goal 79) - A1c 6.8%  For questions or updates, please contact [facility] Please consult [email or web address] for contact info under        Signed, Janina Mayo, MD  09/22/2021, 9:07 AM

## 2021-09-22 NOTE — Progress Notes (Signed)
Discussed with pt and wife IS (1800 ml), sternal precautions, mobility post op and d/c planning. Pt receptive. He has been going to bathroom and encouraged him to practice sternal precautions. Gave materials to review. No ambulation due to severe LM and resting CP at times. Wife can be with him at d/c. [place] CES, ACSM 3:13 PM 09/22/2021

## 2021-09-22 NOTE — Progress Notes (Signed)
ANTICOAGULATION CONSULT NOTE Pharmacy Consult for Heparin Indication: chest pain/ACS  Allergies  Allergen Reactions   Codeine Nausea And Vomiting and Other (See Comments)    "deathly sick"   Lipitor [Atorvastatin] Other (See Comments)    Joint pain    Patient Measurements: Height: 5\' 9"  (175.3 cm) Weight: 97.1 kg (214 lb) IBW/kg (Calculated) : 70.7 Heparin Dosing Weight:    Vital Signs: Temp: 98 F (36.7 C) (12/02 0441) Temp Source: Oral (12/02 0441) BP: 161/84 (12/02 0903) Pulse Rate: 70 (12/02 0441)  Labs: Recent Labs    09/20/21 1145 09/20/21 1415 09/20/21 2304 09/21/21 0353 09/21/21 0812 09/21/21 1732 09/21/21 2245 09/22/21 0756  HGB 15.6  --   --  15.7  --   --   --  15.5  HCT 47.4  --   --  46.7  --   --   --  46.7  PLT 174  --   --  158  --   --   --  166  HEPARINUNFRC  --   --  0.10*  --    < > 0.20* 0.20* 0.43  CREATININE 1.09  --   --   --   --   --   --  1.01  TROPONINIHS 18* 125* 812*  --   --   --   --   --    < > = values in this interval not displayed.     Estimated Creatinine Clearance: 66 mL/min (by C-G formula based on SCr of 1.01 mg/dL).  Assessment: 80 y.o. male with CAD s/p cath, awaiting poss CABG, for heparin.   Heparin level is therapeutic at 0.43, on 1650 units/hr. Hgb 15.5, plt 166. No s/sx of bleeding or infusion issues.   Goal of Therapy:  Heparin level 0.3-0.7 units/ml Monitor platelets by anticoagulation protocol: Yes   Plan:  Continue heparin infusion at 1650 units/hr Monitor daily HL, CBC, and for s/sx of bleeding   Antonietta Jewel, PharmD, [place] Pharmacist  Phone: [phone] 09/22/2021 9:16 AM  Please check AMION for all [place] phone numbers After 10:00 PM, call [place] [phone]

## 2021-09-22 NOTE — Plan of Care (Signed)
  Problem: Education: Goal: Knowledge of General Education information will improve Description: Including pain rating scale, medication(s)/side effects and non-pharmacologic comfort measures Outcome: Progressing   Problem: Health Behavior/Discharge Planning: Goal: Ability to manage health-related needs will improve Outcome: Progressing   Problem: Clinical Measurements: Goal: Ability to maintain clinical measurements within normal limits will improve Outcome: Progressing Goal: Will remain free from infection Outcome: Progressing Goal: Diagnostic test results will improve Outcome: Progressing Goal: Respiratory complications will improve Outcome: Progressing Goal: Cardiovascular complication will be avoided Outcome: Progressing   Problem: Activity: Goal: Risk for activity intolerance will decrease Outcome: Progressing   Problem: Nutrition: Goal: Adequate nutrition will be maintained Outcome: Progressing   Problem: Coping: Goal: Level of anxiety will decrease Outcome: Progressing   Problem: Elimination: Goal: Will not experience complications related to bowel motility Outcome: Progressing Goal: Will not experience complications related to urinary retention Outcome: Progressing   Problem: Pain Managment: Goal: General experience of comfort will improve Outcome: Progressing   Problem: Safety: Goal: Ability to remain free from injury will improve Outcome: Progressing   Problem: Skin Integrity: Goal: Risk for impaired skin integrity will decrease Outcome: Progressing   Problem: Cardiovascular: Goal: Ability to achieve and maintain adequate cardiovascular perfusion will improve Outcome: Progressing Goal: Vascular access site(s) Level 0-1 will be maintained Outcome: Progressing

## 2021-09-22 NOTE — Progress Notes (Signed)
Pre-CABG Dopplers completed. Refer to "CV Proc" under chart review to view preliminary results.  09/22/2021 12:03 PM Kelby Aline., MHA, RVT, RDCS, RDMS

## 2021-09-22 NOTE — Plan of Care (Signed)
Problem: Education: Goal: Knowledge of General Education information will improve Description: Including pain rating scale, medication(s)/side effects and non-pharmacologic comfort measures 09/22/2021 1000 by Drucie Ip I, RN Outcome: Progressing 09/22/2021 0958 by Drucie Ip I, RN Outcome: Progressing 09/22/2021 0957 by Drucie Ip I, RN Outcome: Progressing   Problem: Health Behavior/Discharge Planning: Goal: Ability to manage health-related needs will improve 09/22/2021 1000 by Drucie Ip I, RN Outcome: Progressing 09/22/2021 0958 by Drucie Ip I, RN Outcome: Progressing 09/22/2021 0957 by Drucie Ip I, RN Outcome: Progressing   Problem: Clinical Measurements: Goal: Ability to maintain clinical measurements within normal limits will improve 09/22/2021 1000 by Drucie Ip I, RN Outcome: Progressing 09/22/2021 0958 by Drucie Ip I, RN Outcome: Progressing 09/22/2021 0957 by Drucie Ip I, RN Outcome: Progressing Goal: Will remain free from infection 09/22/2021 1000 by Drucie Ip I, RN Outcome: Progressing 09/22/2021 0958 by Drucie Ip I, RN Outcome: Progressing 09/22/2021 0957 by Drucie Ip I, RN Outcome: Progressing Goal: Diagnostic test results will improve 09/22/2021 1000 by Drucie Ip I, RN Outcome: Progressing 09/22/2021 0958 by Drucie Ip I, RN Outcome: Progressing 09/22/2021 0957 by Drucie Ip I, RN Outcome: Progressing Goal: Respiratory complications will improve 09/22/2021 1000 by Drucie Ip I, RN Outcome: Progressing 09/22/2021 0958 by Drucie Ip I, RN Outcome: Progressing 09/22/2021 0957 by Drucie Ip I, RN Outcome: Progressing Goal: Cardiovascular complication will be avoided 09/22/2021 1000 by Drucie Ip I, RN Outcome: Progressing 09/22/2021 0958 by Drucie Ip I, RN Outcome: Progressing 09/22/2021 0957 by Drucie Ip I,  RN Outcome: Progressing   Problem: Activity: Goal: Risk for activity intolerance will decrease 09/22/2021 1000 by Drucie Ip I, RN Outcome: Progressing 09/22/2021 0958 by Drucie Ip I, RN Outcome: Progressing 09/22/2021 0957 by Drucie Ip I, RN Outcome: Progressing   Problem: Nutrition: Goal: Adequate nutrition will be maintained 09/22/2021 1000 by Drucie Ip I, RN Outcome: Progressing 09/22/2021 0958 by Drucie Ip I, RN Outcome: Progressing 09/22/2021 0957 by Drucie Ip I, RN Outcome: Progressing   Problem: Coping: Goal: Level of anxiety will decrease 09/22/2021 1000 by Drucie Ip I, RN Outcome: Progressing 09/22/2021 0958 by Drucie Ip I, RN Outcome: Progressing 09/22/2021 0957 by Drucie Ip I, RN Outcome: Progressing   Problem: Elimination: Goal: Will not experience complications related to bowel motility 09/22/2021 1000 by Drucie Ip I, RN Outcome: Progressing 09/22/2021 0958 by Drucie Ip I, RN Outcome: Progressing 09/22/2021 0957 by Drucie Ip I, RN Outcome: Progressing Goal: Will not experience complications related to urinary retention 09/22/2021 1000 by Drucie Ip I, RN Outcome: Progressing 09/22/2021 0958 by Drucie Ip I, RN Outcome: Progressing 09/22/2021 0957 by Drucie Ip I, RN Outcome: Progressing   Problem: Pain Managment: Goal: General experience of comfort will improve 09/22/2021 1000 by Drucie Ip I, RN Outcome: Progressing 09/22/2021 0958 by Drucie Ip I, RN Outcome: Progressing 09/22/2021 0957 by Drucie Ip I, RN Outcome: Progressing   Problem: Safety: Goal: Ability to remain free from injury will improve 09/22/2021 1000 by Drucie Ip I, RN Outcome: Progressing 09/22/2021 0958 by Drucie Ip I, RN Outcome: Progressing 09/22/2021 0957 by Drucie Ip I, RN Outcome: Progressing   Problem: Skin Integrity: Goal:  Risk for impaired skin integrity will decrease 09/22/2021 1000 by Drucie Ip I, RN Outcome: Progressing 09/22/2021 0958 by Drucie Ip I, RN Outcome: Progressing 09/22/2021 0957 by Drucie Ip I, RN Outcome: Progressing   Problem: Cardiovascular: Goal: Ability to achieve and maintain adequate cardiovascular perfusion will improve 09/22/2021 1000 by Drucie Ip I, RN Outcome: Progressing 09/22/2021 0958 by Nelma Rothman,  Elisabeth Most, RN Outcome: Progressing 09/22/2021 0957 by Drucie Ip I, RN Outcome: Progressing Goal: Vascular access site(s) Level 0-1 will be maintained 09/22/2021 1000 by Drucie Ip I, RN Outcome: Progressing 09/22/2021 0958 by Drucie Ip I, RN Outcome: Progressing 09/22/2021 0957 by Drucie Ip I, RN Outcome: Progressing

## 2021-09-22 NOTE — Plan of Care (Signed)
  Problem: Education: Goal: Knowledge of General Education information will improve Description: Including pain rating scale, medication(s)/side effects and non-pharmacologic comfort measures 09/22/2021 0958 by Drucie Ip I, RN Outcome: Progressing 09/22/2021 0957 by Drucie Ip I, RN Outcome: Progressing   Problem: Health Behavior/Discharge Planning: Goal: Ability to manage health-related needs will improve 09/22/2021 0958 by Drucie Ip I, RN Outcome: Progressing 09/22/2021 0957 by Drucie Ip I, RN Outcome: Progressing   Problem: Clinical Measurements: Goal: Ability to maintain clinical measurements within normal limits will improve 09/22/2021 0958 by Drucie Ip I, RN Outcome: Progressing 09/22/2021 0957 by Drucie Ip I, RN Outcome: Progressing Goal: Will remain free from infection 09/22/2021 0958 by Drucie Ip I, RN Outcome: Progressing 09/22/2021 0957 by Drucie Ip I, RN Outcome: Progressing Goal: Diagnostic test results will improve 09/22/2021 0958 by Drucie Ip I, RN Outcome: Progressing 09/22/2021 0957 by Drucie Ip I, RN Outcome: Progressing Goal: Respiratory complications will improve 09/22/2021 0958 by Drucie Ip I, RN Outcome: Progressing 09/22/2021 0957 by Drucie Ip I, RN Outcome: Progressing Goal: Cardiovascular complication will be avoided 09/22/2021 0958 by Drucie Ip I, RN Outcome: Progressing 09/22/2021 0957 by Drucie Ip I, RN Outcome: Progressing   Problem: Activity: Goal: Risk for activity intolerance will decrease 09/22/2021 0958 by Drucie Ip I, RN Outcome: Progressing 09/22/2021 0957 by Drucie Ip I, RN Outcome: Progressing   Problem: Nutrition: Goal: Adequate nutrition will be maintained 09/22/2021 0958 by Drucie Ip I, RN Outcome: Progressing 09/22/2021 0957 by Drucie Ip I, RN Outcome: Progressing   Problem: Coping: Goal: Level  of anxiety will decrease 09/22/2021 0958 by Drucie Ip I, RN Outcome: Progressing 09/22/2021 0957 by Drucie Ip I, RN Outcome: Progressing   Problem: Elimination: Goal: Will not experience complications related to bowel motility 09/22/2021 0958 by Drucie Ip I, RN Outcome: Progressing 09/22/2021 0957 by Drucie Ip I, RN Outcome: Progressing Goal: Will not experience complications related to urinary retention 09/22/2021 0958 by Drucie Ip I, RN Outcome: Progressing 09/22/2021 0957 by Drucie Ip I, RN Outcome: Progressing   Problem: Pain Managment: Goal: General experience of comfort will improve 09/22/2021 0958 by Drucie Ip I, RN Outcome: Progressing 09/22/2021 0957 by Drucie Ip I, RN Outcome: Progressing   Problem: Safety: Goal: Ability to remain free from injury will improve 09/22/2021 0958 by Drucie Ip I, RN Outcome: Progressing 09/22/2021 0957 by Drucie Ip I, RN Outcome: Progressing   Problem: Skin Integrity: Goal: Risk for impaired skin integrity will decrease 09/22/2021 0958 by Drucie Ip I, RN Outcome: Progressing 09/22/2021 0957 by Drucie Ip I, RN Outcome: Progressing   Problem: Cardiovascular: Goal: Ability to achieve and maintain adequate cardiovascular perfusion will improve 09/22/2021 0958 by Drucie Ip I, RN Outcome: Progressing 09/22/2021 0957 by Drucie Ip I, RN Outcome: Progressing Goal: Vascular access site(s) Level 0-1 will be maintained 09/22/2021 0958 by Drucie Ip I, RN Outcome: Progressing 09/22/2021 0957 by Drucie Ip I, RN Outcome: Progressing

## 2021-09-23 ENCOUNTER — Other Ambulatory Visit: Payer: Self-pay | Admitting: Cardiology

## 2021-09-23 DIAGNOSIS — I2510 Atherosclerotic heart disease of native coronary artery without angina pectoris: Secondary | ICD-10-CM | POA: Diagnosis not present

## 2021-09-23 LAB — CBC
HCT: 43.7 % (ref 39.0–52.0)
Hemoglobin: 14.6 g/dL (ref 13.0–17.0)
MCH: 30.4 pg (ref 26.0–34.0)
MCHC: 33.4 g/dL (ref 30.0–36.0)
MCV: 90.9 fL (ref 80.0–100.0)
Platelets: 156 10*3/uL (ref 150–400)
RBC: 4.81 MIL/uL (ref 4.22–5.81)
RDW: 12.9 % (ref 11.5–15.5)
WBC: 7.1 10*3/uL (ref 4.0–10.5)
nRBC: 0 % (ref 0.0–0.2)

## 2021-09-23 LAB — GLUCOSE, CAPILLARY
Glucose-Capillary: 128 mg/dL — ABNORMAL HIGH (ref 70–99)
Glucose-Capillary: 139 mg/dL — ABNORMAL HIGH (ref 70–99)
Glucose-Capillary: 159 mg/dL — ABNORMAL HIGH (ref 70–99)

## 2021-09-23 LAB — HEPARIN LEVEL (UNFRACTIONATED): Heparin Unfractionated: 0.37 IU/mL (ref 0.30–0.70)

## 2021-09-23 MED ORDER — INSULIN ASPART 100 UNIT/ML IJ SOLN
0.0000 [IU] | Freq: Three times a day (TID) | INTRAMUSCULAR | Status: DC
  Administered 2021-09-24 (×2): 1 [IU] via SUBCUTANEOUS
  Administered 2021-09-25: 2 [IU] via SUBCUTANEOUS
  Administered 2021-09-25: 3 [IU] via SUBCUTANEOUS
  Administered 2021-09-25: 1 [IU] via SUBCUTANEOUS

## 2021-09-23 MED ORDER — LORAZEPAM 0.5 MG PO TABS: 0.5000 mg | ORAL_TABLET | Freq: Two times a day (BID) | ORAL | Status: DC | PRN

## 2021-09-23 NOTE — Progress Notes (Signed)
     [address]       [place] [address]             [phone]       Patient did require sublingual nitroglycerin yesterday, but has had no recurrent chest pain since that point.  Patient is scheduled for CABG 09/25/2021.  William [name] [name]

## 2021-09-23 NOTE — Progress Notes (Signed)
Error

## 2021-09-23 NOTE — Progress Notes (Signed)
Progress Note  Patient Name: William [name] [facility] Date of Encounter: 09/23/2021  [facility] Cardiologist: Sinclair Grooms, MD   Subjective   William [name] is anxious about his surgery. Otherwise he is comfortable. Not reporting active chest pain    RA HDS. Bps better controlled HR > 60 Normal renal fxn Hgb 15  LHC 09/22/2021   2nd Mrg lesion is 75% stenosed.   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   Mid LM lesion is 90% stenosed.   The left ventricular systolic function is normal.   LV end diastolic pressure is mildly elevated.   The left ventricular ejection fraction is 50-55% by visual estimate.  Inpatient Medications    Scheduled Meds:  aspirin  81 mg Oral Daily   ezetimibe  10 mg Oral Daily   hydrALAZINE  50 mg Oral Q8H   insulin aspart  0-9 Units Subcutaneous Q6H   LORazepam  0.5 mg Intravenous Once   metoprolol tartrate  100 mg Oral BID   rosuvastatin  10 mg Oral Daily   sodium chloride flush  3 mL Intravenous Q12H   Continuous Infusions:  sodium chloride     heparin 1,650 Units/hr (09/22/21 1755)   PRN Meds: sodium chloride, acetaminophen, nitroGLYCERIN, ondansetron (ZOFRAN) IV, ondansetron **OR** [DISCONTINUED] ondansetron (ZOFRAN) IV, polyethylene glycol, sodium chloride flush   Vital Signs    Vitals:   09/22/21 1646 09/22/21 1948 09/22/21 2204 09/23/21 0443  BP: 130/66 (!) 109/57 123/75 127/80  Pulse:  91 75 65  Resp:  17  17  Temp:  98.2 F (36.8 C)  98.4 F (36.9 C)  TempSrc:  Oral  Oral  SpO2:  100%  97%  Weight:      Height:        Intake/Output Summary (Last 24 hours) at 09/23/2021 0936 Last data filed at 09/22/2021 1500 Gross per 24 hour  Intake 147.98 ml  Output --  Net 147.98 ml   Last 3 Weights 09/20/2021 03/03/2020 02/15/2020  Weight (lbs) 214 lb 219 lb 219 lb 12.8 oz  Weight (kg) 97.07 kg 99.338 kg 99.701 kg      Telemetry    Normal sinus rhythm - Personally Reviewed  ECG    NA - Personally Reviewed  Physical Exam   Vitals:    09/22/21 2204 09/23/21 0443  BP: 123/75 127/80  Pulse: 75 65  Resp:  17  Temp:  98.4 F (36.9 C)  SpO2:  97%    GEN: No acute distress.  Lying flat,  Neck: No JVD HEENT: moist MM Cardiac: RRR, no murmurs, rubs, or gallops.  Respiratory: Clear to auscultation bilaterally. GI: Soft, nontender, non-distended  MS: No edema; No deformity. Skin: warm and well perfused Neuro:  Nonfocal  Psych: anxious  Labs    High Sensitivity Troponin:   Recent Labs  Lab 09/20/21 1145 09/20/21 1415 09/20/21 2304  TROPONINIHS 18* 125* 812*     Chemistry Recent Labs  Lab 09/20/21 1145 09/22/21 0756  NA 137 137  K 4.1 4.2  CL 106 106  CO2 26 26  GLUCOSE 184* 143*  BUN 17 12  CREATININE 1.09 1.01  CALCIUM 8.9 9.2  GFRNONAA >60 >60  ANIONGAP 5 5    Lipids  Recent Labs  Lab 09/21/21 0353  CHOL 139  TRIG 142  HDL 32*  LDLCALC 79  CHOLHDL 4.3    Hematology Recent Labs  Lab 09/21/21 0353 09/22/21 0756 09/23/21 0211  WBC 7.9 7.1 7.1  RBC 5.06 5.15 4.81  HGB 15.7 15.5 14.6  HCT 46.7 46.7 43.7  MCV 92.3 90.7 90.9  MCH 31.0 30.1 30.4  MCHC 33.6 33.2 33.4  RDW 12.8 12.9 12.9  PLT 158 166 156   Thyroid No results for input(s): TSH, FREET4 in the last 168 hours.  BNPNo results for input(s): BNP, PROBNP in the last 168 hours.  DDimer No results for input(s): DDIMER in the last 168 hours.   Radiology    CARDIAC CATHETERIZATION  Result Date: 09/21/2021 Images from the original result were not included.   2nd Mrg lesion is 75% stenosed.   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   Mid LM lesion is 90% stenosed.   The left ventricular systolic function is normal.   LV end diastolic pressure is mildly elevated.   The left ventricular ejection fraction is 50-55% by visual estimate. William [name] [facility] is a 80 y.o. male  [id number] LOCATION:  FACILITY: [facility] PHYSICIAN: Quay Burow, M.D. 1941-10-05 DATE OF PROCEDURE:  09/21/2021 DATE OF DISCHARGE: CARDIAC CATHETERIZATION History obtained from chart  review.80 y.o. male w/ PMH of CAD (nonobstructive CAD by cath in 2017), HTN and HLD who presented to [facility] ED on 09/20/2021 for evaluation of chest pain. Found to have an NSTEMI.   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  He was presented with chest pain and had a "non-STEMI".  He said no further pain since hospitalization and IV heparin.  He will need CABG for complete revascularization.  The sheath was removed and a TR band was placed on the right wrist to achieve patent hemostasis.  The patient left lab in stable condition.  Heparin will be restarted in 2 hours without a bolus. Quay Burow. MD, [facility] 09/21/2021 1:57 PM    ECHOCARDIOGRAM COMPLETE  Result Date: 09/22/2021    ECHOCARDIOGRAM REPORT   Patient Name:   William [name] [facility] Date of Exam: 09/22/2021 Medical Rec #:  [id number]        Height:       69.0 in Accession #:    [id number]       Weight:       214.0 lb Date of Birth:  17-Oct-1941       BSA:          2.126 m Patient Age:    47 years         BP:           118/76 mmHg Patient Gender: M                HR:           66 bpm. Exam Location:  Inpatient Procedure: 2D Echo, Cardiac Doppler and Color Doppler Indications:    Pre-bypass  History:        Patient has prior history of Echocardiogram examinations, most                 recent 07/07/2016. CAD; Cardiac cath yesterday.  Sonographer:    Merrie Roof RDCS Referring Phys: [id number] [place]  1. Left ventricular ejection fraction, by estimation, is 60 to 65%. The left ventricle has normal function. The left ventricle has no regional wall motion abnormalities. There is mild left ventricular hypertrophy. Left ventricular diastolic parameters are consistent with Grade I diastolic dysfunction (impaired relaxation).  2. Right ventricular systolic function is normal. The right ventricular size is normal. There is normal pulmonary artery systolic pressure. The estimated right ventricular systolic pressure is 73.4 mmHg.  3. The  mitral valve  is normal in structure. Trivial mitral valve regurgitation.  4. The aortic valve was not well visualized. Aortic valve regurgitation is not visualized. Aortic valve sclerosis/calcification is present, without any evidence of aortic stenosis.  5. The inferior vena cava is normal in size with greater than 50% respiratory variability, suggesting right atrial pressure of 3 mmHg. FINDINGS  Left Ventricle: Left ventricular ejection fraction, by estimation, is 60 to 65%. The left ventricle has normal function. The left ventricle has no regional wall motion abnormalities. The left ventricular internal cavity size was normal in size. There is  mild left ventricular hypertrophy. Left ventricular diastolic parameters are consistent with Grade I diastolic dysfunction (impaired relaxation). Right Ventricle: The right ventricular size is normal. No increase in right ventricular wall thickness. Right ventricular systolic function is normal. There is normal pulmonary artery systolic pressure. The tricuspid regurgitant velocity is 2.00 m/s, and  with an assumed right atrial pressure of 3 mmHg, the estimated right ventricular systolic pressure is 46.5 mmHg. Left Atrium: Left atrial size was normal in size. Right Atrium: Right atrial size was normal in size. Pericardium: Trivial pericardial effusion is present. Mitral Valve: The mitral valve is normal in structure. Trivial mitral valve regurgitation. Tricuspid Valve: The tricuspid valve is normal in structure. Tricuspid valve regurgitation is trivial. Aortic Valve: The aortic valve was not well visualized. Aortic valve regurgitation is not visualized. Aortic valve sclerosis/calcification is present, without any evidence of aortic stenosis. Aortic valve mean gradient measures 3.0 mmHg. Aortic valve peak gradient measures 5.1 mmHg. Aortic valve area, by VTI measures 3.63 cm. Pulmonic Valve: The pulmonic valve was not well visualized. Pulmonic valve regurgitation is  trivial. Aorta: The aortic root and ascending aorta are structurally normal, with no evidence of dilitation. Venous: The inferior vena cava is normal in size with greater than 50% respiratory variability, suggesting right atrial pressure of 3 mmHg. IAS/Shunts: The interatrial septum was not well visualized.  LEFT VENTRICLE PLAX 2D LVIDd:         4.10 cm   Diastology LVIDs:         2.70 cm   LV e' medial:    5.33 cm/s LV PW:         1.30 cm   LV E/e' medial:  8.1 LV IVS:        1.30 cm   LV e' lateral:   5.98 cm/s LVOT diam:     2.30 cm   LV E/e' lateral: 7.3 LV SV:         87 LV SV Index:   41 LVOT Area:     4.15 cm  RIGHT VENTRICLE RV Basal diam:  3.80 cm RV S prime:     11.40 cm/s TAPSE (M-mode): 2.4 cm LEFT ATRIUM             Index        RIGHT ATRIUM           Index LA diam:        4.20 cm 1.98 cm/m   RA Area:     15.60 cm LA Vol (A2C):   35.9 ml 16.88 ml/m  RA Volume:   33.50 ml  15.76 ml/m LA Vol (A4C):   45.5 ml 21.40 ml/m LA Biplane Vol: 41.4 ml 19.47 ml/m  AORTIC VALVE AV Area (Vmax):    4.30 cm AV Area (Vmean):   3.80 cm AV Area (VTI):     3.63 cm AV Vmax:  113.00 cm/s AV Vmean:          77.600 cm/s AV VTI:            0.239 m AV Peak Grad:      5.1 mmHg AV Mean Grad:      3.0 mmHg LVOT Vmax:         117.00 cm/s LVOT Vmean:        71.000 cm/s LVOT VTI:          0.209 m LVOT/AV VTI ratio: 0.87  AORTA Ao Root diam: 2.90 cm Ao Asc diam:  3.20 cm MITRAL VALVE               TRICUSPID VALVE MV Area (PHT): 2.99 cm    TR Peak grad:   16.0 mmHg MV Decel Time: 254 msec    TR Vmax:        200.00 cm/s MV E velocity: 43.40 cm/s MV A velocity: 78.00 cm/s  SHUNTS MV E/A ratio:  0.56        Systemic VTI:  0.21 m                            Systemic Diam: 2.30 cm Oswaldo Milian MD Electronically signed by Oswaldo Milian MD Signature Date/Time: 09/22/2021/4:41:08 PM    Final    VAS US DOPPLER PRE CABG  Result Date: 09/22/2021 PREOPERATIVE VASCULAR EVALUATION Patient Name:  William [name] [facility]   Date of Exam:   09/22/2021 Medical Rec #: [id number]         Accession #:    [id number] Date of Birth: 1941/10/08        Patient Gender: M Patient Age:   87 years Exam [facility]  [facility] Procedure:      VAS US DOPPLER PRE CABG Referring Phys: HARRELL LIGHTFOOT --------------------------------------------------------------------------------  Indications:      Pre-CABG. Risk Factors:     Hypertension, hyperlipidemia, coronary artery disease. Comparison Study: No prior study Performing Technologist: Maudry Mayhew MHA, RVT, RDCS, RDMS  Examination Guidelines: A complete evaluation includes B-mode imaging, spectral Doppler, color Doppler, and power Doppler as needed of all accessible portions of each vessel. Bilateral testing is considered an integral part of a complete examination. Limited examinations for reoccurring indications may be performed as noted.  Right Carotid Findings: +----------+--------+--------+--------+-----------------------+--------+           PSV cm/sEDV cm/sStenosisDescribe               Comments +----------+--------+--------+--------+-----------------------+--------+ CCA Prox  95      25                                              +----------+--------+--------+--------+-----------------------+--------+ CCA Distal77      20              smooth and heterogenous         +----------+--------+--------+--------+-----------------------+--------+ ICA Prox  57      18                                              +----------+--------+--------+--------+-----------------------+--------+ ICA Distal65      28                                              +----------+--------+--------+--------+-----------------------+--------+  ECA       74      14                                              +----------+--------+--------+--------+-----------------------+--------+ +----------+--------+-------+----------------+------------+           PSV cm/sEDV  cmsDescribe        Arm Pressure +----------+--------+-------+----------------+------------+ Subclavian101            Multiphasic, WNL             +----------+--------+-------+----------------+------------+ +---------+--------+--+--------+--+---------+ VertebralPSV cm/s40EDV cm/s14Antegrade +---------+--------+--+--------+--+---------+ Left Carotid Findings: +----------+--------+--------+--------+-----------------------+--------+           PSV cm/sEDV cm/sStenosisDescribe               Comments +----------+--------+--------+--------+-----------------------+--------+ CCA Prox  108     27                                              +----------+--------+--------+--------+-----------------------+--------+ CCA Distal79      24                                              +----------+--------+--------+--------+-----------------------+--------+ ICA Prox  77      20              smooth and heterogenous         +----------+--------+--------+--------+-----------------------+--------+ ICA Distal74      29                                              +----------+--------+--------+--------+-----------------------+--------+ ECA       102     13                                              +----------+--------+--------+--------+-----------------------+--------+ +----------+--------+--------+----------------+------------+ SubclavianPSV cm/sEDV cm/sDescribe        Arm Pressure +----------+--------+--------+----------------+------------+           111             Multiphasic, WNL             +----------+--------+--------+----------------+------------+ +---------+--------+--+--------+--+---------+ VertebralPSV cm/s38EDV cm/s12Antegrade +---------+--------+--+--------+--+---------+  ABI Findings: +--------+------------------+-----+---------+--------+ Right   Rt Pressure (mmHg)IndexWaveform Comment  +--------+------------------+-----+---------+--------+  [id number]                    triphasic         +--------+------------------+-----+---------+--------+ PTA     161               1.35 triphasic         +--------+------------------+-----+---------+--------+ DP      150               1.26 triphasic         +--------+------------------+-----+---------+--------+ +--------+------------------+-----+---------+-------+ Left    Lt Pressure (mmHg)IndexWaveform Comment +--------+------------------+-----+---------+-------+ Brachial  triphasic        +--------+------------------+-----+---------+-------+ PTA     143               1.20 triphasic        +--------+------------------+-----+---------+-------+ DP      160               1.34 triphasic        +--------+------------------+-----+---------+-------+ +-------+---------------+----------------+ ABI/TBIToday's ABI/TBIPrevious ABI/TBI +-------+---------------+----------------+ Right  1.36                            +-------+---------------+----------------+ Left   1.34                            +-------+---------------+----------------+  Right Doppler Findings: +-----------+--------+-----+---------+--------------------+ Site       PressureIndexDoppler  Comments             +-----------+--------+-----+---------+--------------------+ Brachial   119          triphasic                     +-----------+--------+-----+---------+--------------------+ Radial                  triphasic                     +-----------+--------+-----+---------+--------------------+ Ulnar                   triphasic                     +-----------+--------+-----+---------+--------------------+ Palmar Arch                      Within normal limits +-----------+--------+-----+---------+--------------------+  Left Doppler Findings: +-----------+--------+-----+---------+--------------------+ Site       PressureIndexDoppler  Comments              +-----------+--------+-----+---------+--------------------+ Brachial                triphasic                     +-----------+--------+-----+---------+--------------------+ Radial                  triphasic                     +-----------+--------+-----+---------+--------------------+ Ulnar                   triphasic                     +-----------+--------+-----+---------+--------------------+ Palmar Arch                      Within normal limits +-----------+--------+-----+---------+--------------------+  Summary: Right Carotid: Velocities in the right ICA are consistent with a 1-39% stenosis. Left Carotid: Velocities in the left ICA are consistent with a 1-39% stenosis. Vertebrals:  Bilateral vertebral arteries demonstrate antegrade flow. Subclavians: Normal flow hemodynamics were seen in bilateral subclavian              arteries. Right ABI: Resting right ankle-brachial index is within normal range. No evidence of significant right lower extremity arterial disease. Left ABI: Resting left ankle-brachial index is within normal range. No evidence of significant left lower extremity arterial disease. Right Upper Extremity: Doppler waveforms remain within normal limits with right radial compression. Doppler waveforms remain within normal limits  with right ulnar compression. Left Upper Extremity: Doppler waveforms remain within normal limits with left radial compression. Doppler waveforms remain within normal limits with left ulnar compression.  Electronically signed by Deitra Mayo MD on 09/22/2021 at 1:25:34 PM.    Final     Cardiac Studies   TTE 09/22/21  1. Left ventricular ejection fraction, by estimation, is 60 to 65%. The  left ventricle has normal function. The left ventricle has no regional  wall motion abnormalities. There is mild left ventricular hypertrophy.  Left ventricular diastolic parameters  are consistent with Grade I diastolic dysfunction (impaired  relaxation).   2. Right ventricular systolic function is normal. The right ventricular  size is normal. There is normal pulmonary artery systolic pressure. The  estimated right ventricular systolic pressure is 44.0 mmHg.   3. The mitral valve is normal in structure. Trivial mitral valve  regurgitation.   4. The aortic valve was not well visualized. Aortic valve regurgitation  is not visualized. Aortic valve sclerosis/calcification is present,  without any evidence of aortic stenosis.   5. The inferior vena cava is normal in size with greater than 50%  respiratory variability, suggesting right atrial pressure of 3 mmHg.   LHC 09/22/2021   2nd Mrg lesion is 75% stenosed.   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   Mid LM lesion is 90% stenosed.   The left ventricular systolic function is normal.   LV end diastolic pressure is mildly elevated.   The left ventricular ejection fraction is 50-55% by visual estimate.  Patient Profile     80 y.o. male w/ PMH of CAD (nonobstructive CAD by cath in 2017), HTN and HLD who presented to [facility] ED on 09/20/2021 for evaluation of chest pain. Found to have an NSTEMI, here with LM dx pending evaluation for CABG planned for Tue per pt    Assessment & Plan    #LM disease: he presented with NSTEMI from [facility]. He had LHC showing significant LM disease. 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

## 2021-09-23 NOTE — Progress Notes (Signed)
ANTICOAGULATION CONSULT NOTE Pharmacy Consult for Heparin Indication: chest pain/ACS  Allergies  Allergen Reactions   Codeine Nausea And Vomiting and Other (See Comments)    "deathly sick"   Lipitor [Atorvastatin] Other (See Comments)    Joint pain    Patient Measurements: Height: 5\' 9"  (175.3 cm) Weight: 97.1 kg (214 lb) IBW/kg (Calculated) : 70.7 Heparin Dosing Weight:    Vital Signs: Temp: 98.4 F (36.9 C) (12/03 0443) Temp Source: Oral (12/03 0443) BP: 127/80 (12/03 0443) Pulse Rate: 65 (12/03 0443)  Labs: Recent Labs    09/20/21 1145 09/20/21 1415 09/20/21 2304 09/21/21 0353 09/21/21 0812 09/21/21 2245 09/22/21 0756 09/23/21 0211  HGB 15.6  --   --  15.7  --   --  15.5 14.6  HCT 47.4  --   --  46.7  --   --  46.7 43.7  PLT 174  --   --  158  --   --  166 156  HEPARINUNFRC  --   --  0.10*  --    < > 0.20* 0.43 0.37  CREATININE 1.09  --   --   --   --   --  1.01  --   TROPONINIHS 18* 125* 812*  --   --   --   --   --    < > = values in this interval not displayed.     Estimated Creatinine Clearance: 66 mL/min (by C-G formula based on SCr of 1.01 mg/dL).  Assessment: 80 y.o. male with CAD s/p cath, awaiting poss CABG, for heparin.   Heparin level continues to be therapeutic. Plan CABG next week. CBC stable  Goal of Therapy:  Heparin level 0.3-0.7 units/ml Monitor platelets by anticoagulation protocol: Yes   Plan:  Continue heparin infusion at 1650 units/hr Monitor daily HL, CBC, and for s/sx of bleeding   Onnie Boer, PharmD, BCIDP, AAHIVP, CPP Infectious Disease Pharmacist 09/23/2021 10:06 AM

## 2021-09-24 DIAGNOSIS — I2510 Atherosclerotic heart disease of native coronary artery without angina pectoris: Secondary | ICD-10-CM | POA: Diagnosis not present

## 2021-09-24 LAB — CBC
HCT: 43.8 % (ref 39.0–52.0)
Hemoglobin: 14.8 g/dL (ref 13.0–17.0)
MCH: 30.6 pg (ref 26.0–34.0)
MCHC: 33.8 g/dL (ref 30.0–36.0)
MCV: 90.7 fL (ref 80.0–100.0)
Platelets: 149 10*3/uL — ABNORMAL LOW (ref 150–400)
RBC: 4.83 MIL/uL (ref 4.22–5.81)
RDW: 13.2 % (ref 11.5–15.5)
WBC: 7.6 10*3/uL (ref 4.0–10.5)
nRBC: 0 % (ref 0.0–0.2)

## 2021-09-24 LAB — GLUCOSE, CAPILLARY
Glucose-Capillary: 129 mg/dL — ABNORMAL HIGH (ref 70–99)
Glucose-Capillary: 130 mg/dL — ABNORMAL HIGH (ref 70–99)
Glucose-Capillary: 146 mg/dL — ABNORMAL HIGH (ref 70–99)
Glucose-Capillary: 150 mg/dL — ABNORMAL HIGH (ref 70–99)
Glucose-Capillary: 159 mg/dL — ABNORMAL HIGH (ref 70–99)

## 2021-09-24 LAB — HEPARIN LEVEL (UNFRACTIONATED): Heparin Unfractionated: 0.37 IU/mL (ref 0.30–0.70)

## 2021-09-24 NOTE — Progress Notes (Signed)
Progress Note  Patient Name: William [name] [facility] Date of Encounter: 09/24/2021  [facility] Cardiologist: Sinclair Grooms, MD   Subjective   William [name] is doing well this AM. He seems to be feeling better. He denies chest pain   RA HDS. Bps better controlled HR > 60 Normal renal fxn Hgb normal  LHC 09/22/2021   2nd Mrg lesion is 75% stenosed.   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   Mid LM lesion is 90% stenosed.   The left ventricular systolic function is normal.   LV end diastolic pressure is mildly elevated.   The left ventricular ejection fraction is 50-55% by visual estimate.  Inpatient Medications    Scheduled Meds:  aspirin  81 mg Oral Daily   ezetimibe  10 mg Oral Daily   hydrALAZINE  50 mg Oral Q8H   insulin aspart  0-9 Units Subcutaneous TID AC   metoprolol tartrate  100 mg Oral BID   rosuvastatin  10 mg Oral Daily   sodium chloride flush  3 mL Intravenous Q12H   Continuous Infusions:  sodium chloride     heparin 1,650 Units/hr (09/24/21 0146)   PRN Meds: sodium chloride, acetaminophen, LORazepam, nitroGLYCERIN, ondansetron (ZOFRAN) IV, ondansetron **OR** [DISCONTINUED] ondansetron (ZOFRAN) IV, polyethylene glycol, sodium chloride flush   Vital Signs    Vitals:   09/23/21 0443 09/23/21 1518 09/23/21 2134 09/24/21 0604  BP: 127/80 122/62 125/67 119/81  Pulse: 65 65 71 70  Resp: 17 15 17 17   Temp: 98.4 F (36.9 C) 97.9 F (36.6 C) 98.2 F (36.8 C) 97.8 F (36.6 C)  TempSrc: Oral Oral Oral Oral  SpO2: 97% 100% 98% 98%  Weight:      Height:        Intake/Output Summary (Last 24 hours) at 09/24/2021 0958 Last data filed at 09/23/2021 1600 Gross per 24 hour  Intake 1091.75 ml  Output --  Net 1091.75 ml   Last 3 Weights 09/20/2021 03/03/2020 02/15/2020  Weight (lbs) 214 lb 219 lb 219 lb 12.8 oz  Weight (kg) 97.07 kg 99.338 kg 99.701 kg      Telemetry    Normal sinus rhythm - Personally Reviewed  ECG    NA - Personally Reviewed  Physical Exam    Vitals:   09/23/21 2134 09/24/21 0604  BP: 125/67 119/81  Pulse: 71 70  Resp: 17 17  Temp: 98.2 F (36.8 C) 97.8 F (36.6 C)  SpO2: 98% 98%    GEN: No acute distress.  comfortable Neck: No JVD HEENT: moist MM Cardiac: RRR, no murmurs, rubs, or gallops.  Respiratory: Clear to auscultation bilaterally. GI: Soft, nontender, non-distended  MS: No edema; No deformity. Skin: warm and well perfused Neuro:  Nonfocal  Psych: feels well  Labs    High Sensitivity Troponin:   Recent Labs  Lab 09/20/21 1145 09/20/21 1415 09/20/21 2304  TROPONINIHS 18* 125* 812*     Chemistry Recent Labs  Lab 09/20/21 1145 09/22/21 0756  NA 137 137  K 4.1 4.2  CL 106 106  CO2 26 26  GLUCOSE 184* 143*  BUN 17 12  CREATININE 1.09 1.01  CALCIUM 8.9 9.2  GFRNONAA >60 >60  ANIONGAP 5 5    Lipids  Recent Labs  Lab 09/21/21 0353  CHOL 139  TRIG 142  HDL 32*  LDLCALC 79  CHOLHDL 4.3    Hematology Recent Labs  Lab 09/22/21 0756 09/23/21 0211 09/24/21 0413  WBC 7.1 7.1 7.6  RBC 5.15 4.81  4.83  HGB 15.5 14.6 14.8  HCT 46.7 43.7 43.8  MCV 90.7 90.9 90.7  MCH 30.1 30.4 30.6  MCHC 33.2 33.4 33.8  RDW 12.9 12.9 13.2  PLT 166 156 149*   Thyroid No results for input(s): TSH, FREET4 in the last 168 hours.  BNPNo results for input(s): BNP, PROBNP in the last 168 hours.  DDimer No results for input(s): DDIMER in the last 168 hours.   Radiology    ECHOCARDIOGRAM COMPLETE  Result Date: 09/22/2021    ECHOCARDIOGRAM REPORT   Patient Name:   William [name] [facility] Date of Exam: 09/22/2021 Medical Rec #:  [id number]        Height:       69.0 in Accession #:    [id number]       Weight:       214.0 lb Date of Birth:  Nov 15, 1940       BSA:          2.126 m Patient Age:    80 years         BP:           118/76 mmHg Patient Gender: M                HR:           66 bpm. Exam Location:  Inpatient Procedure: 2D Echo, Cardiac Doppler and Color Doppler Indications:    Pre-bypass  History:        Patient  has prior history of Echocardiogram examinations, most                 recent 07/07/2016. CAD; Cardiac cath yesterday.  Sonographer:    Merrie Roof RDCS Referring Phys: [id number] [place]  1. Left ventricular ejection fraction, by estimation, is 60 to 65%. The left ventricle has normal function. The left ventricle has no regional wall motion abnormalities. There is mild left ventricular hypertrophy. Left ventricular diastolic parameters are consistent with Grade I diastolic dysfunction (impaired relaxation).  2. Right ventricular systolic function is normal. The right ventricular size is normal. There is normal pulmonary artery systolic pressure. The estimated right ventricular systolic pressure is 10.6 mmHg.  3. The mitral valve is normal in structure. Trivial mitral valve regurgitation.  4. The aortic valve was not well visualized. Aortic valve regurgitation is not visualized. Aortic valve sclerosis/calcification is present, without any evidence of aortic stenosis.  5. The inferior vena cava is normal in size with greater than 50% respiratory variability, suggesting right atrial pressure of 3 mmHg. FINDINGS  Left Ventricle: Left ventricular ejection fraction, by estimation, is 60 to 65%. The left ventricle has normal function. The left ventricle has no regional wall motion abnormalities. The left ventricular internal cavity size was normal in size. There is  mild left ventricular hypertrophy. Left ventricular diastolic parameters are consistent with Grade I diastolic dysfunction (impaired relaxation). Right Ventricle: The right ventricular size is normal. No increase in right ventricular wall thickness. Right ventricular systolic function is normal. There is normal pulmonary artery systolic pressure. The tricuspid regurgitant velocity is 2.00 m/s, and  with an assumed right atrial pressure of 3 mmHg, the estimated right ventricular systolic pressure is 26.9 mmHg. Left Atrium: Left atrial size  was normal in size. Right Atrium: Right atrial size was normal in size. Pericardium: Trivial pericardial effusion is present. Mitral Valve: The mitral valve is normal in structure. Trivial mitral valve regurgitation. Tricuspid Valve: The tricuspid valve is normal in structure. Tricuspid  valve regurgitation is trivial. Aortic Valve: The aortic valve was not well visualized. Aortic valve regurgitation is not visualized. Aortic valve sclerosis/calcification is present, without any evidence of aortic stenosis. Aortic valve mean gradient measures 3.0 mmHg. Aortic valve peak gradient measures 5.1 mmHg. Aortic valve area, by VTI measures 3.63 cm. Pulmonic Valve: The pulmonic valve was not well visualized. Pulmonic valve regurgitation is trivial. Aorta: The aortic root and ascending aorta are structurally normal, with no evidence of dilitation. Venous: The inferior vena cava is normal in size with greater than 50% respiratory variability, suggesting right atrial pressure of 3 mmHg. IAS/Shunts: The interatrial septum was not well visualized.  LEFT VENTRICLE PLAX 2D LVIDd:         4.10 cm   Diastology LVIDs:         2.70 cm   LV e' medial:    5.33 cm/s LV PW:         1.30 cm   LV E/e' medial:  8.1 LV IVS:        1.30 cm   LV e' lateral:   5.98 cm/s LVOT diam:     2.30 cm   LV E/e' lateral: 7.3 LV SV:         87 LV SV Index:   41 LVOT Area:     4.15 cm  RIGHT VENTRICLE RV Basal diam:  3.80 cm RV S prime:     11.40 cm/s TAPSE (M-mode): 2.4 cm LEFT ATRIUM             Index        RIGHT ATRIUM           Index LA diam:        4.20 cm 1.98 cm/m   RA Area:     15.60 cm LA Vol (A2C):   35.9 ml 16.88 ml/m  RA Volume:   33.50 ml  15.76 ml/m LA Vol (A4C):   45.5 ml 21.40 ml/m LA Biplane Vol: 41.4 ml 19.47 ml/m  AORTIC VALVE AV Area (Vmax):    4.30 cm AV Area (Vmean):   3.80 cm AV Area (VTI):     3.63 cm AV Vmax:           113.00 cm/s AV Vmean:          77.600 cm/s AV VTI:            0.239 m AV Peak Grad:      5.1 mmHg AV Mean  Grad:      3.0 mmHg LVOT Vmax:         117.00 cm/s LVOT Vmean:        71.000 cm/s LVOT VTI:          0.209 m LVOT/AV VTI ratio: 0.87  AORTA Ao Root diam: 2.90 cm Ao Asc diam:  3.20 cm MITRAL VALVE               TRICUSPID VALVE MV Area (PHT): 2.99 cm    TR Peak grad:   16.0 mmHg MV Decel Time: 254 msec    TR Vmax:        200.00 cm/s MV E velocity: 43.40 cm/s MV A velocity: 78.00 cm/s  SHUNTS MV E/A ratio:  0.56        Systemic VTI:  0.21 m                            Systemic Diam: 2.30 cm Harrell Gave  Gardiner Rhyme MD Electronically signed by Oswaldo Milian MD Signature Date/Time: 09/22/2021/4:41:08 PM    Final    VAS US DOPPLER PRE CABG  Result Date: 09/22/2021 PREOPERATIVE VASCULAR EVALUATION Patient Name:  William [name] [facility]  Date of Exam:   09/22/2021 Medical Rec #: [id number]         Accession #:    [id number] Date of Birth: 09-17-1941        Patient Gender: M Patient Age:   57 years Exam Location:  [facility] Procedure:      VAS US DOPPLER PRE CABG Referring Phys: HARRELL LIGHTFOOT --------------------------------------------------------------------------------  Indications:      Pre-CABG. Risk Factors:     Hypertension, hyperlipidemia, coronary artery disease. Comparison Study: No prior study Performing Technologist: Maudry Mayhew MHA, RVT, RDCS, RDMS  Examination Guidelines: A complete evaluation includes B-mode imaging, spectral Doppler, color Doppler, and power Doppler as needed of all accessible portions of each vessel. Bilateral testing is considered an integral part of a complete examination. Limited examinations for reoccurring indications may be performed as noted.  Right Carotid Findings: +----------+--------+--------+--------+-----------------------+--------+           PSV cm/sEDV cm/sStenosisDescribe               Comments +----------+--------+--------+--------+-----------------------+--------+ CCA Prox  95      25                                               +----------+--------+--------+--------+-----------------------+--------+ CCA Distal77      20              smooth and heterogenous         +----------+--------+--------+--------+-----------------------+--------+ ICA Prox  57      18                                              +----------+--------+--------+--------+-----------------------+--------+ ICA Distal65      28                                              +----------+--------+--------+--------+-----------------------+--------+ ECA       74      14                                              +----------+--------+--------+--------+-----------------------+--------+ +----------+--------+-------+----------------+------------+           PSV cm/sEDV cmsDescribe        Arm Pressure +----------+--------+-------+----------------+------------+ Subclavian101            Multiphasic, WNL             +----------+--------+-------+----------------+------------+ +---------+--------+--+--------+--+---------+ VertebralPSV cm/s40EDV cm/s14Antegrade +---------+--------+--+--------+--+---------+ Left Carotid Findings: +----------+--------+--------+--------+-----------------------+--------+           PSV cm/sEDV cm/sStenosisDescribe               Comments +----------+--------+--------+--------+-----------------------+--------+ CCA Prox  108     27                                              +----------+--------+--------+--------+-----------------------+--------+  CCA Distal79      24                                              +----------+--------+--------+--------+-----------------------+--------+ ICA Prox  77      20              smooth and heterogenous         +----------+--------+--------+--------+-----------------------+--------+ ICA Distal74      29                                              +----------+--------+--------+--------+-----------------------+--------+ ECA       102     13                                               +----------+--------+--------+--------+-----------------------+--------+ +----------+--------+--------+----------------+------------+ SubclavianPSV cm/sEDV cm/sDescribe        Arm Pressure +----------+--------+--------+----------------+------------+           111             Multiphasic, WNL             +----------+--------+--------+----------------+------------+ +---------+--------+--+--------+--+---------+ VertebralPSV cm/s38EDV cm/s12Antegrade +---------+--------+--+--------+--+---------+  ABI Findings: +--------+------------------+-----+---------+--------+ Right   Rt Pressure (mmHg)IndexWaveform Comment  +--------+------------------+-----+---------+--------+ [id number]                    triphasic         +--------+------------------+-----+---------+--------+ PTA     161               1.35 triphasic         +--------+------------------+-----+---------+--------+ DP      150               1.26 triphasic         +--------+------------------+-----+---------+--------+ +--------+------------------+-----+---------+-------+ Left    Lt Pressure (mmHg)IndexWaveform Comment +--------+------------------+-----+---------+-------+ Brachial                       triphasic        +--------+------------------+-----+---------+-------+ PTA     143               1.20 triphasic        +--------+------------------+-----+---------+-------+ DP      160               1.34 triphasic        +--------+------------------+-----+---------+-------+ +-------+---------------+----------------+ ABI/TBIToday's ABI/TBIPrevious ABI/TBI +-------+---------------+----------------+ Right  1.36                            +-------+---------------+----------------+ Left   1.34                            +-------+---------------+----------------+  Right Doppler Findings: +-----------+--------+-----+---------+--------------------+ Site        PressureIndexDoppler  Comments             +-----------+--------+-----+---------+--------------------+ Brachial   119          triphasic                     +-----------+--------+-----+---------+--------------------+  Radial                  triphasic                     +-----------+--------+-----+---------+--------------------+ Ulnar                   triphasic                     +-----------+--------+-----+---------+--------------------+ Palmar Arch                      Within normal limits +-----------+--------+-----+---------+--------------------+  Left Doppler Findings: +-----------+--------+-----+---------+--------------------+ Site       PressureIndexDoppler  Comments             +-----------+--------+-----+---------+--------------------+ Brachial                triphasic                     +-----------+--------+-----+---------+--------------------+ Radial                  triphasic                     +-----------+--------+-----+---------+--------------------+ Ulnar                   triphasic                     +-----------+--------+-----+---------+--------------------+ Palmar Arch                      Within normal limits +-----------+--------+-----+---------+--------------------+  Summary: Right Carotid: Velocities in the right ICA are consistent with a 1-39% stenosis. Left Carotid: Velocities in the left ICA are consistent with a 1-39% stenosis. Vertebrals:  Bilateral vertebral arteries demonstrate antegrade flow. Subclavians: Normal flow hemodynamics were seen in bilateral subclavian              arteries. Right ABI: Resting right ankle-brachial index is within normal range. No evidence of significant right lower extremity arterial disease. Left ABI: Resting left ankle-brachial index is within normal range. No evidence of significant left lower extremity arterial disease. Right Upper Extremity: Doppler waveforms remain within normal  limits with right radial compression. Doppler waveforms remain within normal limits with right ulnar compression. Left Upper Extremity: Doppler waveforms remain within normal limits with left radial compression. Doppler waveforms remain within normal limits with left ulnar compression.  Electronically signed by Deitra Mayo MD on 09/22/2021 at 1:25:34 PM.    Final     Cardiac Studies   TTE 09/22/21  1. Left ventricular ejection fraction, by estimation, is 60 to 65%. The  left ventricle has normal function. The left ventricle has no regional  wall motion abnormalities. There is mild left ventricular hypertrophy.  Left ventricular diastolic parameters  are consistent with Grade I diastolic dysfunction (impaired relaxation).   2. Right ventricular systolic function is normal. The right ventricular  size is normal. There is normal pulmonary artery systolic pressure. The  estimated right ventricular systolic pressure is 42.6 mmHg.   3. The mitral valve is normal in structure. Trivial mitral valve  regurgitation.   4. The aortic valve was not well visualized. Aortic valve regurgitation  is not visualized. Aortic valve sclerosis/calcification is present,  without any evidence of aortic stenosis.   5. The inferior vena cava is normal in size with greater than 50%  respiratory variability, suggesting right atrial  pressure of 3 mmHg.   LHC 09/22/2021   2nd Mrg lesion is 75% stenosed.   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   Mid LM lesion is 90% stenosed.   The left ventricular systolic function is normal.   LV end diastolic pressure is mildly elevated.   The left ventricular ejection fraction is 50-55% by visual estimate.  Patient Profile     80 y.o. male w/ PMH of CAD (nonobstructive CAD by cath in 2017), HTN and HLD who presented to [facility] ED on 09/20/2021 for evaluation of chest pain. Found to have an NSTEMI, here with LM dx pending evaluation for CABG planned for Tue per pt    Assessment  & Plan    #LM disease/multivessel dx: he presented with NSTEMI from [facility]. He had LHC showing significant LM disease. 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

## 2021-09-24 NOTE — Progress Notes (Signed)
ANTICOAGULATION CONSULT NOTE Pharmacy Consult for Heparin Indication: chest pain/ACS  Allergies  Allergen Reactions   Codeine Nausea And Vomiting and Other (See Comments)    "deathly sick"   Lipitor [Atorvastatin] Other (See Comments)    Joint pain    Patient Measurements: Height: 5\' 9"  (175.3 cm) Weight: 97.1 kg (214 lb) IBW/kg (Calculated) : 70.7 Heparin Dosing Weight:    Vital Signs: Temp: 97.8 F (36.6 C) (12/04 0604) Temp Source: Oral (12/04 0604) BP: 119/81 (12/04 0604) Pulse Rate: 70 (12/04 0604)  Labs: Recent Labs    09/22/21 0756 09/23/21 0211 09/24/21 0413  HGB 15.5 14.6 14.8  HCT 46.7 43.7 43.8  PLT 166 156 149*  HEPARINUNFRC 0.43 0.37 0.37  CREATININE 1.01  --   --      Estimated Creatinine Clearance: 66 mL/min (by C-G formula based on SCr of 1.01 mg/dL).  Assessment: 80 y.o. male with CAD s/p cath, awaiting poss CABG, for heparin.   Heparin level continues to be therapeutic. Plan CABG in AM. CBC stable  Goal of Therapy:  Heparin level 0.3-0.7 units/ml Monitor platelets by anticoagulation protocol: Yes   Plan:  Continue heparin infusion at 1650 units/hr Monitor daily HL, CBC, and for s/sx of bleeding   Onnie Boer, PharmD, BCIDP, AAHIVP, CPP Infectious Disease Pharmacist 09/24/2021 10:09 AM

## 2021-09-24 NOTE — Plan of Care (Signed)
  Problem: Clinical Measurements: Goal: Ability to maintain clinical measurements within normal limits will improve Outcome: Progressing Goal: Cardiovascular complication will be avoided Outcome: Progressing   Problem: Activity: Goal: Risk for activity intolerance will decrease Outcome: Progressing   

## 2021-09-25 LAB — CBC
HCT: 43 % (ref 39.0–52.0)
Hemoglobin: 14.3 g/dL (ref 13.0–17.0)
MCH: 30.2 pg (ref 26.0–34.0)
MCHC: 33.3 g/dL (ref 30.0–36.0)
MCV: 90.7 fL (ref 80.0–100.0)
Platelets: 146 10*3/uL — ABNORMAL LOW (ref 150–400)
RBC: 4.74 MIL/uL (ref 4.22–5.81)
RDW: 13.2 % (ref 11.5–15.5)
WBC: 7.8 10*3/uL (ref 4.0–10.5)
nRBC: 0 % (ref 0.0–0.2)

## 2021-09-25 LAB — BLOOD GAS, ARTERIAL
Acid-base deficit: 0.5 mmol/L (ref 0.0–2.0)
Bicarbonate: 23.7 mmol/L (ref 20.0–28.0)
Drawn by: 51185
FIO2: 21
O2 Saturation: 95.5 %
Patient temperature: 37
pCO2 arterial: 39.9 mmHg (ref 32.0–48.0)
pH, Arterial: 7.392 (ref 7.350–7.450)
pO2, Arterial: 79.6 mmHg — ABNORMAL LOW (ref 83.0–108.0)

## 2021-09-25 LAB — COMPREHENSIVE METABOLIC PANEL
ALT: 31 U/L (ref 0–44)
AST: 23 U/L (ref 15–41)
Albumin: 3.4 g/dL — ABNORMAL LOW (ref 3.5–5.0)
Alkaline Phosphatase: 55 U/L (ref 38–126)
Anion gap: 5 (ref 5–15)
BUN: 14 mg/dL (ref 8–23)
CO2: 25 mmol/L (ref 22–32)
Calcium: 9.1 mg/dL (ref 8.9–10.3)
Chloride: 106 mmol/L (ref 98–111)
Creatinine, Ser: 1.12 mg/dL (ref 0.61–1.24)
GFR, Estimated: >60 mL/min (ref >60)
Glucose, Bld: 168 mg/dL — ABNORMAL HIGH (ref 70–99)
Potassium: 3.7 mmol/L (ref 3.5–5.1)
Sodium: 136 mmol/L (ref 135–145)
Total Bilirubin: 0.7 mg/dL (ref 0.3–1.2)
Total Protein: 6.3 g/dL — ABNORMAL LOW (ref 6.5–8.1)

## 2021-09-25 LAB — TYPE AND SCREEN
ABO/RH(D): O NEG
Antibody Screen: NEGATIVE

## 2021-09-25 LAB — GLUCOSE, CAPILLARY
Glucose-Capillary: 140 mg/dL — ABNORMAL HIGH (ref 70–99)
Glucose-Capillary: 182 mg/dL — ABNORMAL HIGH (ref 70–99)
Glucose-Capillary: 201 mg/dL — ABNORMAL HIGH (ref 70–99)
Glucose-Capillary: 189 mg/dL — ABNORMAL HIGH (ref 70–99)

## 2021-09-25 LAB — APTT: aPTT: 92 seconds — ABNORMAL HIGH (ref 24–36)

## 2021-09-25 LAB — PROTIME-INR
INR: 1.1 (ref 0.8–1.2)
Prothrombin Time: 13.9 seconds (ref 11.4–15.2)

## 2021-09-25 LAB — SURGICAL PCR SCREEN
MRSA, PCR: NEGATIVE
Staphylococcus aureus: NEGATIVE

## 2021-09-25 LAB — ABO/RH: ABO/RH(D): O NEG

## 2021-09-25 LAB — HEPARIN LEVEL (UNFRACTIONATED): Heparin Unfractionated: 0.34 IU/mL (ref 0.30–0.70)

## 2021-09-25 MED ORDER — HEPARIN 30,000 UNITS/1000 ML (OHS) CELLSAVER SOLUTION
Status: DC
  Filled 2021-09-25: qty 1000

## 2021-09-25 MED ORDER — HYDRALAZINE HCL 25 MG PO TABS: 25.0000 mg | ORAL_TABLET | Freq: Three times a day (TID) | ORAL | Status: DC

## 2021-09-25 MED ORDER — NITROGLYCERIN IN D5W 200-5 MCG/ML-% IV SOLN
2.0000 ug/min | INTRAVENOUS | Status: DC
  Filled 2021-09-25: qty 250

## 2021-09-25 MED ORDER — TEMAZEPAM 15 MG PO CAPS: 15.0000 mg | ORAL_CAPSULE | Freq: Once | ORAL | Status: DC | PRN

## 2021-09-25 MED ORDER — POTASSIUM CHLORIDE 2 MEQ/ML IV SOLN
80.0000 meq | INTRAVENOUS | Status: DC
  Filled 2021-09-25: qty 40

## 2021-09-25 MED ORDER — DEXMEDETOMIDINE HCL IN NACL 400 MCG/100ML IV SOLN
0.1000 ug/kg/h | INTRAVENOUS | Status: AC
  Administered 2021-09-26: .3 ug/kg/h via INTRAVENOUS
  Filled 2021-09-25: qty 100

## 2021-09-25 MED ORDER — TRANEXAMIC ACID (OHS) PUMP PRIME SOLUTION
2.0000 mg/kg | INTRAVENOUS | Status: DC
  Filled 2021-09-25: qty 1.94

## 2021-09-25 MED ORDER — METOPROLOL TARTRATE 12.5 MG HALF TABLET
12.5000 mg | ORAL_TABLET | Freq: Once | ORAL | Status: AC
  Administered 2021-09-26: 12.5 mg via ORAL
  Filled 2021-09-25: qty 1

## 2021-09-25 MED ORDER — PLASMA-LYTE A IV SOLN
INTRAVENOUS | Status: DC
  Filled 2021-09-25: qty 5

## 2021-09-25 MED ORDER — NOREPINEPHRINE 4 MG/250ML-% IV SOLN
0.0000 ug/min | INTRAVENOUS | Status: AC
  Administered 2021-09-26: 2 ug/min via INTRAVENOUS
  Filled 2021-09-25: qty 250

## 2021-09-25 MED ORDER — MILRINONE LACTATE IN DEXTROSE 20-5 MG/100ML-% IV SOLN
0.3000 ug/kg/min | INTRAVENOUS | Status: DC
  Filled 2021-09-25: qty 100

## 2021-09-25 MED ORDER — CHLORHEXIDINE GLUCONATE CLOTH 2 % EX PADS
6.0000 | MEDICATED_PAD | Freq: Once | CUTANEOUS | Status: AC
  Administered 2021-09-26: 6 via TOPICAL

## 2021-09-25 MED ORDER — VANCOMYCIN HCL 1250 MG/250ML IV SOLN
1250.0000 mg | INTRAVENOUS | Status: AC
  Administered 2021-09-26: 1250 mg via INTRAVENOUS
  Filled 2021-09-25: qty 250

## 2021-09-25 MED ORDER — INSULIN REGULAR(HUMAN) IN NACL 100-0.9 UT/100ML-% IV SOLN
INTRAVENOUS | Status: AC
  Administered 2021-09-26: 1.5 [IU]/h via INTRAVENOUS
  Filled 2021-09-25: qty 100

## 2021-09-25 MED ORDER — PHENYLEPHRINE HCL-NACL 20-0.9 MG/250ML-% IV SOLN
30.0000 ug/min | INTRAVENOUS | Status: AC
  Administered 2021-09-26: 40 ug/min via INTRAVENOUS
  Filled 2021-09-25: qty 250

## 2021-09-25 MED ORDER — BISACODYL 5 MG PO TBEC: 5.0000 mg | DELAYED_RELEASE_TABLET | Freq: Once | ORAL | Status: DC

## 2021-09-25 MED ORDER — TRANEXAMIC ACID (OHS) BOLUS VIA INFUSION
15.0000 mg/kg | INTRAVENOUS | Status: AC
  Administered 2021-09-26: 1456.5 mg via INTRAVENOUS
  Filled 2021-09-25: qty 1457

## 2021-09-25 MED ORDER — MANNITOL 20 % IV SOLN
INTRAVENOUS | Status: DC
  Filled 2021-09-25: qty 13

## 2021-09-25 MED ORDER — EPINEPHRINE HCL 5 MG/250ML IV SOLN IN NS
0.0000 ug/min | INTRAVENOUS | Status: DC
  Filled 2021-09-25: qty 250

## 2021-09-25 MED ORDER — TRANEXAMIC ACID 1000 MG/10ML IV SOLN
1.5000 mg/kg/h | INTRAVENOUS | Status: AC
  Administered 2021-09-26: 1.5 mg/kg/h via INTRAVENOUS
  Filled 2021-09-25: qty 25

## 2021-09-25 MED ORDER — CHLORHEXIDINE GLUCONATE CLOTH 2 % EX PADS
6.0000 | MEDICATED_PAD | Freq: Once | CUTANEOUS | Status: AC
  Administered 2021-09-25: 6 via TOPICAL

## 2021-09-25 MED ORDER — CEFAZOLIN SODIUM-DEXTROSE 2-4 GM/100ML-% IV SOLN
2.0000 g | INTRAVENOUS | Status: DC
  Filled 2021-09-25: qty 100

## 2021-09-25 MED ORDER — HYDRALAZINE HCL 25 MG PO TABS
25.0000 mg | ORAL_TABLET | Freq: Three times a day (TID) | ORAL | Status: DC
  Administered 2021-09-25 (×2): 25 mg via ORAL
  Filled 2021-09-25 (×2): qty 1

## 2021-09-25 MED ORDER — CHLORHEXIDINE GLUCONATE 0.12 % MT SOLN
15.0000 mL | Freq: Once | OROMUCOSAL | Status: AC
  Administered 2021-09-26: 15 mL via OROMUCOSAL
  Filled 2021-09-25: qty 15

## 2021-09-25 NOTE — Progress Notes (Addendum)
Paged on call doctor. Advised to page Dr. Kipp Brood. Paged Dr. Kipp Brood concerning if PFT is needed. Waiting for response

## 2021-09-25 NOTE — Progress Notes (Signed)
[place] for Heparin Indication: chest pain/ACS  Allergies  Allergen Reactions   Codeine Nausea And Vomiting and Other (See Comments)    "deathly sick"   Lipitor [Atorvastatin] Other (See Comments)    Joint pain    Patient Measurements: Height: 5\' 9"  (175.3 cm) Weight: 97.1 kg (214 lb) IBW/kg (Calculated) : 70.7 Heparin Dosing Weight:    Vital Signs: Temp: 97.7 F (36.5 C) (12/05 0520) Temp Source: Oral (12/05 0520) BP: 117/69 (12/05 0852) Pulse Rate: 78 (12/05 0852)  Labs: Recent Labs    09/23/21 0211 09/24/21 0413 09/25/21 0303  HGB 14.6 14.8 14.3  HCT 43.7 43.8 43.0  PLT 156 149* 146*  HEPARINUNFRC 0.37 0.37 0.34     Estimated Creatinine Clearance: 66 mL/min (by C-G formula based on SCr of 1.01 mg/dL).  Assessment: 80 y.o. male with CAD s/p cath, awaiting poss CABG, for heparin.   Heparin level is therapeutic, CBC stable, planning CABG tomorrow.  Goal of Therapy:  Heparin level 0.3-0.7 units/ml Monitor platelets by anticoagulation protocol: Yes   Plan:  Continue heparin infusion at 1650 units/hr Monitor daily HL, CBC, and for s/sx of bleeding   Arrie Senate, PharmD, BCPS, [facility] Clinical Pharmacist [phone] Please check AMION for all [place] numbers 09/25/2021

## 2021-09-25 NOTE — Progress Notes (Signed)
Progress Note  Patient Name: William [name] [facility] Date of Encounter: 09/25/2021  Primary Cardiologist: Sinclair Grooms, MD   Subjective   Patient seen and examined at his bedside.  His wife is by the bedside.  He offers no complaints at this time.  He is aware of his coronary artery bypass grafting which is scheduled for tomorrow.  Inpatient Medications    Scheduled Meds:  aspirin  81 mg Oral Daily   bisacodyl  5 mg Oral Once   [START ON 09/26/2021] chlorhexidine  15 mL Mouth/Throat Once   Chlorhexidine Gluconate Cloth  6 each Topical Once   And   Chlorhexidine Gluconate Cloth  6 each Topical Once   [START ON 09/26/2021] epinephrine  0-10 mcg/min Intravenous To OR   ezetimibe  10 mg Oral Daily   [START ON 09/26/2021] heparin-papaverine-plasmalyte irrigation   Irrigation To OR   hydrALAZINE  50 mg Oral Q8H   insulin aspart  0-9 Units Subcutaneous TID AC   [START ON 09/26/2021] insulin   Intravenous To OR   [START ON 09/26/2021] [place] Blood Cardioplegia vial (lidocaine/magnesium/mannitol 0.26g-4g-6.4g)   Intracoronary To OR   metoprolol tartrate  100 mg Oral BID   [START ON 09/26/2021] metoprolol tartrate  12.5 mg Oral Once   [START ON 09/26/2021] phenylephrine  30-200 mcg/min Intravenous To OR   [START ON 09/26/2021] potassium chloride  80 mEq Other To OR   rosuvastatin  10 mg Oral Daily   sodium chloride flush  3 mL Intravenous Q12H   [START ON 09/26/2021] tranexamic acid  15 mg/kg Intravenous To OR   [START ON 09/26/2021] tranexamic acid  2 mg/kg Intracatheter To OR   Continuous Infusions:  sodium chloride     [START ON 09/26/2021]  ceFAZolin (ANCEF) IV     [START ON 09/26/2021]  ceFAZolin (ANCEF) IV     [START ON 09/26/2021] dexmedetomidine     [START ON 09/26/2021] heparin 30,000 units/NS 1000 mL solution for CELLSAVER     heparin 1,650 Units/hr (09/25/21 0529)   [START ON 09/26/2021] milrinone     [START ON 09/26/2021] nitroGLYCERIN     [START ON 09/26/2021] norepinephrine      [START ON 09/26/2021] tranexamic acid (CYKLOKAPRON) infusion ([facility])     [START ON 09/26/2021] vancomycin     PRN Meds: sodium chloride, acetaminophen, LORazepam, nitroGLYCERIN, ondansetron (ZOFRAN) IV, ondansetron **OR** [DISCONTINUED] ondansetron (ZOFRAN) IV, polyethylene glycol, sodium chloride flush, temazepam   Vital Signs    Vitals:   09/24/21 1524 09/24/21 2046 09/25/21 0520 09/25/21 0852  BP: 106/61 129/76  117/69  Pulse:   66 78  Resp:  18 15 18   Temp:  98 F (36.7 C) 97.7 F (36.5 C) 97.8 F (36.6 C)  TempSrc:  Oral Oral Oral  SpO2:  98% 97% 97%  Weight:      Height:        Intake/Output Summary (Last 24 hours) at 09/25/2021 1308 Last data filed at 09/25/2021 0310 Gross per 24 hour  Intake 820.05 ml  Output --  Net 820.05 ml   Filed Weights   09/20/21 1151  Weight: 97.1 kg    Telemetry    Sinus rhythm- Personally Reviewed  ECG    No new EKG today- Personally Reviewed  Physical Exam   GEN: No acute distress.   Neck: No JVD Cardiac: RRR, no murmurs, rubs, or gallops.  Respiratory: Clear to auscultation bilaterally. GI: Soft, nontender, non-distended  MS: No edema; No deformity. Neuro:  Nonfocal  Psych:  Normal affect   Labs    Chemistry Recent Labs  Lab 09/20/21 1145 09/22/21 0756  NA 137 137  K 4.1 4.2  CL 106 106  CO2 26 26  GLUCOSE 184* 143*  BUN 17 12  CREATININE 1.09 1.01  CALCIUM 8.9 9.2  GFRNONAA >60 >60  ANIONGAP 5 5     Hematology Recent Labs  Lab 09/23/21 0211 09/24/21 0413 09/25/21 0303  WBC 7.1 7.6 7.8  RBC 4.81 4.83 4.74  HGB 14.6 14.8 14.3  HCT 43.7 43.8 43.0  MCV 90.9 90.7 90.7  MCH 30.4 30.6 30.2  MCHC 33.4 33.8 33.3  RDW 12.9 13.2 13.2  PLT 156 149* 146*    Cardiac EnzymesNo results for input(s): TROPONINI in the last 168 hours. No results for input(s): TROPIPOC in the last 168 hours.   BNPNo results for input(s): BNP, PROBNP in the last 168 hours.   DDimer No results for input(s): DDIMER in the last 168  hours.   Radiology    No results found.  Cardiac Studies   TTE 09/22/21  1. Left ventricular ejection fraction, by estimation, is 60 to 65%. The  left ventricle has normal function. The left ventricle has no regional  wall motion abnormalities. There is mild left ventricular hypertrophy.  Left ventricular diastolic parameters  are consistent with Grade I diastolic dysfunction (impaired relaxation).   2. Right ventricular systolic function is normal. The right ventricular  size is normal. There is normal pulmonary artery systolic pressure. The  estimated right ventricular systolic pressure is 54.2 mmHg.   3. The mitral valve is normal in structure. Trivial mitral valve  regurgitation.   4. The aortic valve was not well visualized. Aortic valve regurgitation  is not visualized. Aortic valve sclerosis/calcification is present,  without any evidence of aortic stenosis.   5. The inferior vena cava is normal in size with greater than 50%  respiratory variability, suggesting right atrial pressure of 3 mmHg.     LHC 09/22/2021   2nd Mrg lesion is 75% stenosed.   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   Mid LM lesion is 90% stenosed.   The left ventricular systolic function is normal.   LV end diastolic pressure is mildly elevated.   The left ventricular ejection fraction is 50-55% by visual estimate.    Patient Profile     80 y.o. male with history of CAD (nonobstructive CAD by cath in 2017), HTN and HLD who presented to [facility] ED on 09/20/2021 for evaluation of chest pain. Found to have an NSTEMI, here with LM dx pending evaluation for CABG planned for tomorrow   Assessment & Plan    Multivessel CAD Diabetes mellitus Hypertension  Recent left heart cath with evidence of multivessel CAD coronary artery bypass grafting planned for tomorrow with Dr. Kipp Brood.  The patient is aware of this.  He is anxiously waiting his procedure but he is happy to get this over with someone. He has no  questions today when I am in the room with his wife she also does not have any questions. Please continue his medical therapy with his heparin drip, aspirin 81 mg daily, he is on Lopressor 100 mg twice daily and hydralazine 50 mg 3 times daily.  Diabetes to be managed on sliding scale at this time.  Blood pressure is acceptable continue his current medication   For questions or updates, please contact [place] Please consult [email or web address] for contact info under Cardiology/STEMI.      Signed,  Deontrae Drinkard, DO  09/25/2021, 1:08 PM

## 2021-09-25 NOTE — Progress Notes (Addendum)
Paged cardiology concerning use of hydralazine. SBP 113.   1645: Spoke with Rosaria Ferries, NP and verbally advised to change hydralazine to 25mg  every 8 hours due to low blood pressure. Confirmed verbal order for modifying dose by read back to provider.

## 2021-09-25 NOTE — Care Management Important Message (Signed)
Important Message  Patient Details  Name: RAMZEY PETROVIC MRN: [id number] Date of Birth: Apr 09, 1941   Medicare Important Message Given:  Yes     Shelda Altes 09/25/2021, 11:09 AM

## 2021-09-26 ENCOUNTER — Inpatient Hospital Stay (HOSPITAL_COMMUNITY): Payer: Medicare Other | Admitting: Certified Registered"

## 2021-09-26 ENCOUNTER — Inpatient Hospital Stay (HOSPITAL_COMMUNITY)
Admission: EM | Disposition: A | Discharge status: Home / Self Care | Payer: Self-pay | Source: Home / Self Care | Attending: Cardiovascular Disease

## 2021-09-26 ENCOUNTER — Encounter (HOSPITAL_COMMUNITY): Payer: Self-pay | Admitting: Cardiovascular Disease

## 2021-09-26 ENCOUNTER — Inpatient Hospital Stay (HOSPITAL_COMMUNITY): Payer: Medicare Other

## 2021-09-26 DIAGNOSIS — Z9911 Dependence on respirator [ventilator] status: Secondary | ICD-10-CM | POA: Diagnosis not present

## 2021-09-26 DIAGNOSIS — E1151 Type 2 diabetes mellitus with diabetic peripheral angiopathy without gangrene: Secondary | ICD-10-CM

## 2021-09-26 DIAGNOSIS — I200 Unstable angina: Secondary | ICD-10-CM

## 2021-09-26 DIAGNOSIS — J9601 Acute respiratory failure with hypoxia: Secondary | ICD-10-CM

## 2021-09-26 DIAGNOSIS — Z951 Presence of aortocoronary bypass graft: Secondary | ICD-10-CM

## 2021-09-26 DIAGNOSIS — I25110 Atherosclerotic heart disease of native coronary artery with unstable angina pectoris: Secondary | ICD-10-CM

## 2021-09-26 DIAGNOSIS — I214 Non-ST elevation (NSTEMI) myocardial infarction: Secondary | ICD-10-CM

## 2021-09-26 HISTORY — PX: ENDOVEIN HARVEST OF GREATER SAPHENOUS VEIN: SHX5059

## 2021-09-26 HISTORY — PX: CORONARY ARTERY BYPASS GRAFT: SHX141

## 2021-09-26 HISTORY — PX: TEE WITHOUT CARDIOVERSION: SHX5443

## 2021-09-26 LAB — POCT I-STAT EG7
Acid-base deficit: 1 mmol/L (ref 0.0–2.0)
Bicarbonate: 24.4 mmol/L (ref 20.0–28.0)
Calcium, Ion: 1.13 mmol/L — ABNORMAL LOW (ref 1.15–1.40)
HCT: 30 % — ABNORMAL LOW (ref 39.0–52.0)
Hemoglobin: 10.2 g/dL — ABNORMAL LOW (ref 13.0–17.0)
O2 Saturation: 77 %
Potassium: 3.7 mmol/L (ref 3.5–5.1)
Sodium: 141 mmol/L (ref 135–145)
TCO2: 26 mmol/L (ref 22–32)
pCO2, Ven: 40.4 mmHg — ABNORMAL LOW (ref 44.0–60.0)
pH, Ven: 7.389 (ref 7.250–7.430)
pO2, Ven: 42 mmHg (ref 32.0–45.0)

## 2021-09-26 LAB — BASIC METABOLIC PANEL
Anion gap: 7 (ref 5–15)
Anion gap: 7 (ref 5–15)
Anion gap: 7 (ref 5–15)
BUN: 14 mg/dL (ref 8–23)
BUN: 9 mg/dL (ref 8–23)
BUN: 11 mg/dL (ref 8–23)
CO2: 22 mmol/L (ref 22–32)
CO2: 22 mmol/L (ref 22–32)
CO2: 21 mmol/L — ABNORMAL LOW (ref 22–32)
Calcium: 9 mg/dL (ref 8.9–10.3)
Calcium: 8.4 mg/dL — ABNORMAL LOW (ref 8.9–10.3)
Calcium: 8.4 mg/dL — ABNORMAL LOW (ref 8.9–10.3)
Chloride: 108 mmol/L (ref 98–111)
Chloride: 106 mmol/L (ref 98–111)
Chloride: 107 mmol/L (ref 98–111)
Creatinine, Ser: 1.08 mg/dL (ref 0.61–1.24)
Creatinine, Ser: 0.97 mg/dL (ref 0.61–1.24)
Creatinine, Ser: 0.98 mg/dL (ref 0.61–1.24)
GFR, Estimated: >60 mL/min (ref >60)
GFR, Estimated: >60 mL/min (ref >60)
GFR, Estimated: >60 mL/min (ref >60)
Glucose, Bld: 146 mg/dL — ABNORMAL HIGH (ref 70–99)
Glucose, Bld: 161 mg/dL — ABNORMAL HIGH (ref 70–99)
Glucose, Bld: 161 mg/dL — ABNORMAL HIGH (ref 70–99)
Potassium: 3.7 mmol/L (ref 3.5–5.1)
Potassium: 3.9 mmol/L (ref 3.5–5.1)
Potassium: 3.8 mmol/L (ref 3.5–5.1)
Sodium: 137 mmol/L (ref 135–145)
Sodium: 135 mmol/L (ref 135–145)
Sodium: 135 mmol/L (ref 135–145)

## 2021-09-26 LAB — POCT I-STAT 7, (LYTES, BLD GAS, ICA,H+H)
Acid-Base Excess: 1 mmol/L (ref 0.0–2.0)
Acid-Base Excess: 1 mmol/L (ref 0.0–2.0)
Acid-Base Excess: 1 mmol/L (ref 0.0–2.0)
Acid-base deficit: 1 mmol/L (ref 0.0–2.0)
Acid-base deficit: 3 mmol/L — ABNORMAL HIGH (ref 0.0–2.0)
Acid-base deficit: 5 mmol/L — ABNORMAL HIGH (ref 0.0–2.0)
Bicarbonate: 20.9 mmol/L (ref 20.0–28.0)
Bicarbonate: 22.8 mmol/L (ref 20.0–28.0)
Bicarbonate: 23.9 mmol/L (ref 20.0–28.0)
Bicarbonate: 25.2 mmol/L (ref 20.0–28.0)
Bicarbonate: 25.3 mmol/L (ref 20.0–28.0)
Bicarbonate: 25.9 mmol/L (ref 20.0–28.0)
Calcium, Ion: 1.05 mmol/L — ABNORMAL LOW (ref 1.15–1.40)
Calcium, Ion: 1.12 mmol/L — ABNORMAL LOW (ref 1.15–1.40)
Calcium, Ion: 1.15 mmol/L (ref 1.15–1.40)
Calcium, Ion: 1.26 mmol/L (ref 1.15–1.40)
Calcium, Ion: 1.34 mmol/L (ref 1.15–1.40)
Calcium, Ion: 1.4 mmol/L (ref 1.15–1.40)
HCT: 27 % — ABNORMAL LOW (ref 39.0–52.0)
HCT: 29 % — ABNORMAL LOW (ref 39.0–52.0)
HCT: 29 % — ABNORMAL LOW (ref 39.0–52.0)
HCT: 31 % — ABNORMAL LOW (ref 39.0–52.0)
HCT: 34 % — ABNORMAL LOW (ref 39.0–52.0)
HCT: 35 % — ABNORMAL LOW (ref 39.0–52.0)
Hemoglobin: 10.5 g/dL — ABNORMAL LOW (ref 13.0–17.0)
Hemoglobin: 11.6 g/dL — ABNORMAL LOW (ref 13.0–17.0)
Hemoglobin: 11.9 g/dL — ABNORMAL LOW (ref 13.0–17.0)
Hemoglobin: 9.2 g/dL — ABNORMAL LOW (ref 13.0–17.0)
Hemoglobin: 9.9 g/dL — ABNORMAL LOW (ref 13.0–17.0)
Hemoglobin: 9.9 g/dL — ABNORMAL LOW (ref 13.0–17.0)
O2 Saturation: 100 %
O2 Saturation: 100 %
O2 Saturation: 100 %
O2 Saturation: 97 %
O2 Saturation: 98 %
O2 Saturation: 99 %
Patient temperature: 35.8
Patient temperature: 36.5
Potassium: 3.8 mmol/L (ref 3.5–5.1)
Potassium: 4.1 mmol/L (ref 3.5–5.1)
Potassium: 4.2 mmol/L (ref 3.5–5.1)
Potassium: 4.4 mmol/L (ref 3.5–5.1)
Potassium: 4.7 mmol/L (ref 3.5–5.1)
Potassium: 5 mmol/L (ref 3.5–5.1)
Sodium: 136 mmol/L (ref 135–145)
Sodium: 138 mmol/L (ref 135–145)
Sodium: 138 mmol/L (ref 135–145)
Sodium: 139 mmol/L (ref 135–145)
Sodium: 140 mmol/L (ref 135–145)
Sodium: 140 mmol/L (ref 135–145)
TCO2: 22 mmol/L (ref 22–32)
TCO2: 24 mmol/L (ref 22–32)
TCO2: 25 mmol/L (ref 22–32)
TCO2: 26 mmol/L (ref 22–32)
TCO2: 26 mmol/L (ref 22–32)
TCO2: 27 mmol/L (ref 22–32)
pCO2 arterial: 35.8 mmHg (ref 32.0–48.0)
pCO2 arterial: 36.5 mmHg (ref 32.0–48.0)
pCO2 arterial: 39.4 mmHg (ref 32.0–48.0)
pCO2 arterial: 40.2 mmHg (ref 32.0–48.0)
pCO2 arterial: 40.8 mmHg (ref 32.0–48.0)
pCO2 arterial: 42.1 mmHg (ref 32.0–48.0)
pH, Arterial: 7.33 — ABNORMAL LOW (ref 7.350–7.450)
pH, Arterial: 7.337 — ABNORMAL LOW (ref 7.350–7.450)
pH, Arterial: 7.375 (ref 7.350–7.450)
pH, Arterial: 7.417 (ref 7.350–7.450)
pH, Arterial: 7.45 (ref 7.350–7.450)
pH, Arterial: 7.454 — ABNORMAL HIGH (ref 7.350–7.450)
pO2, Arterial: 103 mmHg (ref 83.0–108.0)
pO2, Arterial: 145 mmHg — ABNORMAL HIGH (ref 83.0–108.0)
pO2, Arterial: 332 mmHg — ABNORMAL HIGH (ref 83.0–108.0)
pO2, Arterial: 371 mmHg — ABNORMAL HIGH (ref 83.0–108.0)
pO2, Arterial: 389 mmHg — ABNORMAL HIGH (ref 83.0–108.0)
pO2, Arterial: 94 mmHg (ref 83.0–108.0)

## 2021-09-26 LAB — CBC
HCT: 43.5 % (ref 39.0–52.0)
HCT: 36.2 % — ABNORMAL LOW (ref 39.0–52.0)
HCT: 35.9 % — ABNORMAL LOW (ref 39.0–52.0)
Hemoglobin: 14.7 g/dL (ref 13.0–17.0)
Hemoglobin: 12.4 g/dL — ABNORMAL LOW (ref 13.0–17.0)
Hemoglobin: 12.4 g/dL — ABNORMAL LOW (ref 13.0–17.0)
MCH: 30.7 pg (ref 26.0–34.0)
MCH: 31.2 pg (ref 26.0–34.0)
MCH: 31.1 pg (ref 26.0–34.0)
MCHC: 33.8 g/dL (ref 30.0–36.0)
MCHC: 34.3 g/dL (ref 30.0–36.0)
MCHC: 34.5 g/dL (ref 30.0–36.0)
MCV: 90.8 fL (ref 80.0–100.0)
MCV: 91.2 fL (ref 80.0–100.0)
MCV: 90 fL (ref 80.0–100.0)
Platelets: 161 10*3/uL (ref 150–400)
Platelets: 121 10*3/uL — ABNORMAL LOW (ref 150–400)
Platelets: 130 10*3/uL — ABNORMAL LOW (ref 150–400)
RBC: 4.79 MIL/uL (ref 4.22–5.81)
RBC: 3.97 MIL/uL — ABNORMAL LOW (ref 4.22–5.81)
RBC: 3.99 MIL/uL — ABNORMAL LOW (ref 4.22–5.81)
RDW: 13.2 % (ref 11.5–15.5)
RDW: 13.1 % (ref 11.5–15.5)
RDW: 12.9 % (ref 11.5–15.5)
WBC: 8.3 10*3/uL (ref 4.0–10.5)
WBC: 18.3 10*3/uL — ABNORMAL HIGH (ref 4.0–10.5)
WBC: 17.3 10*3/uL — ABNORMAL HIGH (ref 4.0–10.5)
nRBC: 0 % (ref 0.0–0.2)
nRBC: 0 % (ref 0.0–0.2)
nRBC: 0 % (ref 0.0–0.2)

## 2021-09-26 LAB — POCT I-STAT, CHEM 8
BUN: 13 mg/dL (ref 8–23)
BUN: 12 mg/dL (ref 8–23)
BUN: 12 mg/dL (ref 8–23)
BUN: 11 mg/dL (ref 8–23)
Calcium, Ion: 1.31 mmol/L (ref 1.15–1.40)
Calcium, Ion: 1.26 mmol/L (ref 1.15–1.40)
Calcium, Ion: 1.12 mmol/L — ABNORMAL LOW (ref 1.15–1.40)
Calcium, Ion: 1.4 mmol/L (ref 1.15–1.40)
Chloride: 103 mmol/L (ref 98–111)
Chloride: 104 mmol/L (ref 98–111)
Chloride: 105 mmol/L (ref 98–111)
Chloride: 104 mmol/L (ref 98–111)
Creatinine, Ser: 0.9 mg/dL (ref 0.61–1.24)
Creatinine, Ser: 0.9 mg/dL (ref 0.61–1.24)
Creatinine, Ser: 0.8 mg/dL (ref 0.61–1.24)
Creatinine, Ser: 0.8 mg/dL (ref 0.61–1.24)
Glucose, Bld: 130 mg/dL — ABNORMAL HIGH (ref 70–99)
Glucose, Bld: 133 mg/dL — ABNORMAL HIGH (ref 70–99)
Glucose, Bld: 119 mg/dL — ABNORMAL HIGH (ref 70–99)
Glucose, Bld: 138 mg/dL — ABNORMAL HIGH (ref 70–99)
HCT: 40 % (ref 39.0–52.0)
HCT: 37 % — ABNORMAL LOW (ref 39.0–52.0)
HCT: 29 % — ABNORMAL LOW (ref 39.0–52.0)
HCT: 32 % — ABNORMAL LOW (ref 39.0–52.0)
Hemoglobin: 13.6 g/dL (ref 13.0–17.0)
Hemoglobin: 12.6 g/dL — ABNORMAL LOW (ref 13.0–17.0)
Hemoglobin: 9.9 g/dL — ABNORMAL LOW (ref 13.0–17.0)
Hemoglobin: 10.9 g/dL — ABNORMAL LOW (ref 13.0–17.0)
Potassium: 3.8 mmol/L (ref 3.5–5.1)
Potassium: 4 mmol/L (ref 3.5–5.1)
Potassium: 4.9 mmol/L (ref 3.5–5.1)
Potassium: 4.4 mmol/L (ref 3.5–5.1)
Sodium: 141 mmol/L (ref 135–145)
Sodium: 139 mmol/L (ref 135–145)
Sodium: 138 mmol/L (ref 135–145)
Sodium: 138 mmol/L (ref 135–145)
TCO2: 28 mmol/L (ref 22–32)
TCO2: 24 mmol/L (ref 22–32)
TCO2: 27 mmol/L (ref 22–32)
TCO2: 23 mmol/L (ref 22–32)

## 2021-09-26 LAB — ECHO INTRAOPERATIVE TEE
Height: 69 in
Weight: 3376 oz

## 2021-09-26 LAB — URINALYSIS, ROUTINE W REFLEX MICROSCOPIC
Bilirubin Urine: NEGATIVE
Glucose, UA: NEGATIVE mg/dL
Hgb urine dipstick: NEGATIVE
Ketones, ur: NEGATIVE mg/dL
Leukocytes,Ua: NEGATIVE
Nitrite: NEGATIVE
Protein, ur: NEGATIVE mg/dL
Specific Gravity, Urine: >1.03 — ABNORMAL HIGH (ref 1.005–1.030)
pH: 5.5 (ref 5.0–8.0)

## 2021-09-26 LAB — GLUCOSE, CAPILLARY
Glucose-Capillary: 127 mg/dL — ABNORMAL HIGH (ref 70–99)
Glucose-Capillary: 160 mg/dL — ABNORMAL HIGH (ref 70–99)
Glucose-Capillary: 151 mg/dL — ABNORMAL HIGH (ref 70–99)
Glucose-Capillary: 156 mg/dL — ABNORMAL HIGH (ref 70–99)
Glucose-Capillary: 159 mg/dL — ABNORMAL HIGH (ref 70–99)
Glucose-Capillary: 155 mg/dL — ABNORMAL HIGH (ref 70–99)
Glucose-Capillary: 148 mg/dL — ABNORMAL HIGH (ref 70–99)
Glucose-Capillary: 139 mg/dL — ABNORMAL HIGH (ref 70–99)
Glucose-Capillary: 175 mg/dL — ABNORMAL HIGH (ref 70–99)

## 2021-09-26 LAB — HEMOGLOBIN AND HEMATOCRIT, BLOOD
HCT: 30.6 % — ABNORMAL LOW (ref 39.0–52.0)
Hemoglobin: 10.6 g/dL — ABNORMAL LOW (ref 13.0–17.0)

## 2021-09-26 LAB — MAGNESIUM
Magnesium: 2.7 mg/dL — ABNORMAL HIGH (ref 1.7–2.4)
Magnesium: 2.8 mg/dL — ABNORMAL HIGH (ref 1.7–2.4)

## 2021-09-26 LAB — HEPARIN LEVEL (UNFRACTIONATED): Heparin Unfractionated: 0.34 IU/mL (ref 0.30–0.70)

## 2021-09-26 LAB — PLATELET COUNT: Platelets: 136 10*3/uL — ABNORMAL LOW (ref 150–400)

## 2021-09-26 LAB — APTT: aPTT: 35 seconds (ref 24–36)

## 2021-09-26 LAB — PROTIME-INR
INR: 1.3 — ABNORMAL HIGH (ref 0.8–1.2)
Prothrombin Time: 15.9 seconds — ABNORMAL HIGH (ref 11.4–15.2)

## 2021-09-26 SURGERY — CORONARY ARTERY BYPASS GRAFTING (CABG)
Anesthesia: General | Site: Chest | Laterality: Right

## 2021-09-26 MED ORDER — METOPROLOL TARTRATE 5 MG/5ML IV SOLN: 2.5000 mg | INTRAVENOUS | Status: DC | PRN

## 2021-09-26 MED ORDER — MIDAZOLAM HCL 2 MG/2ML IJ SOLN
INTRAMUSCULAR | Status: AC
  Administered 2021-09-26: 1 mg via INTRAVENOUS
  Filled 2021-09-26: qty 2

## 2021-09-26 MED ORDER — BISACODYL 10 MG RE SUPP: 10.0000 mg | Freq: Every day | RECTAL | Status: DC

## 2021-09-26 MED ORDER — SODIUM CHLORIDE 0.9% FLUSH
10.0000 mL | Freq: Two times a day (BID) | INTRAVENOUS | Status: DC
  Administered 2021-09-27 – 2021-10-01 (×6): 10 mL

## 2021-09-26 MED ORDER — BISACODYL 5 MG PO TBEC
10.0000 mg | DELAYED_RELEASE_TABLET | Freq: Every day | ORAL | Status: DC
  Administered 2021-09-27 – 2021-09-28 (×2): 10 mg via ORAL
  Filled 2021-09-26 (×4): qty 2

## 2021-09-26 MED ORDER — SODIUM CHLORIDE 0.9% FLUSH: 10.0000 mL | INTRAVENOUS | Status: DC | PRN

## 2021-09-26 MED ORDER — NOREPINEPHRINE 4 MG/250ML-% IV SOLN: 0.0000 ug/min | INTRAVENOUS | Status: DC

## 2021-09-26 MED ORDER — AMIODARONE HCL IN DEXTROSE 360-4.14 MG/200ML-% IV SOLN
INTRAVENOUS | Status: DC | PRN
  Administered 2021-09-26: 60 mg/h via INTRAVENOUS

## 2021-09-26 MED ORDER — AMIODARONE HCL IN DEXTROSE 360-4.14 MG/200ML-% IV SOLN
60.0000 mg/h | INTRAVENOUS | Status: AC
  Administered 2021-09-26: 60 mg/h via INTRAVENOUS
  Filled 2021-09-26: qty 200

## 2021-09-26 MED ORDER — FENTANYL CITRATE (PF) 250 MCG/5ML IJ SOLN
INTRAMUSCULAR | Status: AC
  Filled 2021-09-26: qty 5

## 2021-09-26 MED ORDER — DEXAMETHASONE SODIUM PHOSPHATE 10 MG/ML IJ SOLN
INTRAMUSCULAR | Status: DC | PRN
  Administered 2021-09-26: 10 mg via INTRAVENOUS

## 2021-09-26 MED ORDER — PROPOFOL 10 MG/ML IV BOLUS
INTRAVENOUS | Status: AC
  Filled 2021-09-26: qty 20

## 2021-09-26 MED ORDER — SODIUM CHLORIDE (PF) 0.9 % IJ SOLN
OROMUCOSAL | Status: DC | PRN
  Administered 2021-09-26 (×2): 4 mL via TOPICAL

## 2021-09-26 MED ORDER — FENTANYL CITRATE (PF) 250 MCG/5ML IJ SOLN
INTRAMUSCULAR | Status: DC | PRN
  Administered 2021-09-26: 150 ug via INTRAVENOUS
  Administered 2021-09-26: 100 ug via INTRAVENOUS
  Administered 2021-09-26: 400 ug via INTRAVENOUS
  Administered 2021-09-26: 250 ug via INTRAVENOUS
  Administered 2021-09-26: 100 ug via INTRAVENOUS

## 2021-09-26 MED ORDER — OXYCODONE HCL 5 MG PO TABS
5.0000 mg | ORAL_TABLET | ORAL | Status: DC | PRN
  Administered 2021-09-27 (×2): 10 mg via ORAL
  Filled 2021-09-26 (×2): qty 2

## 2021-09-26 MED ORDER — POTASSIUM CHLORIDE 10 MEQ/50ML IV SOLN: 10.0000 meq | INTRAVENOUS | Status: AC

## 2021-09-26 MED ORDER — LACTATED RINGERS IV SOLN: INTRAVENOUS | Status: DC

## 2021-09-26 MED ORDER — ACETAMINOPHEN 160 MG/5ML PO SOLN: 650.0000 mg | Freq: Once | ORAL | Status: AC

## 2021-09-26 MED ORDER — FAMOTIDINE IN NACL 20-0.9 MG/50ML-% IV SOLN
20.0000 mg | Freq: Two times a day (BID) | INTRAVENOUS | Status: AC
  Administered 2021-09-26 (×2): 20 mg via INTRAVENOUS
  Filled 2021-09-26 (×2): qty 50

## 2021-09-26 MED ORDER — CHLORHEXIDINE GLUCONATE CLOTH 2 % EX PADS
6.0000 | MEDICATED_PAD | Freq: Every day | CUTANEOUS | Status: DC
  Administered 2021-09-26 – 2021-09-29 (×4): 6 via TOPICAL

## 2021-09-26 MED ORDER — MIDAZOLAM HCL 2 MG/2ML IJ SOLN: 1.0000 mg | Freq: Once | INTRAMUSCULAR | Status: AC

## 2021-09-26 MED ORDER — MAGNESIUM SULFATE 4 GM/100ML IV SOLN
4.0000 g | Freq: Once | INTRAVENOUS | Status: AC
  Administered 2021-09-26: 4 g via INTRAVENOUS
  Filled 2021-09-26: qty 100

## 2021-09-26 MED ORDER — SODIUM CHLORIDE 0.9% FLUSH: 3.0000 mL | INTRAVENOUS | Status: DC | PRN

## 2021-09-26 MED ORDER — MIDAZOLAM HCL 2 MG/2ML IJ SOLN
INTRAMUSCULAR | Status: AC
  Filled 2021-09-26: qty 2

## 2021-09-26 MED ORDER — VANCOMYCIN HCL IN DEXTROSE 1-5 GM/200ML-% IV SOLN
1000.0000 mg | Freq: Once | INTRAVENOUS | Status: AC
  Administered 2021-09-26: 1000 mg via INTRAVENOUS
  Filled 2021-09-26: qty 200

## 2021-09-26 MED ORDER — CHLORHEXIDINE GLUCONATE 0.12 % MT SOLN
15.0000 mL | OROMUCOSAL | Status: AC
  Administered 2021-09-26: 15 mL via OROMUCOSAL

## 2021-09-26 MED ORDER — DOCUSATE SODIUM 100 MG PO CAPS
200.0000 mg | ORAL_CAPSULE | Freq: Every day | ORAL | Status: DC
  Administered 2021-09-27 – 2021-09-28 (×2): 200 mg via ORAL
  Filled 2021-09-26 (×5): qty 2

## 2021-09-26 MED ORDER — PANTOPRAZOLE SODIUM 40 MG PO TBEC
40.0000 mg | DELAYED_RELEASE_TABLET | Freq: Every day | ORAL | Status: DC
  Administered 2021-09-28 – 2021-10-01 (×4): 40 mg via ORAL
  Filled 2021-09-26 (×4): qty 1

## 2021-09-26 MED ORDER — ALBUMIN HUMAN 5 % IV SOLN: INTRAVENOUS | Status: DC | PRN

## 2021-09-26 MED ORDER — SODIUM CHLORIDE 0.9 % IV SOLN: INTRAVENOUS | Status: DC

## 2021-09-26 MED ORDER — ONDANSETRON HCL 4 MG/2ML IJ SOLN
4.0000 mg | Freq: Four times a day (QID) | INTRAMUSCULAR | Status: DC | PRN
  Administered 2021-09-27 – 2021-09-29 (×5): 4 mg via INTRAVENOUS
  Filled 2021-09-26 (×8): qty 2

## 2021-09-26 MED ORDER — MIDAZOLAM HCL 2 MG/2ML IJ SOLN: 2.0000 mg | INTRAMUSCULAR | Status: DC | PRN

## 2021-09-26 MED ORDER — PROTAMINE SULFATE 10 MG/ML IV SOLN
INTRAVENOUS | Status: DC | PRN
  Administered 2021-09-26: 30 mg via INTRAVENOUS
  Administered 2021-09-26: 100 mg via INTRAVENOUS
  Administered 2021-09-26 (×2): 50 mg via INTRAVENOUS
  Administered 2021-09-26: 70 mg via INTRAVENOUS

## 2021-09-26 MED ORDER — CEFAZOLIN SODIUM-DEXTROSE 2-4 GM/100ML-% IV SOLN
2.0000 g | Freq: Three times a day (TID) | INTRAVENOUS | Status: AC
  Administered 2021-09-26 – 2021-09-28 (×6): 2 g via INTRAVENOUS
  Filled 2021-09-26 (×6): qty 100

## 2021-09-26 MED ORDER — METOPROLOL TARTRATE 25 MG/10 ML ORAL SUSPENSION: 12.5000 mg | Freq: Two times a day (BID) | ORAL | Status: DC

## 2021-09-26 MED ORDER — TRAMADOL HCL 50 MG PO TABS
50.0000 mg | ORAL_TABLET | ORAL | Status: DC | PRN
  Administered 2021-09-27: 100 mg via ORAL
  Filled 2021-09-26: qty 2

## 2021-09-26 MED ORDER — ORAL CARE MOUTH RINSE
15.0000 mL | OROMUCOSAL | Status: DC
  Administered 2021-09-26 – 2021-09-28 (×13): 15 mL via OROMUCOSAL

## 2021-09-26 MED ORDER — PLASMA-LYTE A IV SOLN
INTRAVENOUS | Status: DC | PRN
  Administered 2021-09-26: 1000 mL via INTRAVASCULAR

## 2021-09-26 MED ORDER — MIDAZOLAM HCL 5 MG/5ML IJ SOLN
INTRAMUSCULAR | Status: DC | PRN
  Administered 2021-09-26 (×3): 2 mg via INTRAVENOUS
  Administered 2021-09-26: 1 mg via INTRAVENOUS

## 2021-09-26 MED ORDER — PHENYLEPHRINE 40 MCG/ML (10ML) SYRINGE FOR IV PUSH (FOR BLOOD PRESSURE SUPPORT)
PREFILLED_SYRINGE | INTRAVENOUS | Status: AC
  Filled 2021-09-26: qty 10

## 2021-09-26 MED ORDER — LACTATED RINGERS IV SOLN: 500.0000 mL | Freq: Once | INTRAVENOUS | Status: DC | PRN

## 2021-09-26 MED ORDER — METOPROLOL TARTRATE 12.5 MG HALF TABLET
12.5000 mg | ORAL_TABLET | Freq: Two times a day (BID) | ORAL | Status: DC
  Administered 2021-09-27 – 2021-10-01 (×8): 12.5 mg via ORAL
  Filled 2021-09-26 (×9): qty 1

## 2021-09-26 MED ORDER — HEPARIN SODIUM (PORCINE) 1000 UNIT/ML IJ SOLN
INTRAMUSCULAR | Status: DC | PRN
  Administered 2021-09-26: 30000 [IU] via INTRAVENOUS

## 2021-09-26 MED ORDER — CHLORHEXIDINE GLUCONATE 0.12% ORAL RINSE (MEDLINE KIT)
15.0000 mL | Freq: Two times a day (BID) | OROMUCOSAL | Status: DC
  Administered 2021-09-26 – 2021-10-01 (×8): 15 mL via OROMUCOSAL

## 2021-09-26 MED ORDER — ACETAMINOPHEN 650 MG RE SUPP
650.0000 mg | Freq: Once | RECTAL | Status: AC
  Administered 2021-09-26: 650 mg via RECTAL

## 2021-09-26 MED ORDER — PHENYLEPHRINE HCL-NACL 20-0.9 MG/250ML-% IV SOLN
0.0000 ug/min | INTRAVENOUS | Status: DC
  Administered 2021-09-27: 20 ug/min via INTRAVENOUS
  Filled 2021-09-26: qty 250

## 2021-09-26 MED ORDER — PROPOFOL 10 MG/ML IV BOLUS
INTRAVENOUS | Status: DC | PRN
  Administered 2021-09-26: 30 mg via INTRAVENOUS

## 2021-09-26 MED ORDER — MIDAZOLAM HCL (PF) 10 MG/2ML IJ SOLN
INTRAMUSCULAR | Status: AC
  Filled 2021-09-26: qty 2

## 2021-09-26 MED ORDER — ROSUVASTATIN CALCIUM 20 MG PO TABS
20.0000 mg | ORAL_TABLET | Freq: Every day | ORAL | Status: DC
  Administered 2021-09-27 – 2021-10-01 (×5): 20 mg via ORAL
  Filled 2021-09-26 (×5): qty 1

## 2021-09-26 MED ORDER — DEXMEDETOMIDINE HCL IN NACL 400 MCG/100ML IV SOLN: 0.0000 ug/kg/h | INTRAVENOUS | Status: DC

## 2021-09-26 MED ORDER — ROCURONIUM BROMIDE 10 MG/ML (PF) SYRINGE
PREFILLED_SYRINGE | INTRAVENOUS | Status: DC | PRN
  Administered 2021-09-26: 70 mg via INTRAVENOUS
  Administered 2021-09-26: 30 mg via INTRAVENOUS
  Administered 2021-09-26: 50 mg via INTRAVENOUS

## 2021-09-26 MED ORDER — SODIUM CHLORIDE 0.9% FLUSH
3.0000 mL | Freq: Two times a day (BID) | INTRAVENOUS | Status: DC
  Administered 2021-09-27 – 2021-09-28 (×4): 3 mL via INTRAVENOUS

## 2021-09-26 MED ORDER — CEFAZOLIN SODIUM-DEXTROSE 2-3 GM-%(50ML) IV SOLR
INTRAVENOUS | Status: DC | PRN
  Administered 2021-09-26 (×2): 2 g via INTRAVENOUS

## 2021-09-26 MED ORDER — EPHEDRINE SULFATE-NACL 50-0.9 MG/10ML-% IV SOSY
PREFILLED_SYRINGE | INTRAVENOUS | Status: DC | PRN
  Administered 2021-09-26: 10 mg via INTRAVENOUS

## 2021-09-26 MED ORDER — MORPHINE SULFATE (PF) 2 MG/ML IV SOLN
1.0000 mg | INTRAVENOUS | Status: DC | PRN
  Administered 2021-09-27: 2 mg via INTRAVENOUS
  Administered 2021-09-27: 4 mg via INTRAVENOUS
  Filled 2021-09-26: qty 1
  Filled 2021-09-26: qty 2

## 2021-09-26 MED ORDER — SODIUM CHLORIDE 0.45 % IV SOLN: INTRAVENOUS | Status: DC | PRN

## 2021-09-26 MED ORDER — ASPIRIN 81 MG PO CHEW: 324.0000 mg | CHEWABLE_TABLET | Freq: Every day | ORAL | Status: DC

## 2021-09-26 MED ORDER — FENTANYL CITRATE (PF) 100 MCG/2ML IJ SOLN
INTRAMUSCULAR | Status: AC
  Administered 2021-09-26: 50 ug via INTRAVENOUS
  Filled 2021-09-26: qty 2

## 2021-09-26 MED ORDER — ALBUMIN HUMAN 5 % IV SOLN
250.0000 mL | INTRAVENOUS | Status: AC | PRN
  Administered 2021-09-26 (×3): 12.5 g via INTRAVENOUS
  Filled 2021-09-26 (×2): qty 250

## 2021-09-26 MED ORDER — EPHEDRINE SULFATE 50 MG/ML IJ SOLN
INTRAMUSCULAR | Status: DC | PRN
  Administered 2021-09-26: 5 mg via INTRAVENOUS

## 2021-09-26 MED ORDER — ASPIRIN EC 325 MG PO TBEC
325.0000 mg | DELAYED_RELEASE_TABLET | Freq: Every day | ORAL | Status: DC
  Administered 2021-09-27: 325 mg via ORAL
  Filled 2021-09-26: qty 1

## 2021-09-26 MED ORDER — SODIUM CHLORIDE 0.9 % IV SOLN: 250.0000 mL | INTRAVENOUS | Status: DC

## 2021-09-26 MED ORDER — AMIODARONE HCL IN DEXTROSE 360-4.14 MG/200ML-% IV SOLN
30.0000 mg/h | INTRAVENOUS | Status: DC
  Administered 2021-09-26 – 2021-09-27 (×2): 30 mg/h via INTRAVENOUS
  Filled 2021-09-26: qty 200

## 2021-09-26 MED ORDER — FENTANYL CITRATE (PF) 100 MCG/2ML IJ SOLN: 50.0000 ug | Freq: Once | INTRAMUSCULAR | Status: AC

## 2021-09-26 MED ORDER — DEXTROSE 50 % IV SOLN: 0.0000 mL | INTRAVENOUS | Status: DC | PRN

## 2021-09-26 MED ORDER — NITROGLYCERIN IN D5W 200-5 MCG/ML-% IV SOLN: 0.0000 ug/min | INTRAVENOUS | Status: DC

## 2021-09-26 MED ORDER — LIDOCAINE 2% (20 MG/ML) 5 ML SYRINGE
INTRAMUSCULAR | Status: DC | PRN
  Administered 2021-09-26: 20 mg via INTRAVENOUS

## 2021-09-26 MED ORDER — ACETAMINOPHEN 160 MG/5ML PO SOLN: 1000.0000 mg | Freq: Four times a day (QID) | ORAL | Status: DC

## 2021-09-26 MED ORDER — 0.9 % SODIUM CHLORIDE (POUR BTL) OPTIME
TOPICAL | Status: DC | PRN
  Administered 2021-09-26: 5000 mL

## 2021-09-26 MED ORDER — INSULIN REGULAR(HUMAN) IN NACL 100-0.9 UT/100ML-% IV SOLN
INTRAVENOUS | Status: DC
  Administered 2021-09-27: 2.4 [IU]/h via INTRAVENOUS
  Filled 2021-09-26: qty 100

## 2021-09-26 MED ORDER — PHENYLEPHRINE HCL (PRESSORS) 10 MG/ML IV SOLN
INTRAVENOUS | Status: DC | PRN
  Administered 2021-09-26: 80 ug via INTRAVENOUS

## 2021-09-26 MED ORDER — ACETAMINOPHEN 500 MG PO TABS
1000.0000 mg | ORAL_TABLET | Freq: Four times a day (QID) | ORAL | Status: DC
  Administered 2021-09-27 – 2021-10-01 (×15): 1000 mg via ORAL
  Filled 2021-09-26 (×16): qty 2

## 2021-09-26 SURGICAL SUPPLY — 93 items
ADH SKN CLS APL DERMABOND .7 (GAUZE/BANDAGES/DRESSINGS) ×4
BAG DECANTER FOR FLEXI CONT (MISCELLANEOUS) ×5 IMPLANT
BLADE CLIPPER SURG (BLADE) ×5 IMPLANT
BLADE STERNUM SYSTEM 6 (BLADE) ×5 IMPLANT
BNDG CMPR MED 10X6 ELC LF (GAUZE/BANDAGES/DRESSINGS) ×4
BNDG ELASTIC 4X5.8 VLCR STR LF (GAUZE/BANDAGES/DRESSINGS) ×5 IMPLANT
BNDG ELASTIC 6X10 VLCR STRL LF (GAUZE/BANDAGES/DRESSINGS) ×2 IMPLANT
BNDG ELASTIC 6X5.8 VLCR STR LF (GAUZE/BANDAGES/DRESSINGS) ×5 IMPLANT
BNDG GAUZE ELAST 4 BULKY (GAUZE/BANDAGES/DRESSINGS) ×5 IMPLANT
CABLE SURGICAL S-101-97-12 (CABLE) ×5 IMPLANT
CANISTER SUCT 3000ML PPV (MISCELLANEOUS) ×5 IMPLANT
CANNULA MC2 2 STG 29/37 NON-V (CANNULA) ×4 IMPLANT
CANNULA MC2 TWO STAGE (CANNULA) ×5
CANNULA NON VENT 20FR 12 (CANNULA) ×5 IMPLANT
CATH ROBINSON RED A/P 18FR (CATHETERS) ×10 IMPLANT
CLIP RETRACTION 3.0MM CORONARY (MISCELLANEOUS) ×5 IMPLANT
CLIP VESOCCLUDE MED 24/CT (CLIP) IMPLANT
CLIP VESOCCLUDE SM WIDE 24/CT (CLIP) IMPLANT
CONN ST 1/2X1/2  BEN (MISCELLANEOUS) ×5
CONN ST 1/2X1/2 BEN (MISCELLANEOUS) ×4 IMPLANT
CONNECTOR BLAKE 2:1 CARIO BLK (MISCELLANEOUS) ×5 IMPLANT
CONTAINER PROTECT SURGISLUSH (MISCELLANEOUS) ×12 IMPLANT
DERMABOND ADVANCED (GAUZE/BANDAGES/DRESSINGS) ×1
DERMABOND ADVANCED .7 DNX12 (GAUZE/BANDAGES/DRESSINGS) IMPLANT
DRAIN CHANNEL 19F RND (DRAIN) ×15 IMPLANT
DRAIN CONNECTOR BLAKE 1:1 (MISCELLANEOUS) ×5 IMPLANT
DRAPE CARDIOVASCULAR INCISE (DRAPES) ×5
DRAPE INCISE IOBAN 66X45 STRL (DRAPES) IMPLANT
DRAPE SRG 135X102X78XABS (DRAPES) ×4 IMPLANT
DRAPE WARM FLUID 44X44 (DRAPES) ×5 IMPLANT
DRSG AQUACEL AG ADV 3.5X10 (GAUZE/BANDAGES/DRESSINGS) ×5 IMPLANT
DRSG COVADERM 4X10 (GAUZE/BANDAGES/DRESSINGS) ×1 IMPLANT
DRSG COVADERM 4X14 (GAUZE/BANDAGES/DRESSINGS) ×5 IMPLANT
ELECT BLADE 4.0 EZ CLEAN MEGAD (MISCELLANEOUS) ×5
ELECT REM PT RETURN 9FT ADLT (ELECTROSURGICAL) ×10
ELECTRODE BLDE 4.0 EZ CLN MEGD (MISCELLANEOUS) ×4 IMPLANT
ELECTRODE REM PT RTRN 9FT ADLT (ELECTROSURGICAL) ×8 IMPLANT
FELT TEFLON 1X6 (MISCELLANEOUS) ×10 IMPLANT
GAUZE 4X4 16PLY ~~LOC~~+RFID DBL (SPONGE) ×1 IMPLANT
GAUZE SPONGE 4X4 12PLY STRL (GAUZE/BANDAGES/DRESSINGS) ×10 IMPLANT
GLOVE SURG ENC MOIS LTX SZ7 (GLOVE) ×10 IMPLANT
GLOVE SURG ENC TEXT LTX SZ7.5 (GLOVE) ×10 IMPLANT
GLOVE SURG MICRO LTX SZ7 (GLOVE) ×2 IMPLANT
GLOVE SURG POLYISO LF SZ7.5 (GLOVE) ×2 IMPLANT
GLOVE SURG SYN 7.5  E (GLOVE) ×10
GLOVE SURG SYN 7.5 E (GLOVE) ×8 IMPLANT
GLOVE SURG SYN 7.5 PF PI (GLOVE) IMPLANT
GLOVE SURG UNDER POLY LF SZ7 (GLOVE) ×2 IMPLANT
GOWN STRL REUS W/ TWL LRG LVL3 (GOWN DISPOSABLE) ×16 IMPLANT
GOWN STRL REUS W/ TWL XL LVL3 (GOWN DISPOSABLE) ×8 IMPLANT
GOWN STRL REUS W/TWL LRG LVL3 (GOWN DISPOSABLE) ×20
GOWN STRL REUS W/TWL XL LVL3 (GOWN DISPOSABLE) ×15
HEMOSTAT POWDER SURGIFOAM 1G (HEMOSTASIS) ×17 IMPLANT
INSERT SUTURE HOLDER (MISCELLANEOUS) ×5 IMPLANT
KIT BASIN OR (CUSTOM PROCEDURE TRAY) ×5 IMPLANT
KIT SUCTION CATH 14FR (SUCTIONS) ×5 IMPLANT
KIT TURNOVER KIT B (KITS) ×5 IMPLANT
KIT VASOVIEW HEMOPRO 2 VH 4000 (KITS) ×5 IMPLANT
LEAD PACING MYOCARDI (MISCELLANEOUS) ×5 IMPLANT
MARKER GRAFT CORONARY BYPASS (MISCELLANEOUS) ×15 IMPLANT
NS IRRIG 1000ML POUR BTL (IV SOLUTION) ×25 IMPLANT
PACK ACCESSORY CANNULA KIT (KITS) ×5 IMPLANT
PACK E OPEN HEART (SUTURE) ×5 IMPLANT
PACK OPEN HEART (CUSTOM PROCEDURE TRAY) ×5 IMPLANT
PAD ARMBOARD 7.5X6 YLW CONV (MISCELLANEOUS) ×10 IMPLANT
PAD ELECT DEFIB RADIOL ZOLL (MISCELLANEOUS) ×5 IMPLANT
PENCIL BUTTON HOLSTER BLD 10FT (ELECTRODE) ×5 IMPLANT
POSITIONER HEAD DONUT 9IN (MISCELLANEOUS) ×5 IMPLANT
PUNCH AORTIC ROTATE 4.0MM (MISCELLANEOUS) ×5 IMPLANT
SET MPS 3-ND DEL (MISCELLANEOUS) ×2 IMPLANT
SPONGE T-LAP 18X18 ~~LOC~~+RFID (SPONGE) ×4 IMPLANT
SUPPORT HEART JANKE-BARRON (MISCELLANEOUS) ×5 IMPLANT
SUT BONE WAX W31G (SUTURE) ×5 IMPLANT
SUT ETHIBOND X763 2 0 SH 1 (SUTURE) ×10 IMPLANT
SUT MNCRL AB 3-0 PS2 18 (SUTURE) ×10 IMPLANT
SUT PDS AB 1 CTX 36 (SUTURE) ×10 IMPLANT
SUT PROLENE 4 0 SH DA (SUTURE) ×5 IMPLANT
SUT PROLENE 5 0 C 1 36 (SUTURE) ×19 IMPLANT
SUT PROLENE 7 0 BV1 MDA (SUTURE) ×6 IMPLANT
SUT STEEL 6MS V (SUTURE) ×10 IMPLANT
SUT STEEL STERNAL CCS#1 18IN (SUTURE) ×6 IMPLANT
SUT VIC AB 2-0 CT1 27 (SUTURE) ×5
SUT VIC AB 2-0 CT1 TAPERPNT 27 (SUTURE) IMPLANT
SUT VIC AB 3-0 X1 27 (SUTURE) ×1 IMPLANT
SYSTEM SAHARA CHEST DRAIN ATS (WOUND CARE) ×5 IMPLANT
TAPE CLOTH 4X10 WHT NS (GAUZE/BANDAGES/DRESSINGS) ×1 IMPLANT
TAPE PAPER 2X10 WHT MICROPORE (GAUZE/BANDAGES/DRESSINGS) ×1 IMPLANT
TOWEL GREEN STERILE (TOWEL DISPOSABLE) ×5 IMPLANT
TOWEL GREEN STERILE FF (TOWEL DISPOSABLE) ×5 IMPLANT
TRAY FOLEY SLVR 16FR TEMP STAT (SET/KITS/TRAYS/PACK) ×5 IMPLANT
TUBING LAP HI FLOW INSUFFLATIO (TUBING) ×5 IMPLANT
UNDERPAD 30X36 HEAVY ABSORB (UNDERPADS AND DIAPERS) ×5 IMPLANT
WATER STERILE IRR 1000ML POUR (IV SOLUTION) ×10 IMPLANT

## 2021-09-26 NOTE — Anesthesia Procedure Notes (Signed)
Central Venous Catheter Insertion Performed by: Annye Asa, MD, anesthesiologist Start/End12/03/2021 9:54 AM, 09/26/2021 10:05 AM Patient location: Pre-op. Preanesthetic checklist: patient identified, IV checked, risks and benefits discussed, surgical consent, monitors and equipment checked, pre-op evaluation, timeout performed and anesthesia consent Position: Trendelenburg Lidocaine 1% used for infiltration and patient sedated Hand hygiene performed , maximum sterile barriers used  and Seldinger technique used Catheter size: 8.5 Fr Central line was placed.Sheath introducer Procedure performed using ultrasound guided technique. Ultrasound Notes:anatomy identified, needle tip was noted to be adjacent to the nerve/plexus identified, no ultrasound evidence of intravascular and/or intraneural injection and image(s) printed for medical record Attempts: 1 Following insertion, line sutured, dressing applied and Biopatch. Post procedure assessment: blood return through all ports, free fluid flow and no air  Patient tolerated the procedure well with no immediate complications. Additional procedure comments: CVP: Timeout, sterile prep, drape, FBP R neck.  Trendelenburg position.  1% lido local, finder and trocar RIJ 1st pass with US guidance.  Cordis introducer placed over J wire, triple catheter inserted through. Biopatch and sterile dressing on.  Patient tolerated well.  VSS.  Jenita Seashore, MD .

## 2021-09-26 NOTE — Anesthesia Preprocedure Evaluation (Addendum)
Anesthesia Evaluation  Patient identified by MRN, date of birth, ID band Patient awake    Reviewed: Allergy & Precautions, NPO status , Patient's Chart, lab work & pertinent test results, reviewed documented beta blocker date and time   History of Anesthesia Complications Negative for: history of anesthetic complications  Airway Mallampati: I  TM Distance: >3 FB Neck ROM: Full    Dental  (+) Edentulous Upper, Edentulous Lower   Pulmonary former smoker,    breath sounds clear to auscultation       Cardiovascular hypertension, Pt. on medications and Pt. on home beta blockers + angina + CAD and + Past MI   Rhythm:Regular Rate:Bradycardia  09/21/2021 Cath: 2nd Mrg lesion is 75% stenosed. Marland Kitchen  RPDA lesion is 90% stenosed. .  Mid LM lesion is 90% stenosed. .  The left ventricular systolic function is normal.  09/22/2021 ECHO: EF 60-65%, normal LVF, mild LVH, Grade 1 DD, normal RVF, trivial MR   Neuro/Psych Sciatica/back pain negative psych ROS   GI/Hepatic negative GI ROS, Neg liver ROS,   Endo/Other  diabetes (glu 127), Oral Hypoglycemic Agentsobese  Renal/GU Renal InsufficiencyRenal disease     Musculoskeletal   Abdominal (+) + obese,   Peds  Hematology negative hematology ROS (+)   Anesthesia Other Findings   Reproductive/Obstetrics                            Anesthesia Physical Anesthesia Plan  ASA: 4  Anesthesia Plan: General   Post-op Pain Management:    Induction:   PONV Risk Score and Plan: 2 and Treatment may vary due to age or medical condition  Airway Management Planned: Oral ETT  Additional Equipment: Arterial line, CVP, TEE and Ultrasound Guidance Line Placement  Intra-op Plan:   Post-operative Plan: Post-operative intubation/ventilation  Informed Consent: I have reviewed the patients History and Physical, chart, labs and discussed the procedure including the risks,  benefits and alternatives for the proposed anesthesia with the patient or authorized representative who has indicated his/her understanding and acceptance.       Plan Discussed with: CRNA and Surgeon  Anesthesia Plan Comments:        Anesthesia Quick Evaluation

## 2021-09-26 NOTE — Anesthesia Postprocedure Evaluation (Signed)
Anesthesia Post Note  Patient: [place]  Procedure(s) Performed: CORONARY ARTERY BYPASS GRAFTING (CABG) X 3 , USING LEFT INTERNAL MAMMARY ARTERY & RIGHT LEG GREATER SAPHENOUS VEIN HARVESTED ENDOSCOPICALLY (Chest) TRANSESOPHAGEAL ECHOCARDIOGRAM (TEE) ENDOVEIN HARVEST OF GREATER SAPHENOUS VEIN (Right) APPLICATION OF CELL SAVER     Patient location during evaluation: SICU Anesthesia Type: General Level of consciousness: sedated and patient remains intubated per anesthesia plan Pain management: pain level controlled Vital Signs Assessment: post-procedure vital signs reviewed and stable Respiratory status: patient remains intubated per anesthesia plan and patient on ventilator - see flowsheet for VS (weaning from vent) Cardiovascular status: stable (on low-dose Norepinephrine) Postop Assessment: no apparent nausea or vomiting Anesthetic complications: no   No notable events documented.  Last Vitals:  Vitals:   09/26/21 1009 09/26/21 1500  BP: 134/62 (!) 98/58  Pulse: 61 76  Resp: 14 14  Temp:    SpO2: 98% 99%    Last Pain:  Vitals:   09/26/21 0941  TempSrc: Oral  PainSc:                  Julietta Batterman,E. Tully Burgo

## 2021-09-26 NOTE — Op Note (Signed)
[address]       [place] [address]             [phone]                                          09/26/2021 Patient:  Bentley Fissel [facility] Pre-Op Dx: Left main/three-vessel coronary artery disease   NSTEMI   Hypertension   Diabetes mellitus    Post-op Dx: Same Procedure: CABG X 3.  LIMA to LAD, reverse saphenous vein graft to OM 2, reverse saphenous vein graft to PDA Endoscopic greater saphenous vein harvest on the right   Surgeon and Role:      * Decklin Weddington, Lucile Crater, MD - Primary    *M. Roddenberry, PA-C- assisting An experienced assistant was required given the complexity of this surgery and the standard of surgical care. The assistant was needed for exposure, dissection, suctioning, retraction of delicate tissues and sutures, instrument exchange and for overall help during this procedure.    Anesthesia  general EBL: 500 ml Blood Administration: None Xclamp Time: 50 min Pump Time: 86 min  Drains: 19 F blake drain: L, mediastinal  Wires: Ventricular Counts: correct   Indications: This is an 80 year old gentleman this admitted following a left heart cath which showed severe left main disease.  He also has some disease off of his RCA.  He does have a history of diabetes and hypertension.  I personally reviewed his left heart cath and echocardiogram.  He does have good distal targets in all walls.  He also has good biventricular function and no significant valvular disease.  The risks and benefits of surgical revascularization have been discussed and he is agreeable to proceed.  Findings: Good vein, good LIMA.  The LAD was partially intramyocardial.  The PDA was small but there were good flows on the vein graft.  The obtuse marginal was a good quality vessel.  Patient did require defibrillation twice while coming off pump thus an amiodarone drip was started.  Operative Technique: All invasive lines were placed in pre-op holding.  After the risks,  benefits and alternatives were thoroughly discussed, the patient was brought to the operative theatre.  Anesthesia was induced, and the patient was prepped and draped in normal sterile fashion.  An appropriate surgical pause was performed, and pre-operative antibiotics were dosed accordingly.  We began with simultaneous incisions along the right leg for harvesting of the greater saphenous vein and the chest for the sternotomy.  In regards to the sternotomy, this was carried down with bovie cautery, and the sternum was divided with a reciprocating saw.  Meticulous hemostasis was obtained.  The left internal thoracic artery was exposed and harvested in in pedicled fashion.  The patient was systemically heparinized, and the artery was divided distally, and placed in a papaverine sponge.    The sternal elevator was removed, and a retractor was placed.  The pericardium was divided in the midline and fashioned into a cradle with pericardial stitches.   After we confirmed an appropriate ACT, the ascending aorta was cannulated in standard fashion.  The right atrial appendage was used for venous cannulation site.  Cardiopulmonary bypass was initiated, and the heart retractor was placed. The cross clamp was applied, and a dose of anterograde cardioplegia was given with good arrest of the heart.  We moved to the posterior wall  of the heart, and found a good target on the PDA.  An arteriotomy was made, and the vein graft was anastomosed to it in an end to side fashion.  Next we exposed the lateral wall, and found a good target on the obtuse marginal.  An end to side anastomosis with the vein graft was then created.  Finally, we exposed a good target on the LAD, and fashioned an end to side anastomosis between it and the LITA.  We began to re-warm, and a re-animation dose of cardioplegia was given.  The heart was de-aired, and the cross clamp was removed.  Meticulous hemostasis was obtained.    A partial occludding clamp  was then placed on the ascending aorta, and we created an end to side anastomosis between it and the proximal vein grafts.  Rings were placed on the proximal anastomosis.  Hemostasis was obtained, and we separated from cardiopulmonary bypass without event.  The heparin was reversed with protamine.  Chest tubes and wires were placed, and the sternum was re-approximated with sternal wires.  The soft tissue and skin were re-approximated wth absorbable suture.    The patient tolerated the procedure without any immediate complications, and was transferred to the ICU in guarded condition.  Brandace Cargle [name]

## 2021-09-26 NOTE — Anesthesia Procedure Notes (Signed)
Procedure Name: Intubation Date/Time: 09/26/2021 10:44 AM Performed by: Leonor Liv, CRNA Pre-anesthesia Checklist: Patient identified, Emergency Drugs available, Suction available and Patient being monitored Patient Re-evaluated:Patient Re-evaluated prior to induction Oxygen Delivery Method: Circle System Utilized Preoxygenation: Pre-oxygenation with 100% oxygen Induction Type: IV induction Ventilation: Mask ventilation without difficulty and Oral airway inserted - appropriate to patient size Laryngoscope Size: Mac and 4 Grade View: Grade I Tube type: Oral Tube size: 8.0 mm Number of attempts: 1 Airway Equipment and Method: Stylet and Oral airway Placement Confirmation: ETT inserted through vocal cords under direct vision, positive ETCO2 and breath sounds checked- equal and bilateral Secured at: 23 cm Tube secured with: Tape Dental Injury: Teeth and Oropharynx as per pre-operative assessment

## 2021-09-26 NOTE — Anesthesia Procedure Notes (Signed)
Arterial Line Insertion Start/End12/03/2021 10:00 AM, 09/26/2021 10:23 AM Performed by: Leonor Liv, CRNA, CRNA  Patient location: Pre-op. Preanesthetic checklist: patient identified, IV checked, site marked, risks and benefits discussed, surgical consent, monitors and equipment checked, pre-op evaluation, timeout performed and anesthesia consent Left, radial was placed Catheter size: 20 G Hand hygiene performed  and maximum sterile barriers used  Allen's test indicative of satisfactory collateral circulation Attempts: 1 Procedure performed without using ultrasound guided technique. Following insertion, dressing applied and Biopatch. Patient tolerated the procedure well with no immediate complications.

## 2021-09-26 NOTE — Progress Notes (Signed)
     [address]       [place] [address]             [phone]       No events  Vitals:   09/25/21 1951 09/26/21 0544  BP: 117/72 122/72  Pulse: 67 67  Resp: 18 16  Temp: 98.6 F (37 C) 98.4 F (36.9 C)  SpO2: 98% 98%   Alert NAD Sinus EWOB  OR today for CABG  Sotiria Keast O Asante Blanda

## 2021-09-26 NOTE — Consult Note (Signed)
NAME:  William [name], MRN:  [id number], DOB:  [birth year], LOS: 5 ADMISSION DATE:  09/20/2021, CONSULTATION DATE:  09/26/21 REFERRING MD:  Kipp Brood, CHIEF COMPLAINT:  post-CABG   History of Present Illness:  William [name] is an 80 y/o gentleman with a history of CAD, HTN, HLD, DM who presented on 12/1 with CP, back pain radiating down both arms associated with SOB. Pain responded to NTG in the ED. He was diagnosed with NSTEMI and was transferred to [facility].  He underwent CABG x 3 (LIMA to LADSVG to OM, SVG to PDA) on 12/6 and was transferred to the ICU. He had VF requiring DCCV at the end of the case and was started on amiodarone infusion without a bolus.  Pertinent  Medical History  BPH CAD Nephrolothiasis HTN HLD DM  Significant Hospital Events: Including procedures, antibiotic start and stop dates in addition to other pertinent events   12/1 admitted, [place] with 3-vessel disease, recommended for CABG 12/6 CABG x 3  Interim History / Subjective:    Objective   Blood pressure (!) 98/58, pulse 76, temperature 98 F (36.7 C), temperature source Oral, resp. rate 14, height 5\' 9"  (1.753 m), weight 95.7 kg, SpO2 99 %.    Vent Mode: SIMV;PRVC;PSV FiO2 (%):  [50 %] 50 % Set Rate:  [12 bmp] 12 bmp Vt Set:  [560 mL] 560 mL PEEP:  [5 cmH20] 5 cmH20 Pressure Support:  [10 cmH20] 10 cmH20 Plateau Pressure:  [17 cmH20] 17 cmH20   Intake/Output Summary (Last 24 hours) at 09/26/2021 1519 Last data filed at 09/26/2021 1508 Gross per 24 hour  Intake 3113 ml  Output 1250 ml  Net 1863 ml   Filed Weights   09/20/21 1151 09/26/21 0544 09/26/21 0941  Weight: 97.1 kg 96.1 kg 95.7 kg    Examination: General: critically ill appearing man intubated, sedated HENT: [place]/AT, eyes anicteric Lungs: CTAB, no significant tracheal secretions Cardiovascular: S1S2, RRR. Mediastinal drains with minimal bloody drainage. Abdomen: soft, NT Extremities: no LE edema, no cyanosis or clubbing Neuro: RASS -5,  pinpoint pupils, not breathing over the vent. GU: foley draining clear yellow urine.  Resolved Hospital Problem list     Assessment & Plan:  CAD with 3-vessel disease, s/p CABG x 3 Intra-op VF -ASA, high intensity statin (was on pravastatin PTA) -delay plavix initiation -complete peri-op antibiotics -pain control- tramadol, oxycodone, morphine PRN -con't amioarone -metoprolol once off pressors -tele monitoring -recheck BMP, Mg+ this afternoon  Shock, appears hypovolemic -albumin -wean NE to maintain MAP >65  Acute respiratory failure with hypoxia, expected post-op -rapid weaning protocol -wean FiO2 as able to maintain SpO2>90% -pulmonary hygiene  Hyperglycemia, h/o NIDDM. A1c 6.8 -holding home glipizide -insulin gtt per protocol  Acute blood loss anemia -transfuse for Hb<7 or hemodynamically significant bleeding -monitor  Acute thrombocytopenia- consumptive -Monitor, no indication for transfusion currently.  HLD -zetia, high intensity statin -previously intolerant to atorvastatin; monitor for intolerance of crestor  Former smoker -outside age range for lung cancer screening  BPH, h/o TURP in the past -monitor for retention after foley removal  Best Practice (right click and "Reselect all SmartList Selections" daily)   Diet/type: NPO DVT prophylaxis: SCD GI prophylaxis: H2B Lines: Central line, Arterial Line, and yes and it is still needed Foley:  Yes, and it is still needed Code Status:  full code Last date of multidisciplinary goals of care discussion [ ]   Labs   CBC: Recent Labs  Lab 09/22/21 0756 09/23/21 0211 09/24/21 0413 09/25/21 0303  09/26/21 0441 09/26/21 1052 09/26/21 1312 09/26/21 1313 09/26/21 1326 09/26/21 1411 09/26/21 1414  WBC 7.1 7.1 7.6 7.8 8.3  --   --   --   --   --   --   HGB 15.5 14.6 14.8 14.3 14.7   < > 9.9* 10.6* 9.2* 10.5* 10.9*  HCT 46.7 43.7 43.8 43.0 43.5   < > 29.0* 30.6* 27.0* 31.0* 32.0*  MCV 90.7 90.9 90.7  90.7 90.8  --   --   --   --   --   --   PLT 166 156 149* 146* 161  --   --  136*  --   --   --    < > = values in this interval not displayed.    Basic Metabolic Panel: Recent Labs  Lab 09/20/21 1145 09/22/21 0756 09/25/21 1204 09/26/21 0441 09/26/21 1052 09/26/21 1203 09/26/21 1231 09/26/21 1257 09/26/21 1312 09/26/21 1326 09/26/21 1411 09/26/21 1414  NA 137 137 136 137 141 139   < > 138 138 136 138 138  K 4.1 4.2 3.7 3.7 3.8 4.0   < > 4.9 4.7 5.0 4.4 4.4  CL 106 106 106 108 103 104  --  105  --   --   --  104  CO2 26 26 25 22   --   --   --   --   --   --   --   --   GLUCOSE 184* 143* 168* 146* 130* 133*  --  119*  --   --   --  138*  BUN 17 12 14 14 13 12   --  12  --   --   --  11  CREATININE 1.09 1.01 1.12 1.08 0.90 0.90  --  0.80  --   --   --  0.80  CALCIUM 8.9 9.2 9.1 9.0  --   --   --   --   --   --   --   --    < > = values in this interval not displayed.   GFR: Estimated Creatinine Clearance: 82.7 mL/min (by C-G formula based on SCr of 0.8 mg/dL). Recent Labs  Lab 09/23/21 0211 09/24/21 0413 09/25/21 0303 09/26/21 0441  WBC 7.1 7.6 7.8 8.3    Liver Function Tests: Recent Labs  Lab 09/25/21 1204  AST 23  ALT 31  ALKPHOS 55  BILITOT 0.7  PROT 6.3*  ALBUMIN 3.4*   No results for input(s): LIPASE, AMYLASE in the last 168 hours. No results for input(s): AMMONIA in the last 168 hours.  ABG    Component Value Date/Time   PHART 7.375 09/26/2021 1411   PCO2ART 40.8 09/26/2021 1411   PO2ART 145 (H) 09/26/2021 1411   HCO3 23.9 09/26/2021 1411   TCO2 23 09/26/2021 1414   ACIDBASEDEF 1.0 09/26/2021 1411   O2SAT 99.0 09/26/2021 1411     Coagulation Profile: Recent Labs  Lab 09/25/21 1204  INR 1.1    Cardiac Enzymes: No results for input(s): CKTOTAL, CKMB, CKMBINDEX, TROPONINI in the last 168 hours.  HbA1C: Hgb A1c MFr Bld  Date/Time Value Ref Range Status  09/21/2021 03:53 AM 6.8 (H) 4.8 - 5.6 % Final    Comment:    (NOTE) Pre diabetes:           5.7%-6.4%  Diabetes:              >6.4%  Glycemic control for   <7.0% adults with diabetes  07/06/2016 01:52 AM 5.8 (H) 4.8 - 5.6 % Final    Comment:    (NOTE)         Pre-diabetes: 5.7 - 6.4         Diabetes: >6.4         Glycemic control for adults with diabetes: <7.0     CBG: Recent Labs  Lab 09/25/21 0720 09/25/21 1128 09/25/21 1640 09/25/21 2208 09/26/21 0830  GLUCAP 140* 182* 201* 189* 127*    Review of Systems:   Unable to be obtained due to intubated/ sedated.  Past Medical History:  He,  has a past medical history of BPH (benign prostatic hyperplasia), CAD (coronary artery disease), Colon polyps, Full dentures, History of colon polyps, History of kidney stones, HTN (hypertension), Hyperlipidemia, Lower urinary tract symptoms (LUTS), Osteoarthritis, Sciatica, and Wears glasses.   Surgical History:   Past Surgical History:  Procedure Laterality Date   ANTERIOR CERVICAL DECOMP/DISCECTOMY FUSION  05-24-2009   C3 -- C7   APPENDECTOMY  age 62   CARDIAC CATHETERIZATION N/A 07/06/2016   Procedure: Left Heart Cath and Coronary Angiography;  Surgeon: Jettie Booze, MD;  Location: [facility];  Service: Cardiovascular;  Laterality: N/A;   CARDIOVASCULAR STRESS TEST  06-23-2015  dr Daneen Schick   normal lexiscan study/  no perfusion defects or ischemia/  normal LV function and wall motion , ef 57%   CHOLECYSTECTOMY     LEFT HEART CATH AND CORONARY ANGIOGRAPHY N/A 09/21/2021   Procedure: LEFT HEART CATH AND CORONARY ANGIOGRAPHY;  Surgeon: Lorretta Harp, MD;  Location: [facility];  Service: Cardiovascular;  Laterality: N/A;   LEFT HEART CATHETERIZATION WITH CORONARY ANGIOGRAM N/A 01/01/2013   Procedure: LEFT HEART CATHETERIZATION WITH CORONARY ANGIOGRAM;  Surgeon: Sinclair Grooms, MD;  Location: [facility];  Service: Cardiovascular;  Laterality: N/A;   Intermediate stenosis in  proximal PDA 50-70%;  dRCA 50%;  ostial and mid CFX 50%;  widely  patent LAD;  normal LVF, ef 60%   LUMBAR SPINE SURGERY  1990's   SHOULDER SURGERY Left    TRANSURETHRAL RESECTION OF PROSTATE N/A 07/04/2015   Procedure: TRANSURETHRAL RESECTION OF THE PROSTATE WITH GYRUS INSTRUMENTS;  Surgeon: Franchot Gallo, MD;  Location: [facility];  Service: Urology;  Laterality: N/A;     Social History:   reports that he quit smoking about 57 years ago. His smoking use included cigarettes. He has never used smokeless tobacco. He reports that he does not drink alcohol and does not use drugs.   Family History:  His family history includes Dementia in his father; Heart attack in an other family member. There is no history of Colon cancer, Rectal cancer, or Stomach cancer.   Allergies Allergies  Allergen Reactions   Codeine Nausea And Vomiting and Other (See Comments)    "deathly sick"   Lipitor [Atorvastatin] Other (See Comments)    Joint pain     Home Medications  Prior to Admission medications   Medication Sig Start Date End Date Taking? Authorizing Provider  ezetimibe (ZETIA) 10 MG tablet Take 10 mg by mouth daily.   Yes [provider]  glipiZIDE (GLUCOTROL) 5 MG tablet Take 5 mg by mouth every morning. 08/17/21  Yes [provider]  metoprolol (LOPRESSOR) 50 MG tablet Take 1 tablet (50 mg total) by mouth 2 (two) times daily. 01/01/13  Yes Belva Crome, MD  pravastatin (PRAVACHOL) 20 MG tablet Take 20 mg by mouth once a  week.   Yes [provider]  aspirin EC 81 MG tablet Take 81 mg by mouth daily.    [provider]     Critical care time: 45 min.     Julian Hy, DO 09/26/21 4:35 PM [facility]

## 2021-09-26 NOTE — Progress Notes (Signed)
      [address]       [place] [address]             [phone]      S/p CABG x 3  Intubated, sedated  BP 114/69   Pulse 76   Temp (!) 96.6 F (35.9 C)   Resp 14   Ht 5\' 9"  (1.753 m)   Wt 95.7 kg   SpO2 98%   BMI 31.16 kg/m   Intake/Output Summary (Last 24 hours) at 09/26/2021 1908 Last data filed at 09/26/2021 1900 Gross per 24 hour  Intake 4261.71 ml  Output 2030 ml  Net 2231.71 ml   CT minimal output  K= 3.9, HCt 35  Dong well early postop but not yet awake enough to wean  Remo Lipps C. Roxan Hockey, MD [facility] [phone]

## 2021-09-26 NOTE — Transfer of Care (Signed)
Immediate Anesthesia Transfer of Care Note  Patient: William [name]  Procedure(s) Performed: CORONARY ARTERY BYPASS GRAFTING (CABG) X 3 , USING LEFT INTERNAL MAMMARY ARTERY & RIGHT LEG GREATER SAPHENOUS VEIN HARVESTED ENDOSCOPICALLY (Chest) TRANSESOPHAGEAL ECHOCARDIOGRAM (TEE) ENDOVEIN HARVEST OF GREATER SAPHENOUS VEIN (Right) APPLICATION OF CELL SAVER  Patient Location: SICU  Anesthesia Type:General  Level of Consciousness: Patient remains intubated per anesthesia plan  Airway & Oxygen Therapy: Patient remains intubated per anesthesia plan and Patient placed on Ventilator (see vital sign flow sheet for setting)  Post-op Assessment: Post -op Vital signs reviewed and stable  Post vital signs: stable  Last Vitals:  Vitals Value Taken Time  BP 98/58 09/26/21 1501  Temp    Pulse 76 09/26/21 1500  Resp 12 09/26/21 1508  SpO2 97 % 09/26/21 1507  Vitals shown include unvalidated device data.  Last Pain:  Vitals:   09/26/21 0941  TempSrc: Oral  PainSc:       Patients Stated Pain Goal: 0 (59/56/38 7564)  Complications: No notable events documented.

## 2021-09-26 NOTE — Progress Notes (Signed)
Progress Note  Patient Name: William [name] [facility] Date of Encounter: 09/26/2021  Primary Cardiologist: Sinclair Grooms, MD   Subjective   Patient was seen and examined this morning before going to the operating room.  His wife was at his bedside.  He was anxious but thankful that he is going to be undergoing his procedure today.  He had no questions.  Inpatient Medications    Scheduled Meds:  [MAR Hold] aspirin  81 mg Oral Daily   bisacodyl  5 mg Oral Once   epinephrine  0-10 mcg/min Intravenous To OR   [MAR Hold] ezetimibe  10 mg Oral Daily   heparin-papaverine-plasmalyte irrigation   Irrigation To OR   [MAR Hold] hydrALAZINE  25 mg Oral Q8H   [MAR Hold] insulin aspart  0-9 Units Subcutaneous TID AC   insulin   Intravenous To OR   [place] Blood Cardioplegia vial (lidocaine/magnesium/mannitol 0.26g-4g-6.4g)   Intracoronary To OR   [MAR Hold] metoprolol tartrate  100 mg Oral BID   phenylephrine  30-200 mcg/min Intravenous To OR   potassium chloride  80 mEq Other To OR   [MAR Hold] rosuvastatin  10 mg Oral Daily   [MAR Hold] sodium chloride flush  3 mL Intravenous Q12H   tranexamic acid  15 mg/kg Intravenous To OR   tranexamic acid  2 mg/kg Intracatheter To OR   Continuous Infusions:  [MAR Hold] sodium chloride      ceFAZolin (ANCEF) IV      ceFAZolin (ANCEF) IV     dexmedetomidine     heparin 30,000 units/NS 1000 mL solution for CELLSAVER     heparin 1,650 Units/hr (09/25/21 0529)   lactated ringers 10 mL/hr at 09/26/21 0945   milrinone     nitroGLYCERIN     norepinephrine     tranexamic acid (CYKLOKAPRON) infusion ([facility])     vancomycin     PRN Meds: [MAR Hold] sodium chloride, [MAR Hold] acetaminophen, [MAR Hold] LORazepam, [MAR Hold] nitroGLYCERIN, [MAR Hold] ondansetron (ZOFRAN) IV, [MAR Hold] ondansetron **OR** [DISCONTINUED] ondansetron (ZOFRAN) IV, [MAR Hold] polyethylene glycol, [MAR Hold] sodium chloride flush, temazepam   Vital Signs    Vitals:   09/26/21  0845 09/26/21 0918 09/26/21 0941 09/26/21 1009  BP:  125/79 (!) 141/76 134/62  Pulse: 70 71 69 61  Resp:  16 17 14   Temp:  97.7 F (36.5 C) 98 F (36.7 C)   TempSrc:  Oral Oral   SpO2:  95% 99% 98%  Weight:   95.7 kg   Height:   5\' 9"  (1.753 m)     Intake/Output Summary (Last 24 hours) at 09/26/2021 1040 Last data filed at 09/26/2021 0557 Gross per 24 hour  Intake --  Output 200 ml  Net -200 ml   Filed Weights   09/20/21 1151 09/26/21 0544 09/26/21 0941  Weight: 97.1 kg 96.1 kg 95.7 kg    Telemetry    Sinus rhythm- Personally Reviewed  ECG    None today- Personally Reviewed  Physical Exam   GEN: No acute distress.   Neck: No JVD Cardiac: RRR, no murmurs, rubs, or gallops.  Respiratory: Clear to auscultation bilaterally. GI: Soft, nontender, non-distended  MS: No edema; No deformity. Neuro:  Nonfocal  Psych: Normal affect   Labs    Chemistry Recent Labs  Lab 09/22/21 0756 09/25/21 1204 09/26/21 0441  NA 137 136 137  K 4.2 3.7 3.7  CL 106 106 108  CO2 26 25 22   GLUCOSE 143* 168* 146*  BUN 12 14 14   CREATININE 1.01 1.12 1.08  CALCIUM 9.2 9.1 9.0  PROT  --  6.3*  --   ALBUMIN  --  3.4*  --   AST  --  23  --   ALT  --  31  --   ALKPHOS  --  55  --   BILITOT  --  0.7  --   GFRNONAA >60 >60 >60  ANIONGAP 5 5 7      Hematology Recent Labs  Lab 09/24/21 0413 09/25/21 0303 09/26/21 0441  WBC 7.6 7.8 8.3  RBC 4.83 4.74 4.79  HGB 14.8 14.3 14.7  HCT 43.8 43.0 43.5  MCV 90.7 90.7 90.8  MCH 30.6 30.2 30.7  MCHC 33.8 33.3 33.8  RDW 13.2 13.2 13.2  PLT 149* 146* 161    Cardiac EnzymesNo results for input(s): TROPONINI in the last 168 hours. No results for input(s): TROPIPOC in the last 168 hours.   BNPNo results for input(s): BNP, PROBNP in the last 168 hours.   DDimer No results for input(s): DDIMER in the last 168 hours.   Radiology    No results found.  Cardiac Studies   TTE 09/22/21  1. Left ventricular ejection fraction, by  estimation, is 60 to 65%. The  left ventricle has normal function. The left ventricle has no regional  wall motion abnormalities. There is mild left ventricular hypertrophy.  Left ventricular diastolic parameters  are consistent with Grade I diastolic dysfunction (impaired relaxation).   2. Right ventricular systolic function is normal. The right ventricular  size is normal. There is normal pulmonary artery systolic pressure. The  estimated right ventricular systolic pressure is 96.2 mmHg.   3. The mitral valve is normal in structure. Trivial mitral valve  regurgitation.   4. The aortic valve was not well visualized. Aortic valve regurgitation  is not visualized. Aortic valve sclerosis/calcification is present,  without any evidence of aortic stenosis.   5. The inferior vena cava is normal in size with greater than 50%  respiratory variability, suggesting right atrial pressure of 3 mmHg.     LHC 09/22/2021   2nd Mrg lesion is 75% stenosed.   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   Mid LM lesion is 90% stenosed.   The left ventricular systolic function is normal.   LV end diastolic pressure is mildly elevated.   The left ventricular ejection fraction is 50-55% by visual estimate.  Patient Profile     80 y.o. male CAD (nonobstructive CAD by cath in 2017), HTN and HLD who presented to [facility] ED on 09/20/2021 for evaluation of chest pain. Found to have an NSTEMI, here with LM dx pending evaluation for CABG planned for today.  Assessment & Plan    Multivessel CAD Diabetes mellitus Hypertension  Recent left heart catheterization with evidence of multivessel disease, plan for coronary artery bypass grafting today with Dr. Kipp Brood.  The patient is looking forward to his procedure but is anxious.  He had no questions.  He still is agreeable to move forward with this.  Diabetes mellitus will be managed on sliding scale.  Blood pressure is acceptable.  Further recommendation  postoperative.  For questions or updates, please contact [place] Please consult [email or web address] for contact info under Cardiology/STEMI.      Signed, Alanea Woolridge, DO  09/26/2021, 10:40 AM

## 2021-09-26 NOTE — Progress Notes (Signed)
Rapid wean initiated per protocol  

## 2021-09-26 NOTE — Brief Op Note (Signed)
09/20/2021 - 09/26/2021  1:27 PM  [name]  80 y.o. male  PRE-OPERATIVE DIAGNOSIS:  Coronary artery disease, acute NSTEMI  POST-OPERATIVE DIAGNOSIS:  Coronary artery disease, acute NSTEMI  PROCEDURE:  CORONARY ARTERY BYPASS GRAFTING (CABG) X 3 , USING LEFT INTERNAL MAMMARY ARTERY & RIGHT LEG GREATER SAPHENOUS VEIN HARVESTED ENDOSCOPICALLY   LIMA-LAD SVG-OM SVG-PDA  Vein harvest time: 75min     Vein prep time:  43min  TRANSESOPHAGEAL ECHOCARDIOGRAM (TEE) (N/A)  ENDOVEIN HARVEST OF GREATER SAPHENOUS VEIN (Right)  APPLICATION OF CELL SAVER  SURGEON:  Lightfoot, Lucile Crater, MD - Primary  PHYSICIAN ASSISTANT: Kele Withem, PA-C  ASSISTANTS: Gaylyn Cheers, RN, RN First Assistant   ANESTHESIA:   general  BLOOD ADMINISTERED:none  DRAINS:  mediastinal and left pleural tubes    LOCAL MEDICATIONS USED:  NONE  SPECIMEN:  No Specimen  DISPOSITION OF SPECIMEN:  N/A  COUNTS:  YES  DICTATION: .Dragon Dictation  PLAN OF CARE: Admit to inpatient   PATIENT DISPOSITION:  ICU - intubated and hemodynamically stable.   Delay start of Pharmacological VTE agent (>24hrs) due to surgical blood loss or risk of bleeding: yes

## 2021-09-27 ENCOUNTER — Other Ambulatory Visit (HOSPITAL_COMMUNITY): Payer: Self-pay

## 2021-09-27 ENCOUNTER — Inpatient Hospital Stay (HOSPITAL_COMMUNITY): Payer: Medicare Other

## 2021-09-27 ENCOUNTER — Encounter (HOSPITAL_COMMUNITY): Payer: Self-pay | Admitting: Thoracic Surgery (Cardiothoracic Vascular Surgery)

## 2021-09-27 DIAGNOSIS — E1151 Type 2 diabetes mellitus with diabetic peripheral angiopathy without gangrene: Secondary | ICD-10-CM | POA: Diagnosis not present

## 2021-09-27 DIAGNOSIS — R579 Shock, unspecified: Secondary | ICD-10-CM

## 2021-09-27 DIAGNOSIS — Z951 Presence of aortocoronary bypass graft: Secondary | ICD-10-CM

## 2021-09-27 DIAGNOSIS — I200 Unstable angina: Secondary | ICD-10-CM | POA: Diagnosis not present

## 2021-09-27 LAB — POCT I-STAT 7, (LYTES, BLD GAS, ICA,H+H)
Acid-base deficit: 5 mmol/L — ABNORMAL HIGH (ref 0.0–2.0)
Bicarbonate: 20.5 mmol/L (ref 20.0–28.0)
Calcium, Ion: 1.25 mmol/L (ref 1.15–1.40)
HCT: 32 % — ABNORMAL LOW (ref 39.0–52.0)
Hemoglobin: 10.9 g/dL — ABNORMAL LOW (ref 13.0–17.0)
O2 Saturation: 96 %
Patient temperature: 36.6
Potassium: 3.9 mmol/L (ref 3.5–5.1)
Sodium: 140 mmol/L (ref 135–145)
TCO2: 22 mmol/L (ref 22–32)
pCO2 arterial: 39.8 mmHg (ref 32.0–48.0)
pH, Arterial: 7.318 — ABNORMAL LOW (ref 7.350–7.450)
pO2, Arterial: 85 mmHg (ref 83.0–108.0)

## 2021-09-27 LAB — GLUCOSE, CAPILLARY
Glucose-Capillary: 166 mg/dL — ABNORMAL HIGH (ref 70–99)
Glucose-Capillary: 174 mg/dL — ABNORMAL HIGH (ref 70–99)
Glucose-Capillary: 180 mg/dL — ABNORMAL HIGH (ref 70–99)
Glucose-Capillary: 139 mg/dL — ABNORMAL HIGH (ref 70–99)
Glucose-Capillary: 165 mg/dL — ABNORMAL HIGH (ref 70–99)
Glucose-Capillary: 140 mg/dL — ABNORMAL HIGH (ref 70–99)
Glucose-Capillary: 138 mg/dL — ABNORMAL HIGH (ref 70–99)
Glucose-Capillary: 151 mg/dL — ABNORMAL HIGH (ref 70–99)
Glucose-Capillary: 147 mg/dL — ABNORMAL HIGH (ref 70–99)
Glucose-Capillary: 124 mg/dL — ABNORMAL HIGH (ref 70–99)
Glucose-Capillary: 143 mg/dL — ABNORMAL HIGH (ref 70–99)
Glucose-Capillary: 134 mg/dL — ABNORMAL HIGH (ref 70–99)

## 2021-09-27 LAB — BASIC METABOLIC PANEL
Anion gap: 8 (ref 5–15)
Anion gap: 9 (ref 5–15)
BUN: 11 mg/dL (ref 8–23)
BUN: 12 mg/dL (ref 8–23)
CO2: 21 mmol/L — ABNORMAL LOW (ref 22–32)
CO2: 22 mmol/L (ref 22–32)
Calcium: 8.5 mg/dL — ABNORMAL LOW (ref 8.9–10.3)
Calcium: 8.8 mg/dL — ABNORMAL LOW (ref 8.9–10.3)
Chloride: 104 mmol/L (ref 98–111)
Chloride: 103 mmol/L (ref 98–111)
Creatinine, Ser: 0.93 mg/dL (ref 0.61–1.24)
Creatinine, Ser: 1.08 mg/dL (ref 0.61–1.24)
GFR, Estimated: >60 mL/min (ref >60)
GFR, Estimated: >60 mL/min (ref >60)
Glucose, Bld: 152 mg/dL — ABNORMAL HIGH (ref 70–99)
Glucose, Bld: 150 mg/dL — ABNORMAL HIGH (ref 70–99)
Potassium: 4.1 mmol/L (ref 3.5–5.1)
Potassium: 4.3 mmol/L (ref 3.5–5.1)
Sodium: 133 mmol/L — ABNORMAL LOW (ref 135–145)
Sodium: 134 mmol/L — ABNORMAL LOW (ref 135–145)

## 2021-09-27 LAB — CBC
HCT: 35.5 % — ABNORMAL LOW (ref 39.0–52.0)
HCT: 35 % — ABNORMAL LOW (ref 39.0–52.0)
Hemoglobin: 11.9 g/dL — ABNORMAL LOW (ref 13.0–17.0)
Hemoglobin: 11.5 g/dL — ABNORMAL LOW (ref 13.0–17.0)
MCH: 30.4 pg (ref 26.0–34.0)
MCH: 30.7 pg (ref 26.0–34.0)
MCHC: 33.5 g/dL (ref 30.0–36.0)
MCHC: 32.9 g/dL (ref 30.0–36.0)
MCV: 90.8 fL (ref 80.0–100.0)
MCV: 93.6 fL (ref 80.0–100.0)
Platelets: 142 10*3/uL — ABNORMAL LOW (ref 150–400)
Platelets: 135 10*3/uL — ABNORMAL LOW (ref 150–400)
RBC: 3.91 MIL/uL — ABNORMAL LOW (ref 4.22–5.81)
RBC: 3.74 MIL/uL — ABNORMAL LOW (ref 4.22–5.81)
RDW: 13.2 % (ref 11.5–15.5)
RDW: 13.8 % (ref 11.5–15.5)
WBC: 18.7 10*3/uL — ABNORMAL HIGH (ref 4.0–10.5)
WBC: 19.6 10*3/uL — ABNORMAL HIGH (ref 4.0–10.5)
nRBC: 0 % (ref 0.0–0.2)
nRBC: 0 % (ref 0.0–0.2)

## 2021-09-27 LAB — MAGNESIUM
Magnesium: 2.5 mg/dL — ABNORMAL HIGH (ref 1.7–2.4)
Magnesium: 2.2 mg/dL (ref 1.7–2.4)

## 2021-09-27 MED ORDER — MIDODRINE HCL 5 MG PO TABS
5.0000 mg | ORAL_TABLET | Freq: Three times a day (TID) | ORAL | Status: DC
  Administered 2021-09-27 – 2021-09-29 (×8): 5 mg via ORAL
  Filled 2021-09-27 (×8): qty 1

## 2021-09-27 MED ORDER — ALBUMIN HUMAN 5 % IV SOLN
12.5000 g | Freq: Once | INTRAVENOUS | Status: AC
  Administered 2021-09-27: 12.5 g via INTRAVENOUS

## 2021-09-27 MED ORDER — ENOXAPARIN SODIUM 40 MG/0.4ML IJ SOSY
40.0000 mg | PREFILLED_SYRINGE | Freq: Every day | INTRAMUSCULAR | Status: DC
  Administered 2021-09-27 – 2021-09-30 (×4): 40 mg via SUBCUTANEOUS
  Filled 2021-09-27 (×4): qty 0.4

## 2021-09-27 MED ORDER — INSULIN ASPART 100 UNIT/ML IJ SOLN
0.0000 [IU] | INTRAMUSCULAR | Status: DC
  Administered 2021-09-27 – 2021-09-28 (×4): 2 [IU] via SUBCUTANEOUS
  Administered 2021-09-28: 4 [IU] via SUBCUTANEOUS
  Administered 2021-09-28 – 2021-09-29 (×5): 2 [IU] via SUBCUTANEOUS

## 2021-09-27 MED ORDER — SODIUM CHLORIDE 0.9 % IV SOLN
6.2500 mg | Freq: Four times a day (QID) | INTRAVENOUS | Status: DC | PRN
  Administered 2021-09-27: 6.25 mg via INTRAVENOUS
  Filled 2021-09-27 (×4): qty 0.25

## 2021-09-27 MED ORDER — INSULIN DETEMIR 100 UNIT/ML ~~LOC~~ SOLN
10.0000 [IU] | Freq: Two times a day (BID) | SUBCUTANEOUS | Status: DC
  Administered 2021-09-27 – 2021-10-01 (×9): 10 [IU] via SUBCUTANEOUS
  Filled 2021-09-27 (×11): qty 0.1

## 2021-09-27 MED ORDER — EZETIMIBE 10 MG PO TABS
10.0000 mg | ORAL_TABLET | Freq: Every day | ORAL | Status: DC
  Administered 2021-09-27 – 2021-10-01 (×5): 10 mg via ORAL
  Filled 2021-09-27 (×5): qty 1

## 2021-09-27 MED ORDER — AMIODARONE HCL 200 MG PO TABS
400.0000 mg | ORAL_TABLET | Freq: Two times a day (BID) | ORAL | Status: DC
  Administered 2021-09-27 – 2021-09-29 (×6): 400 mg via ORAL
  Filled 2021-09-27 (×6): qty 2

## 2021-09-27 MED FILL — Electrolyte-R (PH 7.4) Solution: INTRAVENOUS | Qty: 4000 | Status: AC

## 2021-09-27 MED FILL — Sodium Bicarbonate IV Soln 8.4%: INTRAVENOUS | Qty: 50 | Status: AC

## 2021-09-27 MED FILL — Calcium Chloride Inj 10%: INTRAVENOUS | Qty: 10 | Status: AC

## 2021-09-27 MED FILL — Mannitol IV Soln 20%: INTRAVENOUS | Qty: 500 | Status: AC

## 2021-09-27 MED FILL — Sodium Chloride IV Soln 0.9%: INTRAVENOUS | Qty: 2000 | Status: AC

## 2021-09-27 MED FILL — Heparin Sodium (Porcine) Inj 1000 Unit/ML: INTRAMUSCULAR | Qty: 10 | Status: AC

## 2021-09-27 NOTE — Procedures (Signed)
Extubation Procedure Note  Patient Details:   Name: William [name] DOB: [birth year] MRN: [id number]   Airway Documentation:    Vent end date: 09/27/21 Vent end time: 0000   Evaluation  O2 sats: stable throughout Complications: No apparent complications Patient did tolerate procedure well. Bilateral Breath Sounds: Clear, Diminished   Yes  Pt extubated to 4L [place] per protocol. NIF:-30 VC: .8L IS: 519ml best of three. Positive cuff leak, no stridor noted. Pt tolerated well    Clance Boll 09/27/2021, 12:06 AM

## 2021-09-27 NOTE — TOC Benefit Eligibility Note (Signed)
Patient Teacher, English as a foreign language completed.    The patient is currently admitted and upon discharge could be taking Farxiga 10 mg.  The current 30 day co-pay is, $20.00.   The patient is currently admitted and upon discharge could be taking Jardiance 10 mg.  The current 30 day co-pay is, $20.00.   The patient is insured through [employer], [place] Patient Advocate Specialist [place] Patient Advocate Team Direct Number: [phone]  Fax: [phone]

## 2021-09-27 NOTE — Progress Notes (Signed)
Progress Note  Patient Name: [name] Date of Encounter: 09/27/2021  Primary Cardiologist: Sinclair Grooms, MD   Subjective   Patient was seen and examined this morning at his bedside.  His wife is by the bedside.  He is postop day 1.  He was awake and extubated overnight.  He is speaking in clear sentences.  Inpatient Medications    Scheduled Meds:  acetaminophen  1,000 mg Oral Q6H   Or   acetaminophen (TYLENOL) oral liquid 160 mg/5 mL  1,000 mg Per Tube Q6H   amiodarone  400 mg Oral BID   aspirin EC  325 mg Oral Daily   Or   aspirin  324 mg Per Tube Daily   bisacodyl  10 mg Oral Daily   Or   bisacodyl  10 mg Rectal Daily   chlorhexidine gluconate (MEDLINE KIT)  15 mL Mouth Rinse BID   Chlorhexidine Gluconate Cloth  6 each Topical Daily   docusate sodium  200 mg Oral Daily   enoxaparin (LOVENOX) injection  40 mg Subcutaneous QHS   ezetimibe  10 mg Oral Daily   insulin aspart  0-24 Units Subcutaneous Q4H   insulin detemir  10 Units Subcutaneous BID   mouth rinse  15 mL Mouth Rinse 10 times per day   metoprolol tartrate  12.5 mg Oral BID   midodrine  5 mg Oral TID WC   [START ON 09/28/2021] pantoprazole  40 mg Oral Daily   rosuvastatin  20 mg Oral Daily   sodium chloride flush  10-40 mL Intracatheter Q12H   sodium chloride flush  3 mL Intravenous Q12H   Continuous Infusions:  sodium chloride     sodium chloride     sodium chloride     albumin human Stopped (09/26/21 2003)    ceFAZolin (ANCEF) IV Stopped (09/27/21 0906)   dexmedetomidine (PRECEDEX) IV infusion Stopped (09/26/21 1755)   insulin 3.4 Units/hr (09/27/21 1000)   lactated ringers     lactated ringers     lactated ringers 20 mL/hr at 09/27/21 1000   nitroGLYCERIN 0 mcg/min (09/26/21 1455)   phenylephrine (NEO-SYNEPHRINE) Adult infusion 10 mcg/min (09/27/21 1000)   promethazine (PHENERGAN) injection (IM or IVPB)     PRN Meds: sodium chloride, albumin human, dextrose, lactated ringers,  metoprolol tartrate, midazolam, morphine injection, ondansetron (ZOFRAN) IV, oxyCODONE, promethazine (PHENERGAN) injection (IM or IVPB), sodium chloride flush, sodium chloride flush, traMADol   Vital Signs    Vitals:   09/27/21 0930 09/27/21 0945 09/27/21 1000 09/27/21 1027  BP:   (!) 98/54 (!) 127/40  Pulse:    71  Resp: $Remo'17 15 16   'gsuqa$ Temp: 98.6 F (37 C) 98.6 F (37 C) 98.6 F (37 C)   TempSrc:   Bladder   SpO2: 92% 92% 92%   Weight:      Height:        Intake/Output Summary (Last 24 hours) at 09/27/2021 1055 Last data filed at 09/27/2021 1031 Gross per 24 hour  Intake 6093.65 ml  Output 2630 ml  Net 3463.65 ml   Filed Weights   09/26/21 0544 09/26/21 0941 09/27/21 0500  Weight: 96.1 kg 95.7 kg 103.4 kg    Telemetry    Sinus rhythm- Personally Reviewed  ECG    None today- Personally Reviewed  Physical Exam   GEN: No acute distress.   Neck: No JVD, right IJ in place. Cardiac: RRR, no murmurs, rubs, or gallops.  Respiratory: Clear to auscultation bilaterally. Chest: Chest tube  in place. GI: Soft, nontender, non-distended  MS: No edema; No deformity. Neuro:  Nonfocal  Psych: Normal affect    Labs    Chemistry Recent Labs  Lab 09/25/21 1204 09/26/21 0441 09/26/21 1639 09/26/21 2055 09/26/21 2357 09/27/21 0209 09/27/21 0412  NA 136   < > 135 135 140 140 133*  K 3.7   < > 3.9 3.8 3.8 3.9 4.1  CL 106   < > 106 107  --   --  104  CO2 25   < > 22 21*  --   --  21*  GLUCOSE 168*   < > 161* 161*  --   --  152*  BUN 14   < > 9 11  --   --  11  CREATININE 1.12   < > 0.97 0.98  --   --  0.93  CALCIUM 9.1   < > 8.4* 8.4*  --   --  8.5*  PROT 6.3*  --   --   --   --   --   --   ALBUMIN 3.4*  --   --   --   --   --   --   AST 23  --   --   --   --   --   --   ALT 31  --   --   --   --   --   --   ALKPHOS 55  --   --   --   --   --   --   BILITOT 0.7  --   --   --   --   --   --   GFRNONAA >60   < > >60 >60  --   --  >60  ANIONGAP 5   < > 7 7  --   --  8   <  > = values in this interval not displayed.     Hematology Recent Labs  Lab 09/26/21 1522 09/26/21 1524 09/26/21 2055 09/26/21 2357 09/27/21 0209 09/27/21 0412  WBC 18.3*  --  17.3*  --   --  18.7*  RBC 3.97*  --  3.99*  --   --  3.91*  HGB 12.4*   < > 12.4* 11.6* 10.9* 11.9*  HCT 36.2*   < > 35.9* 34.0* 32.0* 35.5*  MCV 91.2  --  90.0  --   --  90.8  MCH 31.2  --  31.1  --   --  30.4  MCHC 34.3  --  34.5  --   --  33.5  RDW 13.1  --  12.9  --   --  13.2  PLT 121*  --  130*  --   --  142*   < > = values in this interval not displayed.    Cardiac EnzymesNo results for input(s): TROPONINI in the last 168 hours. No results for input(s): TROPIPOC in the last 168 hours.   BNPNo results for input(s): BNP, PROBNP in the last 168 hours.   DDimer No results for input(s): DDIMER in the last 168 hours.   Radiology    DG Chest Port 1 View  Result Date: 09/27/2021 CLINICAL DATA:  Status post CABG, sore chest EXAM: PORTABLE CHEST 1 VIEW COMPARISON:  Chest radiograph 1 day prior FINDINGS: The endotracheal tube has been removed. The enteric catheter has been removed. A right IJ central venous catheter is stable. The mediastinal drain and left basilar chest  tube are stable. The heart and mediastinum are prominent, unchanged. Probable trace bilateral pleural effusions are not significantly changed. There is no new or worsening airspace disease. There is no pneumothorax. There is no acute osseous abnormality. IMPRESSION: 1. Interval extubation and removal of the enteric catheter. Remaining support devices as above. 2. Trace bilateral pleural effusions, unchanged. No new or worsening airspace disease. Electronically Signed   By: Valetta Mole M.D.   On: 09/27/2021 08:46   DG Chest Port 1 View  Result Date: 09/26/2021 CLINICAL DATA:  Status post CABG EXAM: PORTABLE CHEST 1 VIEW COMPARISON:  09/20/2021 FINDINGS: Status post interval median sternotomy and CABG. Support apparatus includes endotracheal  tube, esophagogastric tube, right neck vascular catheter, and mediastinal and left chest tubes. Esophagogastric tube tip is below the diaphragm, side port is at the level of the gastroesophageal junction. Recommend slight advancement. Small bilateral pleural effusions. IMPRESSION: 1. Status post median sternotomy and CABG. 2. Esophagogastric tube tip is below the diaphragm, side port is at the level of the gastroesophageal junction. Recommend slight advancement. 3. Support apparatus is otherwise satisfactory. 4. Small bilateral pleural effusions. Electronically Signed   By: Delanna Ahmadi M.D.   On: 09/26/2021 15:16   ECHO INTRAOPERATIVE TEE  Result Date: 09/26/2021  *INTRAOPERATIVE TRANSESOPHAGEAL REPORT *  Patient Name:   ABDALLAH HERN [facility] Date of Exam: 09/26/2021 Medical Rec #:  [id number]        Height:       69.0 in Accession #:    [id number]       Weight:       211.0 lb Date of Birth:  1941/09/10       BSA:          2.11 m Patient Age:    2 years         BP:           118/76 mmHg Patient Gender: M                HR:           66 bpm. Exam Location:  Inpatient Transesophogeal exam was perform intraoperatively during surgical procedure. Patient was closely monitored under general anesthesia during the entirety of examination. Indications:     CABG Sonographer:     Merrie Roof RDCS Performing Phys: [id number] Lucile Crater LIGHTFOOT Diagnosing Phys: Annye Asa MD Complications: No known complications during this procedure. POST-OP IMPRESSIONS Limited post-CPB exam: The patient separated easily from CPB. _ Left Ventricle: The left ventricular function improved with time off of CPB, and is unchanged from pre-bypass function. There are no regional wall motion abnormalities. Overall EF 55-60%. _ Aortic Valve: The aortic valve function appears unchanged from pre-bypass images. _ Mitral Valve: Initially the patient had moderate, central MR with separation from CPB. This improved with time off of pump. Only mild MR was  seen by the end of surgery. _ Tricuspid Valve: The tricuspid valve function appears unchanged from pre-bypass images. PRE-OP FINDINGS  Left Ventricle: The left ventricle has hyperdynamic systolic function, with an ejection fraction of >65%, measured 67%. The cavity size was normal. No evidence of left ventricular regional wall motion abnormalities. There is no left ventricular hypertrophy. Left ventricular diastolic function was not evaluated. Right Ventricle: The right ventricle has normal systolic function. The cavity was normal. There is no increase in right ventricular wall thickness. Left Atrium: Left atrial size was normal in size. No left atrial/left atrial appendage thrombus was detected. Left atrial appendage velocity is  normal at greater than 40 cm/s. Right Atrium: Right atrial size was normal in size. Interatrial Septum: No atrial level shunt detected by color flow Doppler. There is no evidence of a patent foramen ovale. Pericardium: There is no evidence of pericardial effusion. Mitral Valve: The mitral valve is normal in structure. Mitral valve regurgitation is trivial by color flow Doppler. There is no evidence of mitral valve vegetation. Pulmonary venous flow is normal. There is no evidence of mitral stenosis, with peak gradient 3 mmHg. Tricuspid Valve: The tricuspid valve was normal in structure. Tricuspid valve regurgitation is trivial by color flow Doppler. No evidence of tricuspid stenosis is present. There is no evidence of tricuspid valve vegetation. Aortic Valve: The aortic valve is tricuspid. Aortic valve regurgitation was not visualized by color flow Doppler. There is no stenosis of the aortic valve, with peak gradient 10 mmHg, mean gradient 5 mmHg. There is no evidence of aortic valve vegetation. Pulmonic Valve: The pulmonic valve was normal in structure, with normal leaflet excursion. No evidence of pumonic stenosis. Pulmonic valve regurgitation is not visualized by color flow Doppler.  Aorta: The aortic root and ascending aorta are normal in size and structure. The aortic arch was not well visualized. There is evidence of scattered plaque in the descending aorta; Grade III, measuring 3-16mm in size. Pulmonary Artery: The pulmonary artery is of normal size. Venous: The inferior vena cava is normal in size with greater than 50% respiratory variability, suggesting right atrial pressure of 3 mmHg. Shunts: There is no evidence of an atrial septal defect.  Annye Asa MD Electronically signed by Annye Asa MD Signature Date/Time: 09/26/2021/5:10:26 PM    Final     Cardiac Studies   TTE 09/22/21  1. Left ventricular ejection fraction, by estimation, is 60 to 65%. The  left ventricle has normal function. The left ventricle has no regional  wall motion abnormalities. There is mild left ventricular hypertrophy.  Left ventricular diastolic parameters  are consistent with Grade I diastolic dysfunction (impaired relaxation).   2. Right ventricular systolic function is normal. The right ventricular  size is normal. There is normal pulmonary artery systolic pressure. The  estimated right ventricular systolic pressure is 15.9 mmHg.   3. The mitral valve is normal in structure. Trivial mitral valve  regurgitation.   4. The aortic valve was not well visualized. Aortic valve regurgitation  is not visualized. Aortic valve sclerosis/calcification is present,  without any evidence of aortic stenosis.   5. The inferior vena cava is normal in size with greater than 50%  respiratory variability, suggesting right atrial pressure of 3 mmHg.     LHC 09/22/2021   2nd Mrg lesion is 75% stenosed.   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   Mid LM lesion is 90% stenosed.   The left ventricular systolic function is normal.   LV end diastolic pressure is mildly elevated.   The left ventricular ejection fraction is 50-55% by visual estimate.  Patient Profile     80 y.o. male CAD (nonobstructive CAD by  cath in 2017), HTN and HLD who presented to [facility] ED on 09/20/2021 for evaluation of chest pain. Found to have an NSTEMI, here with LM dx pending evaluation for CABG planned for today.  Assessment & Plan    Multivessel CAD status post CABG Diabetes mellitus Hypertension  He is status post day 1 for his CABG-LIMA to LAD, right SVG to the OM 2, SVG to PDA.   Continue current medication.  Acute changes  per CT surgery.  Appears to have good urine output.  Diabetes mellitus will be managed on sliding scale.  Blood pressure is acceptable.    For questions or updates, please contact [place] Please consult [email or web address] for contact info under Cardiology/STEMI.      Signed, Berniece Salines, DO  09/27/2021, 10:55 AM

## 2021-09-27 NOTE — Consult Note (Signed)
NAME:  William [name], MRN:  [id number], DOB:  [birth year], LOS: 6 ADMISSION DATE:  09/20/2021, CONSULTATION DATE:  09/26/21 REFERRING MD:  Kipp Brood, CHIEF COMPLAINT:  post-CABG   History of Present Illness:  William [name] is an 80 y/o gentleman with a history of CAD, HTN, HLD, DM who presented on 12/1 with CP, back pain radiating down both arms associated with SOB. Pain responded to NTG in the ED. He was diagnosed with NSTEMI and was transferred to [facility].  He underwent CABG x 3 (LIMA to LADSVG to OM, SVG to PDA) on 12/6 and was transferred to the ICU. He had VF requiring DCCV at the end of the case and was started on amiodarone infusion without a bolus.  Pertinent  Medical History  BPH CAD Nephrolothiasis HTN HLD DM  Significant Hospital Events: Including procedures, antibiotic start and stop dates in addition to other pertinent events   12/1 admitted, [place] with 3-vessel disease, recommended for CABG 12/6 CABG x 3  Interim History / Subjective:  Extubated overnight. This morning having uncontrolled nausea.  Objective   Blood pressure (!) 113/59, pulse 71, temperature 98.2 F (36.8 C), resp. rate 14, height 5\' 9"  (1.753 m), weight 103.4 kg, SpO2 93 %. CVP:  [1 mmHg-14 mmHg] 6 mmHg  Vent Mode: CPAP;PSV FiO2 (%):  [40 %-50 %] 40 % Set Rate:  [4 bmp-14 bmp] 4 bmp Vt Set:  [560 mL] 560 mL PEEP:  [5 cmH20] 5 cmH20 Pressure Support:  [5 cmH20-10 cmH20] 5 cmH20 Plateau Pressure:  [16 cmH20-17 cmH20] 16 cmH20   Intake/Output Summary (Last 24 hours) at 09/27/2021 0737 Last data filed at 09/27/2021 0600 Gross per 24 hour  Intake 5536.95 ml  Output 2495 ml  Net 3041.95 ml    Filed Weights   09/26/21 0544 09/26/21 0941 09/27/21 0500  Weight: 96.1 kg 95.7 kg 103.4 kg    Examination: General: elderly man lying in bed in NAD HENT: [place]/AT, eyes anicteric Lungs: breathing comfortably on [place], reduced basilar breath sounds Cardiovascular: s1S2, not pacing, reg rate & rhythm Abdomen: soft,  NT Extremities: mild LE edema, no cyanosis Neuro: awake but drowsy, moving all extremities GU: foley draining clear yellow urine  Na+  133 BUN 11 Cr 0.93 WBC 18.7 H/H 11.9/35.5 Platelets 142  CXR personally reviewed> mild pulm edema, chest tubes & RIJ CVC in place.  Resolved Hospital Problem list     Assessment & Plan:  CAD with 3-vessel disease, s/p CABG x 3 Intra-op VF NSTEMI -con't ASA, high intensity statin (was on pravastatin PTA) -delay plavix initiation while pacer wires are present -completed peri-op antibiotics -pain control with tramadol, oxy, morphine PRN -enteral amiodarone -initiated metoprolol when BP stable off pressors -wean off Neo, start midodrine -tele monitoring  Shock, appears hypovolemic -wean neosynephrine, start midodrine -goal MAP >65  Nausea -zofran PRN; adding phenergan  Acute respiratory failure with hypoxia, expected post-op -wean supplemental O2 as able to maintain SpO@ >90% -pulmonary hygiene  Hyperglycemia, h/o NIDDM. A1c 6.8 -con't holding home oral hypoglycemics -transition to [place] insulin> levemir 10 units BID, SSI PRN  Acute blood loss anemia, expected post-op -monitor -transfuse for Hb<7 or hemodynamically significant bleeding  Acute thrombocytopenia- consumptive -monitor  HLD -Con't zetia, high intensity statin -monitor for statin side effects, previously intolerant of atorvastatin  Former smoker -outside age range for lung cancer screening  BPH, h/o TURP in the past, no symptoms PTA -remove foley, monitor for symptoms.  Wife updated at bedside.  Best Practice (right click  and "Reselect all SmartList Selections" daily)   Diet/type: clear liquids DVT prophylaxis: SCD GI prophylaxis: H2B Lines: Central line, Arterial Line, and yes and it is still needed Foley:  Yes, and it is still needed Code Status:  full code Last date of multidisciplinary goals of care discussion [ ]   Labs   CBC: Recent Labs  Lab  09/25/21 0303 09/26/21 0441 09/26/21 1052 09/26/21 1313 09/26/21 1326 09/26/21 1522 09/26/21 1524 09/26/21 2055 09/26/21 2357 09/27/21 0209 09/27/21 0412  WBC 7.8 8.3  --   --   --  18.3*  --  17.3*  --   --  18.7*  HGB 14.3 14.7   < > 10.6*   < > 12.4* 11.9* 12.4* 11.6* 10.9* 11.9*  HCT 43.0 43.5   < > 30.6*   < > 36.2* 35.0* 35.9* 34.0* 32.0* 35.5*  MCV 90.7 90.8  --   --   --  91.2  --  90.0  --   --  90.8  PLT 146* 161  --  136*  --  121*  --  130*  --   --  142*   < > = values in this interval not displayed.     Basic Metabolic Panel: Recent Labs  Lab 09/25/21 1204 09/26/21 0441 09/26/21 1052 09/26/21 1257 09/26/21 1312 09/26/21 1414 09/26/21 1524 09/26/21 1639 09/26/21 2055 09/26/21 2357 09/27/21 0209 09/27/21 0412  NA 136 137   < > 138   < > 138   < > 135 135 140 140 133*  K 3.7 3.7   < > 4.9   < > 4.4   < > 3.9 3.8 3.8 3.9 4.1  CL 106 108   < > 105  --  104  --  106 107  --   --  104  CO2 25 22  --   --   --   --   --  22 21*  --   --  21*  GLUCOSE 168* 146*   < > 119*  --  138*  --  161* 161*  --   --  152*  BUN 14 14   < > 12  --  11  --  9 11  --   --  11  CREATININE 1.12 1.08   < > 0.80  --  0.80  --  0.97 0.98  --   --  0.93  CALCIUM 9.1 9.0  --   --   --   --   --  8.4* 8.4*  --   --  8.5*  MG  --   --   --   --   --   --   --  2.7* 2.8*  --   --  2.5*   < > = values in this interval not displayed.    GFR: Estimated Creatinine Clearance: 73.8 mL/min (by C-G formula based on SCr of 0.93 mg/dL). Recent Labs  Lab 09/26/21 0441 09/26/21 1522 09/26/21 2055 09/27/21 0412  WBC 8.3 18.3* 17.3* 18.7*     Liver Function Tests: Recent Labs  Lab 09/25/21 1204  AST 23  ALT 31  ALKPHOS 55  BILITOT 0.7  PROT 6.3*  ALBUMIN 3.4*    No results for input(s): LIPASE, AMYLASE in the last 168 hours. No results for input(s): AMMONIA in the last 168 hours.  ABG    Component Value Date/Time   PHART 7.318 (L) 09/27/2021 0209   PCO2ART 39.8 09/27/2021  0209  PO2ART 85 09/27/2021 0209   HCO3 20.5 09/27/2021 0209   TCO2 22 09/27/2021 0209   ACIDBASEDEF 5.0 (H) 09/27/2021 0209   O2SAT 96.0 09/27/2021 0209      Coagulation Profile: Recent Labs  Lab 09/25/21 1204 09/26/21 1522  INR 1.1 1.3*     This patient is critically ill with multiple organ system failure which requires frequent high complexity decision making, assessment, support, evaluation, and titration of therapies. This was completed through the application of advanced monitoring technologies and extensive interpretation of multiple databases. During this encounter critical care time was devoted to patient care services described in this note for 40 minutes.  Julian Hy, DO 09/27/21 2:50 PM [facility]

## 2021-09-27 NOTE — Progress Notes (Signed)
Inpatient Diabetes Program Recommendations  AACE/ADA: New Consensus Statement on Inpatient Glycemic Control (2015)  Target Ranges:  Prepandial:   less than 140 mg/dL      Peak postprandial:   less than 180 mg/dL (1-2 hours)      Critically ill patients:  140 - 180 mg/dL   Lab Results  Component Value Date   GLUCAP 151 (H) 09/27/2021   HGBA1C 6.8 (H) 09/21/2021    Review of Glycemic Control  Latest Reference Range & Units 09/27/21 03:04 09/27/21 04:10 09/27/21 05:10 09/27/21 06:49 09/27/21 08:49  Glucose-Capillary 70 - 99 mg/dL 165 (H) 139 (H) 140 (H) 138 (H) 151 (H)   Diabetes history: DM 2 Outpatient Diabetes medications:  Glucotrol 5 mg q AM Current orders for Inpatient glycemic control:  TCTS q 4 hours Levemir 10 units bid  Inpatient Diabetes Program Recommendations:    Agree with current orders.  At D/C may need to d/c Glucotrol due to risk for hypoglycemia.  May be candidate for SGLT-2 especially with cardiac history?  Thanks,  Adah Perl, RN, BC-ADM Inpatient Diabetes Coordinator Pager [phone]  (8a-5p)

## 2021-09-27 NOTE — Hospital Course (Addendum)
   History of Present Illness:      [name] is a very pleasant 80 year old gentleman with known coronary artery disease dating back to 2017.  He has been managed medically with aspirin, metoprolol, and pravastatin.  He also has a history of hypertension, dyslipidemia, type 2 diabetes mellitus and benign prostatic hyperplasia.  He was brought to [facility] yesterday by way of EMS after experiencing chest tightness that radiated to both arms and into his back.  His wife also reported that he had an episode of confusion, memory loss, and posterior headache this past weekend.  The symptoms resolved after lasting for about 45 minutes.     Evaluation in the emergency room included an EKG that showed possible inferior infarct.  High-sensitivity troponin was initially 18 and trended up to 125 by yesterday afternoon and was 812 by midnight last night.  Chest x-ray showed no acute abnormalities.  His chest pain subsided after receiving sublingual nitroglycerin.  He was admitted to [facility] and started on a heparin infusion.  He remained stable and was transferred to [facility] today for further evaluation and possible intervention.  An MRI of his brain obtained earlier today showed no acute abnormality.  He went on to have left heart catheterization this afternoon.  This demonstrates left main and multivessel coronary artery disease.  Specifically, there is a 90% mid left main coronary artery stenosis.  There is a 75% stenosis in the mid segment of OM 2.  There is also a 90% stenosis in the midportion of the RPDA.  Left ventricular ejection fraction was 50 to 55% by visual estimate on left ventriculogram.   [name] has remained stable since admission and denies any further pain.  He does say he has some occasional sensation of tightness in his chest.  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  He considers himself pretty healthy overall and  he said he and his wife live a fairly active lifestyle.  He has had 2 back surgeries and has had rotator cuff repair on both shoulders.  He is also status post transurethral resection of the prostate and cholecystectomy.  He is edentulous and has full upper and lower plate dentures.   Course in Hospital: [name] remained stable following left heart catheterization.  He decided to proceed with surgery.  He was taken to the operating room on 09/26/2021 where CABG x3 was carried out.  The left internal mammary artery was grafted to the left anterior descending coronary artery and saphenous vein grafts were placed to OM 2 and to the PDA.  Following the procedure, he separated from cardiopulmonary bypass without difficulty and was transferred to the surgical ICU in stable condition.  The critical care team was consulted to assist with early postop management.  [name] was weaned from mechanical ventilation and extubated at about midnight on the day of surgery.  Glucose was monitored carefully and sliding scale insulin provided as required.  His renal function has been stable.  He has been started on Plavix following epicardial pacer wire removal.  Chest tubes were removed on postop day #2.  Diet is advanced in a routine manner.  He does have an expected acute blood loss anemia which is stable.  Additionally, on postop day #2 he was transferred to the floor for ongoing routine rehabilitation.

## 2021-09-27 NOTE — Progress Notes (Addendum)
TCTS DAILY ICU PROGRESS NOTE                   [address]            [place] [address]          [phone]   1 Day Post-Op Procedure(s) (LRB): CORONARY ARTERY BYPASS GRAFTING (CABG) X 3 , USING LEFT INTERNAL MAMMARY ARTERY & RIGHT LEG GREATER SAPHENOUS VEIN HARVESTED ENDOSCOPICALLY (N/A) TRANSESOPHAGEAL ECHOCARDIOGRAM (TEE) (N/A) ENDOVEIN HARVEST OF GREATER SAPHENOUS VEIN (Right) APPLICATION OF CELL SAVER  Total Length of Stay:  LOS: 6 days   Subjective: Some nausea  Objective: Vital signs in last 24 hours: Temp:  [96.3 F (35.7 C)-98.2 F (36.8 C)] 98.2 F (36.8 C) (12/07 0645) Pulse Rate:  [57-76] 71 (12/07 0000) Cardiac Rhythm: Normal sinus rhythm (12/07 0400) Resp:  [12-25] 14 (12/07 0645) BP: (98-143)/(57-105) 113/59 (12/07 0500) SpO2:  [91 %-99 %] 93 % (12/07 0645) Arterial Line BP: (87-179)/(40-69) 133/43 (12/07 0645) FiO2 (%):  [40 %-50 %] 40 % (12/06 2335) Weight:  [95.7 kg-103.4 kg] 103.4 kg (12/07 0500)  Filed Weights   09/26/21 0544 09/26/21 0941 09/27/21 0500  Weight: 96.1 kg 95.7 kg 103.4 kg    Weight change: -0.363 kg   Hemodynamic parameters for last 24 hours: CVP:  [1 mmHg-14 mmHg] 6 mmHg  Intake/Output from previous day: 12/06 0701 - 12/07 0700 In: 5537 [I.V.:3971.4; Blood:413; IV Piggyback:1152.6] Out: 9373 [Urine:1570; Blood:650; Chest Tube:275]  Intake/Output this shift: No intake/output data recorded.  Current Meds: Scheduled Meds:  acetaminophen  1,000 mg Oral Q6H   Or   acetaminophen (TYLENOL) oral liquid 160 mg/5 mL  1,000 mg Per Tube Q6H   aspirin EC  325 mg Oral Daily   Or   aspirin  324 mg Per Tube Daily   bisacodyl  10 mg Oral Daily   Or   bisacodyl  10 mg Rectal Daily   chlorhexidine gluconate (MEDLINE KIT)  15 mL Mouth Rinse BID   Chlorhexidine Gluconate Cloth  6 each Topical Daily   docusate sodium  200 mg Oral Daily   mouth rinse  15 mL Mouth Rinse 10 times per day   metoprolol tartrate  12.5 mg  Oral BID   Or   metoprolol tartrate  12.5 mg Per Tube BID   [START ON 09/28/2021] pantoprazole  40 mg Oral Daily   rosuvastatin  20 mg Oral Daily   sodium chloride flush  10-40 mL Intracatheter Q12H   sodium chloride flush  3 mL Intravenous Q12H   Continuous Infusions:  sodium chloride     sodium chloride     sodium chloride     albumin human Stopped (09/26/21 2003)   amiodarone 30 mg/hr (09/27/21 0600)    ceFAZolin (ANCEF) IV Stopped (09/27/21 0120)   dexmedetomidine (PRECEDEX) IV infusion Stopped (09/26/21 1755)   insulin 3 Units/hr (09/27/21 0600)   lactated ringers     lactated ringers     lactated ringers 20 mL/hr at 09/27/21 0600   nitroGLYCERIN 0 mcg/min (09/26/21 1455)   norepinephrine (LEVOPHED) Adult infusion Stopped (09/26/21 1635)   phenylephrine (NEO-SYNEPHRINE) Adult infusion 16 mcg/min (09/27/21 0600)   PRN Meds:.sodium chloride, albumin human, dextrose, lactated ringers, metoprolol tartrate, midazolam, morphine injection, ondansetron (ZOFRAN) IV, oxyCODONE, sodium chloride flush, sodium chloride flush, traMADol  General appearance: alert, cooperative, and no distress Heart: regular rate and rhythm Lungs: clear to auscultation bilaterally Abdomen: benign Extremities: min edema Wound: dressings CDI  Lab  Results: CBC: Recent Labs    09/26/21 2055 09/26/21 2357 09/27/21 0209 09/27/21 0412  WBC 17.3*  --   --  18.7*  HGB 12.4*   < > 10.9* 11.9*  HCT 35.9*   < > 32.0* 35.5*  PLT 130*  --   --  142*   < > = values in this interval not displayed.   BMET:  Recent Labs    09/26/21 2055 09/26/21 2357 09/27/21 0209 09/27/21 0412  NA 135   < > 140 133*  K 3.8   < > 3.9 4.1  CL 107  --   --  104  CO2 21*  --   --  21*  GLUCOSE 161*  --   --  152*  BUN 11  --   --  11  CREATININE 0.98  --   --  0.93  CALCIUM 8.4*  --   --  8.5*   < > = values in this interval not displayed.    CMET: Lab Results  Component Value Date   WBC 18.7 (H) 09/27/2021   HGB  11.9 (L) 09/27/2021   HCT 35.5 (L) 09/27/2021   PLT 142 (L) 09/27/2021   GLUCOSE 152 (H) 09/27/2021   CHOL 139 09/21/2021   TRIG 142 09/21/2021   HDL 32 (L) 09/21/2021   LDLCALC 79 09/21/2021   ALT 31 09/25/2021   AST 23 09/25/2021   NA 133 (L) 09/27/2021   K 4.1 09/27/2021   CL 104 09/27/2021   CREATININE 0.93 09/27/2021   BUN 11 09/27/2021   CO2 21 (L) 09/27/2021   INR 1.3 (H) 09/26/2021   HGBA1C 6.8 (H) 09/21/2021      PT/INR:  Recent Labs    09/26/21 1522  LABPROT 15.9*  INR 1.3*   Radiology: [facility] Chest Port 1 View  Result Date: 09/26/2021 CLINICAL DATA:  Status post CABG EXAM: PORTABLE CHEST 1 VIEW COMPARISON:  09/20/2021 FINDINGS: Status post interval median sternotomy and CABG. Support apparatus includes endotracheal tube, esophagogastric tube, right neck vascular catheter, and mediastinal and left chest tubes. Esophagogastric tube tip is below the diaphragm, side port is at the level of the gastroesophageal junction. Recommend slight advancement. Small bilateral pleural effusions. IMPRESSION: 1. Status post median sternotomy and CABG. 2. Esophagogastric tube tip is below the diaphragm, side port is at the level of the gastroesophageal junction. Recommend slight advancement. 3. Support apparatus is otherwise satisfactory. 4. Small bilateral pleural effusions. Electronically Signed   By: Delanna Ahmadi M.D.   On: 09/26/2021 15:16   ECHO INTRAOPERATIVE TEE  Result Date: 09/26/2021  *INTRAOPERATIVE TRANSESOPHAGEAL REPORT *  Patient Name:   William [name] [facility] Date of Exam: 09/26/2021 Medical Rec #:  [id number]        Height:       69.0 in Accession #:    [id number]       Weight:       211.0 lb Date of Birth:  07/15/1941       BSA:          2.11 m Patient Age:    80 years         BP:           118/76 mmHg Patient Gender: M                HR:           66 bpm. Exam Location:  Inpatient Transesophogeal exam was perform intraoperatively during surgical procedure. Patient was closely  monitored under general anesthesia during the entirety of examination. Indications:     CABG Sonographer:     Merrie Roof RDCS Performing Phys: [id number] Lucile Crater Kasen Sako Diagnosing Phys: Annye Asa MD Complications: No known complications during this procedure. POST-OP IMPRESSIONS Limited post-CPB exam: The patient separated easily from CPB. _ Left Ventricle: The left ventricular function improved with time off of CPB, and is unchanged from pre-bypass function. There are no regional wall motion abnormalities. Overall EF 55-60%. _ Aortic Valve: The aortic valve function appears unchanged from pre-bypass images. _ Mitral Valve: Initially the patient had moderate, central MR with separation from CPB. This improved with time off of pump. Only mild MR was seen by the end of surgery. _ Tricuspid Valve: The tricuspid valve function appears unchanged from pre-bypass images. PRE-OP FINDINGS  Left Ventricle: The left ventricle has hyperdynamic systolic function, with an ejection fraction of >65%, measured 67%. The cavity size was normal. No evidence of left ventricular regional wall motion abnormalities. There is no left ventricular hypertrophy. Left ventricular diastolic function was not evaluated. Right Ventricle: The right ventricle has normal systolic function. The cavity was normal. There is no increase in right ventricular wall thickness. Left Atrium: Left atrial size was normal in size. No left atrial/left atrial appendage thrombus was detected. Left atrial appendage velocity is normal at greater than 40 cm/s. Right Atrium: Right atrial size was normal in size. Interatrial Septum: No atrial level shunt detected by color flow Doppler. There is no evidence of a patent foramen ovale. Pericardium: There is no evidence of pericardial effusion. Mitral Valve: The mitral valve is normal in structure. Mitral valve regurgitation is trivial by color flow Doppler. There is no evidence of mitral valve vegetation. Pulmonary  venous flow is normal. There is no evidence of mitral stenosis, with peak gradient 3 mmHg. Tricuspid Valve: The tricuspid valve was normal in structure. Tricuspid valve regurgitation is trivial by color flow Doppler. No evidence of tricuspid stenosis is present. There is no evidence of tricuspid valve vegetation. Aortic Valve: The aortic valve is tricuspid. Aortic valve regurgitation was not visualized by color flow Doppler. There is no stenosis of the aortic valve, with peak gradient 10 mmHg, mean gradient 5 mmHg. There is no evidence of aortic valve vegetation. Pulmonic Valve: The pulmonic valve was normal in structure, with normal leaflet excursion. No evidence of pumonic stenosis. Pulmonic valve regurgitation is not visualized by color flow Doppler. Aorta: The aortic root and ascending aorta are normal in size and structure. The aortic arch was not well visualized. There is evidence of scattered plaque in the descending aorta; Grade III, measuring 3-40mm in size. Pulmonary Artery: The pulmonary artery is of normal size. Venous: The inferior vena cava is normal in size with greater than 50% respiratory variability, suggesting right atrial pressure of 3 mmHg. Shunts: There is no evidence of an atrial septal defect.  Annye Asa MD Electronically signed by Annye Asa MD Signature Date/Time: 09/26/2021/5:10:26 PM    Final      Assessment/Plan: S/P Procedure(s) (LRB): CORONARY ARTERY BYPASS GRAFTING (CABG) X 3 , USING LEFT INTERNAL MAMMARY ARTERY & RIGHT LEG GREATER SAPHENOUS VEIN HARVESTED ENDOSCOPICALLY (N/A) TRANSESOPHAGEAL ECHOCARDIOGRAM (TEE) (N/A) ENDOVEIN HARVEST OF GREATER SAPHENOUS VEIN (Right) APPLICATION OF CELL SAVER POD#1  1 afeb, VSS  s bp 87-179(aline), no current vasopressor/inotropes,+ amio gtt. CO/CI good, sinus rhythm 2 sats good on 4 liters 3 CVP 5-7 4 good UOP, weight >8 kg if accurate, will need diuresis 5 CT 275 cc-  poss d/c later today 6 normal renal fxn 7 minor  expected ABLA - monitor 8 reactive leukocytosis- monitor 9 minor thrombocytopenia- monitor 10 BS adeq controlled 11 CXR clear lung fields 12 routine pulm hygiene/cardiac rehab  John Giovanni PA-C Pager [phone] 09/27/2021 7:33 AM   No events Doing well Will add phenergan for nausea Will keep wires today Wean neo  Madalyne Husk O Bindu Docter

## 2021-09-28 ENCOUNTER — Inpatient Hospital Stay (HOSPITAL_COMMUNITY): Payer: Medicare Other

## 2021-09-28 DIAGNOSIS — R110 Nausea: Secondary | ICD-10-CM

## 2021-09-28 DIAGNOSIS — I200 Unstable angina: Secondary | ICD-10-CM | POA: Diagnosis not present

## 2021-09-28 DIAGNOSIS — Z951 Presence of aortocoronary bypass graft: Secondary | ICD-10-CM | POA: Diagnosis not present

## 2021-09-28 LAB — GLUCOSE, CAPILLARY
Glucose-Capillary: 122 mg/dL — ABNORMAL HIGH (ref 70–99)
Glucose-Capillary: 128 mg/dL — ABNORMAL HIGH (ref 70–99)
Glucose-Capillary: 138 mg/dL — ABNORMAL HIGH (ref 70–99)
Glucose-Capillary: 146 mg/dL — ABNORMAL HIGH (ref 70–99)
Glucose-Capillary: 154 mg/dL — ABNORMAL HIGH (ref 70–99)
Glucose-Capillary: 158 mg/dL — ABNORMAL HIGH (ref 70–99)
Glucose-Capillary: 163 mg/dL — ABNORMAL HIGH (ref 70–99)

## 2021-09-28 LAB — BASIC METABOLIC PANEL
Anion gap: 5 (ref 5–15)
BUN: 14 mg/dL (ref 8–23)
CO2: 24 mmol/L (ref 22–32)
Calcium: 8.6 mg/dL — ABNORMAL LOW (ref 8.9–10.3)
Chloride: 102 mmol/L (ref 98–111)
Creatinine, Ser: 1.2 mg/dL (ref 0.61–1.24)
GFR, Estimated: >60 mL/min (ref >60)
Glucose, Bld: 147 mg/dL — ABNORMAL HIGH (ref 70–99)
Potassium: 4.5 mmol/L (ref 3.5–5.1)
Sodium: 131 mmol/L — ABNORMAL LOW (ref 135–145)

## 2021-09-28 LAB — CBC
HCT: 34.5 % — ABNORMAL LOW (ref 39.0–52.0)
Hemoglobin: 11.3 g/dL — ABNORMAL LOW (ref 13.0–17.0)
MCH: 30.7 pg (ref 26.0–34.0)
MCHC: 32.8 g/dL (ref 30.0–36.0)
MCV: 93.8 fL (ref 80.0–100.0)
Platelets: 131 10*3/uL — ABNORMAL LOW (ref 150–400)
RBC: 3.68 MIL/uL — ABNORMAL LOW (ref 4.22–5.81)
RDW: 14.3 % (ref 11.5–15.5)
WBC: 18 10*3/uL — ABNORMAL HIGH (ref 4.0–10.5)
nRBC: 0 % (ref 0.0–0.2)

## 2021-09-28 MED ORDER — ASPIRIN EC 81 MG PO TBEC
81.0000 mg | DELAYED_RELEASE_TABLET | Freq: Every day | ORAL | Status: DC
  Administered 2021-09-28 – 2021-10-01 (×4): 81 mg via ORAL
  Filled 2021-09-28 (×4): qty 1

## 2021-09-28 MED ORDER — ORAL CARE MOUTH RINSE
15.0000 mL | Freq: Two times a day (BID) | OROMUCOSAL | Status: DC
  Administered 2021-09-28 – 2021-09-29 (×5): 15 mL via OROMUCOSAL

## 2021-09-28 MED ORDER — SODIUM CHLORIDE 0.9% FLUSH
3.0000 mL | Freq: Two times a day (BID) | INTRAVENOUS | Status: DC
  Administered 2021-09-28 (×2): 3 mL via INTRAVENOUS

## 2021-09-28 MED ORDER — CLOPIDOGREL BISULFATE 75 MG PO TABS
75.0000 mg | ORAL_TABLET | Freq: Every day | ORAL | Status: DC
  Administered 2021-09-28 – 2021-10-01 (×4): 75 mg via ORAL
  Filled 2021-09-28 (×4): qty 1

## 2021-09-28 MED ORDER — SODIUM CHLORIDE 0.9% FLUSH: 3.0000 mL | INTRAVENOUS | Status: DC | PRN

## 2021-09-28 MED ORDER — ~~LOC~~ CARDIAC SURGERY, PATIENT & FAMILY EDUCATION: Freq: Once | Status: AC

## 2021-09-28 MED ORDER — SODIUM CHLORIDE 0.9 % IV SOLN: 250.0000 mL | INTRAVENOUS | Status: DC | PRN

## 2021-09-28 MED FILL — Potassium Chloride Inj 2 mEq/ML: INTRAVENOUS | Qty: 40 | Status: AC

## 2021-09-28 MED FILL — Lidocaine HCl Local Preservative Free (PF) Inj 2%: INTRAMUSCULAR | Qty: 15 | Status: AC

## 2021-09-28 MED FILL — Heparin Sodium (Porcine) Inj 1000 Unit/ML: Qty: 1000 | Status: AC

## 2021-09-28 NOTE — Progress Notes (Signed)
Progress Note  Patient Name: William [name] [facility] Date of Encounter: 09/28/2021  Primary Cardiologist: Sinclair Grooms, MD   Subjective   Patient was seen and examined this morning at his bedside.  His wife and daughter were both at the bedside.  He is postop day 2.  He tells me he is a bit nauseous today but no other complaints.  Inpatient Medications    Scheduled Meds:  acetaminophen  1,000 mg Oral Q6H   Or   acetaminophen (TYLENOL) oral liquid 160 mg/5 mL  1,000 mg Per Tube Q6H   amiodarone  400 mg Oral BID   aspirin EC  81 mg Oral Daily   bisacodyl  10 mg Oral Daily   Or   bisacodyl  10 mg Rectal Daily   chlorhexidine gluconate (MEDLINE KIT)  15 mL Mouth Rinse BID   Chlorhexidine Gluconate Cloth  6 each Topical Daily   clopidogrel  75 mg Oral Daily   docusate sodium  200 mg Oral Daily   enoxaparin (LOVENOX) injection  40 mg Subcutaneous QHS   ezetimibe  10 mg Oral Daily   insulin aspart  0-24 Units Subcutaneous Q4H   insulin detemir  10 Units Subcutaneous BID   mouth rinse  15 mL Mouth Rinse BID   metoprolol tartrate  12.5 mg Oral BID   midodrine  5 mg Oral TID WC   pantoprazole  40 mg Oral Daily   rosuvastatin  20 mg Oral Daily   sodium chloride flush  10-40 mL Intracatheter Q12H   sodium chloride flush  3 mL Intravenous Q12H   sodium chloride flush  3 mL Intravenous Q12H   Continuous Infusions:  sodium chloride     sodium chloride     sodium chloride     sodium chloride     dexmedetomidine (PRECEDEX) IV infusion Stopped (09/26/21 1755)   lactated ringers     lactated ringers     lactated ringers Stopped (09/27/21 1654)   promethazine (PHENERGAN) injection (IM or IVPB) Stopped (09/27/21 1400)   PRN Meds: sodium chloride, sodium chloride, dextrose, lactated ringers, metoprolol tartrate, midazolam, morphine injection, ondansetron (ZOFRAN) IV, oxyCODONE, promethazine (PHENERGAN) injection (IM or IVPB), sodium chloride flush, sodium chloride flush, sodium chloride  flush, traMADol   Vital Signs    Vitals:   09/28/21 0900 09/28/21 0922 09/28/21 1000 09/28/21 1100  BP: (!) 108/59 (!) 108/59 (!) 108/59 106/62  Pulse:  80    Resp: $Remo'18  20 19  'VnOZm$ Temp:      TempSrc:      SpO2:    97%  Weight:      Height:        Intake/Output Summary (Last 24 hours) at 09/28/2021 1159 Last data filed at 09/28/2021 1100 Gross per 24 hour  Intake 628.93 ml  Output 255 ml  Net 373.93 ml   Filed Weights   09/26/21 0941 09/27/21 0500 09/28/21 0500  Weight: 95.7 kg 103.4 kg 101.6 kg    Telemetry    Sinus rhythm- Personally Reviewed  ECG    None today- Personally Reviewed  Physical Exam   GEN: No acute distress.   Neck: No JVD, right IJ in place. Cardiac: RRR, no murmurs, rubs, or gallops.  Respiratory: Clear to auscultation bilaterally. GI: Soft, nontender, non-distended  MS: No edema; No deformity. Neuro:  Nonfocal  Psych: Normal affect    Labs    Chemistry Recent Labs  Lab 09/25/21 1204 09/26/21 0441 09/27/21 0412 09/27/21 1617 09/28/21 0345  NA 136   < > 133* 134* 131*  K 3.7   < > 4.1 4.3 4.5  CL 106   < > 104 103 102  CO2 25   < > 21* 22 24  GLUCOSE 168*   < > 152* 150* 147*  BUN 14   < > $R'11 12 14  'rp$ CREATININE 1.12   < > 0.93 1.08 1.20  CALCIUM 9.1   < > 8.5* 8.8* 8.6*  PROT 6.3*  --   --   --   --   ALBUMIN 3.4*  --   --   --   --   AST 23  --   --   --   --   ALT 31  --   --   --   --   ALKPHOS 55  --   --   --   --   BILITOT 0.7  --   --   --   --   GFRNONAA >60   < > >60 >60 >60  ANIONGAP 5   < > $R'8 9 5   'uJ$ < > = values in this interval not displayed.     Hematology Recent Labs  Lab 09/27/21 0412 09/27/21 1617 09/28/21 0345  WBC 18.7* 19.6* 18.0*  RBC 3.91* 3.74* 3.68*  HGB 11.9* 11.5* 11.3*  HCT 35.5* 35.0* 34.5*  MCV 90.8 93.6 93.8  MCH 30.4 30.7 30.7  MCHC 33.5 32.9 32.8  RDW 13.2 13.8 14.3  PLT 142* 135* 131*    Cardiac EnzymesNo results for input(s): TROPONINI in the last 168 hours. No results for input(s):  TROPIPOC in the last 168 hours.   BNPNo results for input(s): BNP, PROBNP in the last 168 hours.   DDimer No results for input(s): DDIMER in the last 168 hours.   Radiology    DG Chest Port 1 View  Result Date: 09/28/2021 CLINICAL DATA:  Pleural effusion, open heart surgery, chest tube EXAM: PORTABLE CHEST 1 VIEW COMPARISON:  Chest radiograph 1 day prior FINDINGS: The right IJ vascular catheter is in stable position terminating in the lower SVC. A mediastinal drain and presumed left chest tube are stable. Median sternotomy wires are stable. Multiple additional leads/wires overlie the left chest/abdomen cough the heart is enlarged and the mediastinal contours are prominent, unchanged. There is a small left pleural effusion with adjacent retrocardiac opacity, unchanged. There is no other focal consolidation. There is no overt pulmonary edema. There is no right pleural effusion. There is no pneumothorax. IMPRESSION: Unchanged small left pleural effusion with adjacent retrocardiac opacity. No new or worsening focal airspace disease. Electronically Signed   By: Valetta Mole M.D.   On: 09/28/2021 08:37   DG Chest Port 1 View  Result Date: 09/27/2021 CLINICAL DATA:  Status post CABG, sore chest EXAM: PORTABLE CHEST 1 VIEW COMPARISON:  Chest radiograph 1 day prior FINDINGS: The endotracheal tube has been removed. The enteric catheter has been removed. A right IJ central venous catheter is stable. The mediastinal drain and left basilar chest tube are stable. The heart and mediastinum are prominent, unchanged. Probable trace bilateral pleural effusions are not significantly changed. There is no new or worsening airspace disease. There is no pneumothorax. There is no acute osseous abnormality. IMPRESSION: 1. Interval extubation and removal of the enteric catheter. Remaining support devices as above. 2. Trace bilateral pleural effusions, unchanged. No new or worsening airspace disease. Electronically Signed   By:  Valetta Mole M.D.   On: 09/27/2021 08:46  DG Chest Port 1 View  Result Date: 09/26/2021 CLINICAL DATA:  Status post CABG EXAM: PORTABLE CHEST 1 VIEW COMPARISON:  09/20/2021 FINDINGS: Status post interval median sternotomy and CABG. Support apparatus includes endotracheal tube, esophagogastric tube, right neck vascular catheter, and mediastinal and left chest tubes. Esophagogastric tube tip is below the diaphragm, side port is at the level of the gastroesophageal junction. Recommend slight advancement. Small bilateral pleural effusions. IMPRESSION: 1. Status post median sternotomy and CABG. 2. Esophagogastric tube tip is below the diaphragm, side port is at the level of the gastroesophageal junction. Recommend slight advancement. 3. Support apparatus is otherwise satisfactory. 4. Small bilateral pleural effusions. Electronically Signed   By: Delanna Ahmadi M.D.   On: 09/26/2021 15:16    Cardiac Studies   TTE 09/22/21  1. Left ventricular ejection fraction, by estimation, is 60 to 65%. The  left ventricle has normal function. The left ventricle has no regional  wall motion abnormalities. There is mild left ventricular hypertrophy.  Left ventricular diastolic parameters  are consistent with Grade I diastolic dysfunction (impaired relaxation).   2. Right ventricular systolic function is normal. The right ventricular  size is normal. There is normal pulmonary artery systolic pressure. The  estimated right ventricular systolic pressure is 46.2 mmHg.   3. The mitral valve is normal in structure. Trivial mitral valve  regurgitation.   4. The aortic valve was not well visualized. Aortic valve regurgitation  is not visualized. Aortic valve sclerosis/calcification is present,  without any evidence of aortic stenosis.   5. The inferior vena cava is normal in size with greater than 50%  respiratory variability, suggesting right atrial pressure of 3 mmHg.     LHC 09/22/2021   2nd Mrg lesion is 75%  stenosed.   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   Mid LM lesion is 90% stenosed.   The left ventricular systolic function is normal.   LV end diastolic pressure is mildly elevated.   The left ventricular ejection fraction is 50-55% by visual estimate.  Patient Profile     80 y.o. male CAD (nonobstructive CAD by cath in 2017), HTN and HLD who presented to [facility] ED on 09/20/2021 for evaluation of chest pain. Found to have an NSTEMI, here with LM dx pending evaluation for CABG planned for today.  Assessment & Plan    Multivessel CAD status post CABG Diabetes mellitus Hypertension  He is status post day 2 for his CABG-LIMA to LAD, right SVG to the OM 2, SVG to PDA.   Continue current medication aspirin 81 mg daily, Crestor 20 mg daily, amiodarone 400 mg twice daily. Chest tubes and pacer wires all al appears to be doing well.  Acute changes per CT surgery.  Appears to have good urine output.  Diabetes mellitus managed per primary team.  On midodrine, blood pressure which seems to be acceptable. Continue low-dose beta-blocker for now.  For questions or updates, please contact [place] Please consult [email or web address] for contact info under Cardiology/STEMI.      Signed, Judith Campillo, DO  09/28/2021, 11:59 AM

## 2021-09-28 NOTE — Progress Notes (Signed)
Patient ID: [facility], male   DOB: Apr 11, 1941, 80 y.o.   MRN: [id number] TCTS Evening Rounds:  Hemodynamically stable in sinus rhythm.  Sats 92% RA sleeping.  UO ok

## 2021-09-28 NOTE — Progress Notes (Signed)
      [address]       [place] [address]             [phone]                 2 Days Post-Op Procedure(s) (LRB): CORONARY ARTERY BYPASS GRAFTING (CABG) X 3 , USING LEFT INTERNAL MAMMARY ARTERY & RIGHT LEG GREATER SAPHENOUS VEIN HARVESTED ENDOSCOPICALLY (N/A) TRANSESOPHAGEAL ECHOCARDIOGRAM (TEE) (N/A) ENDOVEIN HARVEST OF GREATER SAPHENOUS VEIN (Right) APPLICATION OF CELL SAVER   Events: No events Continue to complain of nausea _______________________________________________________________ Vitals: BP 121/69   Pulse 93   Temp 98.7 F (37.1 C) (Oral)   Resp 18   Ht 5\' 9"  (1.753 m)   Wt 101.6 kg   SpO2 97%   BMI 33.08 kg/m  Filed Weights   09/26/21 0941 09/27/21 0500 09/28/21 0500  Weight: 95.7 kg 103.4 kg 101.6 kg     - Neuro: alert NAD  - Cardiovascular: sinus  Drips: none.   CVP:  [4 mmHg-11 mmHg] 9 mmHg  - Pulm: EWOB    ABG    Component Value Date/Time   PHART 7.318 (L) 09/27/2021 0209   PCO2ART 39.8 09/27/2021 0209   PO2ART 85 09/27/2021 0209   HCO3 20.5 09/27/2021 0209   TCO2 22 09/27/2021 0209   ACIDBASEDEF 5.0 (H) 09/27/2021 0209   O2SAT 96.0 09/27/2021 0209    - Abd: ND - Extremity: trace edema  .Intake/Output      12/07 0701 12/08 0700 12/08 0701 12/09 0700   P.O. 360    I.V. (mL/kg) 419.5 (4.1)    Blood     IV Piggyback 590.6    Total Intake(mL/kg) 1370.2 (13.5)    Urine (mL/kg/hr) 305 (0.1)    Blood     Chest Tube 130    Total Output 435    Net +935.2            _______________________________________________________________ Labs: CBC Latest Ref Rng & Units 09/28/2021 09/27/2021 09/27/2021  WBC 4.0 - 10.5 K/uL 18.0(H) 19.6(H) 18.7(H)  Hemoglobin 13.0 - 17.0 g/dL 11.3(L) 11.5(L) 11.9(L)  Hematocrit 39.0 - 52.0 % 34.5(L) 35.0(L) 35.5(L)  Platelets 150 - 400 K/uL 131(L) 135(L) 142(L)   CMP Latest Ref Rng & Units 09/28/2021 09/27/2021 09/27/2021  Glucose 70 - 99 mg/dL 147(H) 150(H) 152(H)  BUN 8 - 23 mg/dL 14  12 11   Creatinine 0.61 - 1.24 mg/dL 1.20 1.08 0.93  Sodium 135 - 145 mmol/L 131(L) 134(L) 133(L)  Potassium 3.5 - 5.1 mmol/L 4.5 4.3 4.1  Chloride 98 - 111 mmol/L 102 103 104  CO2 22 - 32 mmol/L 24 22 21(L)  Calcium 8.9 - 10.3 mg/dL 8.6(L) 8.8(L) 8.5(L)  Total Protein 6.5 - 8.1 g/dL - - -  Total Bilirubin 0.3 - 1.2 mg/dL - - -  Alkaline Phos 38 - 126 U/L - - -  AST 15 - 41 U/L - - -  ALT 0 - 44 U/L - - -    CXR: clear  _______________________________________________________________  Assessment and Plan: POD 2 s/p CABG  Neuro: pain controlled. CV: on A/S/BB.  Will start plavix today after wires removed Pulm: will remove CTs Renal: creat stable.  Will diurese once BP up more GI: on diet. Heme: stable ID: [email or web address] Endo: SSI Dispo: floor   Nellie Chevalier O Amylee Lodato 09/28/2021 8:15 AM

## 2021-09-28 NOTE — Progress Notes (Signed)
NAME:  William [name], MRN:  [id number], DOB:  13-Sep-1941, LOS: 7 ADMISSION DATE:  09/20/2021, CONSULTATION DATE:  09/26/21 REFERRING MD:  Kipp Brood, CHIEF COMPLAINT:  post-CABG   History of Present Illness:  William [name] is an 80 y/o gentleman with a history of CAD, HTN, HLD, DM who presented on 12/1 with CP, back pain radiating down both arms associated with SOB. Pain responded to NTG in the ED. He was diagnosed with NSTEMI and was transferred to [facility].  He underwent CABG x 3 (LIMA to LADSVG to OM, SVG to PDA) on 12/6 and was transferred to the ICU. He had VF requiring DCCV at the end of the case and was started on amiodarone infusion without a bolus.  Pertinent  Medical History  BPH CAD Nephrolothiasis HTN HLD DM  Significant Hospital Events: Including procedures, antibiotic start and stop dates in addition to other pertinent events   12/1 admitted, [place] with 3-vessel disease, recommended for CABG 12/6 CABG x 3  Interim History / Subjective:  Still having nausea, but able to try to eat this morning.  Objective   Blood pressure 121/69, pulse 93, temperature 98.7 F (37.1 C), temperature source Oral, resp. rate 18, height 5\' 9"  (1.753 m), weight 101.6 kg, SpO2 97 %. CVP:  [4 mmHg-11 mmHg] 9 mmHg      Intake/Output Summary (Last 24 hours) at 09/28/2021 0825 Last data filed at 09/28/2021 0700 Gross per 24 hour  Intake 1267.12 ml  Output 340 ml  Net 927.12 ml    Filed Weights   09/26/21 0941 09/27/21 0500 09/28/21 0500  Weight: 95.7 kg 103.4 kg 101.6 kg    Examination: General: elderly man sitting in the chair eating breakfast HENT: [place]/AT, eyes anicteric Lungs: breathing comfortably on [place], CTAB Cardiovascular: S1S2, RRR. Chest tubes with miniaml bloody output.  Abdomen: soft, NT Extremities: minimal leg edema, no cyanosis or clubbing Neuro: awake and more alert today, sitting up, answering questions appropriately  Na+  131 BUN 14 Cr 1.2 WBC 18.0 H/H  11.3/34.5 Platelets 131  CXR personally reviewed> small left effusion, chest tubes in place, [facility] Problem list     Assessment & Plan:  CAD with 3-vessel disease, s/p CABG x 3. LVEF 60-65% Intra-op VF NSTEMI -daily ASA, rosuvastatin- prev intolerant to atorvastatin, was on pravastatin PTA -pacing wires and chest tubes out today -start plavix after wires out -pain control with tramadol, oxy, morphine PRN -con't oral amiodarone -con't low dose metoprolol, titrate up dose as BP allows -con't midodrine -tele monitoring  Nausea -zofran and phenergan PRN  Acute respiratory failure with hypoxia, expected post-op -pulmonary hygiene, OOB mobility -wean O2 as able to maintain SpO2 >90%  Hyperglycemia, h/o NIDDM. A1c 6.8 -con't holding home oral hypoglycemics -planning to start farxiga this admission -con't SSI PRN -levemir 10 units BID  Acute blood loss anemia, expected post-op -transfuse for Hb <7 or hemodynamically significant bleeding  Acute thrombocytopenia- consumptive, stable -monitor  HLD -con't zetia, Crestor -monitor for statin side effects, previously intolerant of atorvastatin  Former smoker -outside age range for lung cancer screening  BPH, h/o TURP in the past, no symptoms PTA -foley out, not on PTA meds  No family at bedside this morning.  Best Practice (right click and "Reselect all SmartList Selections" daily)   Diet/type: Regular consistency (see orders) DVT prophylaxis: SCD GI prophylaxis: H2B Lines: Central line and No longer needed.  Order written to d/c  Foley:  Yes, and it is still  needed Code Status:  full code Last date of multidisciplinary goals of care discussion [ ]   Labs   CBC: Recent Labs  Lab 09/26/21 1522 09/26/21 1524 09/26/21 2055 09/26/21 2357 09/27/21 0209 09/27/21 0412 09/27/21 1617 09/28/21 0345  WBC 18.3*  --  17.3*  --   --  18.7* 19.6* 18.0*  HGB 12.4*   < > 12.4* 11.6* 10.9* 11.9* 11.5*  11.3*  HCT 36.2*   < > 35.9* 34.0* 32.0* 35.5* 35.0* 34.5*  MCV 91.2  --  90.0  --   --  90.8 93.6 93.8  PLT 121*  --  130*  --   --  142* 135* 131*   < > = values in this interval not displayed.     Basic Metabolic Panel: Recent Labs  Lab 09/26/21 1639 09/26/21 2055 09/26/21 2357 09/27/21 0209 09/27/21 0412 09/27/21 1617 09/28/21 0345  NA 135 135 140 140 133* 134* 131*  K 3.9 3.8 3.8 3.9 4.1 4.3 4.5  CL 106 107  --   --  104 103 102  CO2 22 21*  --   --  21* 22 24  GLUCOSE 161* 161*  --   --  152* 150* 147*  BUN 9 11  --   --  11 12 14   CREATININE 0.97 0.98  --   --  0.93 1.08 1.20  CALCIUM 8.4* 8.4*  --   --  8.5* 8.8* 8.6*  MG 2.7* 2.8*  --   --  2.5* 2.2  --     GFR: Estimated Creatinine Clearance: 56.7 mL/min (by C-G formula based on SCr of 1.2 mg/dL). Recent Labs  Lab 09/26/21 2055 09/27/21 0412 09/27/21 1617 09/28/21 0345  WBC 17.3* 18.7* 19.6* 18.0*     Liver Function Tests: Recent Labs  Lab 09/25/21 1204  AST 23  ALT 31  ALKPHOS 55  BILITOT 0.7  PROT 6.3*  ALBUMIN 3.4*    No results for input(s): LIPASE, AMYLASE in the last 168 hours. No results for input(s): AMMONIA in the last 168 hours.  ABG    Component Value Date/Time   PHART 7.318 (L) 09/27/2021 0209   PCO2ART 39.8 09/27/2021 0209   PO2ART 85 09/27/2021 0209   HCO3 20.5 09/27/2021 0209   TCO2 22 09/27/2021 0209   ACIDBASEDEF 5.0 (H) 09/27/2021 0209   O2SAT 96.0 09/27/2021 Artois Felicitas Sine, DO 09/28/21 8:25 AM [facility]

## 2021-09-29 LAB — CBC
HCT: 32.6 % — ABNORMAL LOW (ref 39.0–52.0)
Hemoglobin: 10.7 g/dL — ABNORMAL LOW (ref 13.0–17.0)
MCH: 30.6 pg (ref 26.0–34.0)
MCHC: 32.8 g/dL (ref 30.0–36.0)
MCV: 93.1 fL (ref 80.0–100.0)
Platelets: 134 10*3/uL — ABNORMAL LOW (ref 150–400)
RBC: 3.5 MIL/uL — ABNORMAL LOW (ref 4.22–5.81)
RDW: 14.1 % (ref 11.5–15.5)
WBC: 15 10*3/uL — ABNORMAL HIGH (ref 4.0–10.5)
nRBC: 0 % (ref 0.0–0.2)

## 2021-09-29 LAB — GLUCOSE, CAPILLARY
Glucose-Capillary: 118 mg/dL — ABNORMAL HIGH (ref 70–99)
Glucose-Capillary: 120 mg/dL — ABNORMAL HIGH (ref 70–99)
Glucose-Capillary: 127 mg/dL — ABNORMAL HIGH (ref 70–99)
Glucose-Capillary: 128 mg/dL — ABNORMAL HIGH (ref 70–99)
Glucose-Capillary: 140 mg/dL — ABNORMAL HIGH (ref 70–99)
Glucose-Capillary: 142 mg/dL — ABNORMAL HIGH (ref 70–99)
Glucose-Capillary: 162 mg/dL — ABNORMAL HIGH (ref 70–99)

## 2021-09-29 LAB — BASIC METABOLIC PANEL
Anion gap: 8 (ref 5–15)
BUN: 15 mg/dL (ref 8–23)
CO2: 25 mmol/L (ref 22–32)
Calcium: 8.3 mg/dL — ABNORMAL LOW (ref 8.9–10.3)
Chloride: 102 mmol/L (ref 98–111)
Creatinine, Ser: 0.97 mg/dL (ref 0.61–1.24)
GFR, Estimated: >60 mL/min (ref >60)
Glucose, Bld: 115 mg/dL — ABNORMAL HIGH (ref 70–99)
Potassium: 4 mmol/L (ref 3.5–5.1)
Sodium: 135 mmol/L (ref 135–145)

## 2021-09-29 MED ORDER — POTASSIUM CHLORIDE CRYS ER 20 MEQ PO TBCR
40.0000 meq | EXTENDED_RELEASE_TABLET | Freq: Every day | ORAL | Status: DC
  Administered 2021-09-29 – 2021-10-01 (×3): 40 meq via ORAL
  Filled 2021-09-29 (×3): qty 2

## 2021-09-29 MED ORDER — INSULIN ASPART 100 UNIT/ML IJ SOLN: 0.0000 [IU] | INTRAMUSCULAR | Status: DC

## 2021-09-29 MED ORDER — INSULIN ASPART 100 UNIT/ML IJ SOLN
0.0000 [IU] | Freq: Three times a day (TID) | INTRAMUSCULAR | Status: DC
  Administered 2021-09-29: 2 [IU] via SUBCUTANEOUS
  Administered 2021-09-29: 4 [IU] via SUBCUTANEOUS
  Administered 2021-09-30: 2 [IU] via SUBCUTANEOUS
  Administered 2021-09-30: 4 [IU] via SUBCUTANEOUS
  Administered 2021-10-01: 2 [IU] via SUBCUTANEOUS

## 2021-09-29 MED ORDER — FUROSEMIDE 40 MG PO TABS
40.0000 mg | ORAL_TABLET | Freq: Every day | ORAL | Status: DC
  Administered 2021-09-29 – 2021-10-01 (×3): 40 mg via ORAL
  Filled 2021-09-29 (×3): qty 1

## 2021-09-29 NOTE — Progress Notes (Signed)
0700 - Report received from PM RN.  All questions answered.  Safety checks performed.  All lines verified. Hand hygiene performed before/after each pt contact. 0800 - Assessment.  Pt assisted to/from bathroom without difficulty, distress, or incident. 74 - Pt's wife arrived to visit.  Pt only ate a few bites of breakfast and so he was given an Ensure shake. Pt liked the vanilla one. 1045 - Pt's pastor arrived to visit. 1130 - Pt's lunch arrived.  Pt ate approximately 50 %.   12 - Pt assisted to/from bathroom and then ambulated 270 ft in the hall without difficulty, distress, or incident. 1240 - Report called to Cindee Salt RN.  All questions answered.

## 2021-09-29 NOTE — Progress Notes (Signed)
Patient arrived from [facility] to 4e09, patient assisted to standing scale and then to bed. Vital signs obtained. And patient placed on monitor CCMD made aware. Patient with call bell within reach. CHG bath completed. Anel Creighton, Bettina Gavia RN

## 2021-09-29 NOTE — Progress Notes (Signed)
      [address]       [place] [address]             [phone]                 3 Days Post-Op Procedure(s) (LRB): CORONARY ARTERY BYPASS GRAFTING (CABG) X 3 , USING LEFT INTERNAL MAMMARY ARTERY & RIGHT LEG GREATER SAPHENOUS VEIN HARVESTED ENDOSCOPICALLY (N/A) TRANSESOPHAGEAL ECHOCARDIOGRAM (TEE) (N/A) ENDOVEIN HARVEST OF GREATER SAPHENOUS VEIN (Right) APPLICATION OF CELL SAVER   Events: No events  _______________________________________________________________ Vitals: BP 140/71   Pulse 82   Temp 98.5 F (36.9 C) (Oral)   Resp (!) 23   Ht 5\' 9"  (1.753 m)   Wt 102.7 kg   SpO2 92%   BMI 33.44 kg/m  Filed Weights   09/27/21 0500 09/28/21 0500 09/29/21 0600  Weight: 103.4 kg 101.6 kg 102.7 kg     - Neuro: alert NAD  - Cardiovascular: sinus  Drips: none.      - Pulm: EWOB    ABG    Component Value Date/Time   PHART 7.318 (L) 09/27/2021 0209   PCO2ART 39.8 09/27/2021 0209   PO2ART 85 09/27/2021 0209   HCO3 20.5 09/27/2021 0209   TCO2 22 09/27/2021 0209   ACIDBASEDEF 5.0 (H) 09/27/2021 0209   O2SAT 96.0 09/27/2021 0209    - Abd: ND - Extremity: trace edema  .Intake/Output      12/08 0701 12/09 0700 12/09 0701 12/10 0700   P.O.     I.V. (mL/kg) 3 (0)    IV Piggyback 100    Total Intake(mL/kg) 103 (1)    Urine (mL/kg/hr) 760 (0.3)    Stool 0    Chest Tube 0    Total Output 760    Net -657         Urine Occurrence 1 x    Stool Occurrence 2 x       _______________________________________________________________ Labs: CBC Latest Ref Rng & Units 09/29/2021 09/28/2021 09/27/2021  WBC 4.0 - 10.5 K/uL 15.0(H) 18.0(H) 19.6(H)  Hemoglobin 13.0 - 17.0 g/dL 10.7(L) 11.3(L) 11.5(L)  Hematocrit 39.0 - 52.0 % 32.6(L) 34.5(L) 35.0(L)  Platelets 150 - 400 K/uL 134(L) 131(L) 135(L)   CMP Latest Ref Rng & Units 09/29/2021 09/28/2021 09/27/2021  Glucose 70 - 99 mg/dL 115(H) 147(H) 150(H)  BUN 8 - 23 mg/dL 15 14 12   Creatinine 0.61 - 1.24  mg/dL 0.97 1.20 1.08  Sodium 135 - 145 mmol/L 135 131(L) 134(L)  Potassium 3.5 - 5.1 mmol/L 4.0 4.5 4.3  Chloride 98 - 111 mmol/L 102 102 103  CO2 22 - 32 mmol/L 25 24 22   Calcium 8.9 - 10.3 mg/dL 8.3(L) 8.6(L) 8.8(L)  Total Protein 6.5 - 8.1 g/dL - - -  Total Bilirubin 0.3 - 1.2 mg/dL - - -  Alkaline Phos 38 - 126 U/L - - -  AST 15 - 41 U/L - - -  ALT 0 - 44 U/L - - -    CXR: -  _______________________________________________________________  Assessment and Plan: POD 3 s/p CABG  Neuro: pain controlled. CV: on A/S/BB.  Plavix started yesterday Pulm: pulm hygiene Renal: creat stable.  Will diurese today GI: on diet. Heme: stable ID: [email or web address] Endo: SSI Dispo: floor   Sadye Kiernan O Andersson [place] 09/29/2021 7:48 AM

## 2021-09-29 NOTE — Progress Notes (Signed)
Mobility Specialist: Progress Note   09/29/21 1741  Mobility  Activity Ambulated in hall  Level of Assistance Contact guard assist, steadying assist  Assistive Device Front wheel walker  Distance Ambulated (ft) 160 ft  Mobility Ambulated with assistance in hallway  Mobility Response Tolerated well  Mobility performed by Mobility specialist  $Mobility charge 1 Mobility   Pre-Mobility: 88 HR, 100/77 BP Post-Mobility: 89 HR  Pt c/o feeling nauseated during ambulation, otherwise no other c/o. Pt back to bed with call bell and phone in reach. Family member present in the room.   [facility] William [name] Mobility Specialist Mobility Specialist Phone #1: [phone] Mobility Specialist Phone #2: [phone]

## 2021-09-30 LAB — GLUCOSE, CAPILLARY
Glucose-Capillary: 122 mg/dL — ABNORMAL HIGH (ref 70–99)
Glucose-Capillary: 172 mg/dL — ABNORMAL HIGH (ref 70–99)
Glucose-Capillary: 133 mg/dL — ABNORMAL HIGH (ref 70–99)
Glucose-Capillary: 172 mg/dL — ABNORMAL HIGH (ref 70–99)

## 2021-09-30 MED ORDER — MIDODRINE HCL 5 MG PO TABS
2.5000 mg | ORAL_TABLET | Freq: Three times a day (TID) | ORAL | Status: DC
  Administered 2021-09-30 – 2021-10-01 (×3): 2.5 mg via ORAL
  Filled 2021-09-30 (×4): qty 1

## 2021-09-30 MED ORDER — AMIODARONE HCL 200 MG PO TABS
200.0000 mg | ORAL_TABLET | Freq: Two times a day (BID) | ORAL | Status: DC
  Filled 2021-09-30: qty 1

## 2021-09-30 NOTE — Progress Notes (Addendum)
CARDIAC REHAB PHASE I   PRE:  Rate/Rhythm: 88 SR  BP:  Sitting: 134/77      SaO2: 94 RA  MODE:  Ambulation: 200 ft   POST:  Rate/Rhythm: 101 ST  BP:  Sitting: 135/84      SaO2: 94 RA  Pt tolerated exercise well and amb 200 ft with  front wheel walker , and contact guard assist. Pt denies CP, SOB, or dizziness throughout walk. Ed given to pt and family member. Discussed heart healthy diet, sternal precautions, IS use, wound care, restrictions, and exercise guidelines. Will refer to [place]. Pt left in the bed w/ call bell in reach. Discussed family care upon discharge w/ pt. Will continue to follow.  [phone] Sheppard Plumber, MS, ACSM-CEP 09/30/2021 9:16 AM

## 2021-09-30 NOTE — Progress Notes (Addendum)
[address]       [place] [address]             [phone]      4 Days Post-Op Procedure(s) (LRB): CORONARY ARTERY BYPASS GRAFTING (CABG) X 3 , USING LEFT INTERNAL MAMMARY ARTERY & RIGHT LEG GREATER SAPHENOUS VEIN HARVESTED ENDOSCOPICALLY (N/A) TRANSESOPHAGEAL ECHOCARDIOGRAM (TEE) (N/A) ENDOVEIN HARVEST OF GREATER SAPHENOUS VEIN (Right) APPLICATION OF CELL SAVER Subjective: Transfer from ICU to 4E yesterday.  Resting in bed, says he feels OK. Still having some nausea but has been eating and had a BM yesterday. No abdominal pain.   Objective: Vital signs in last 24 hours: Temp:  [97.8 F (36.6 C)-98.9 F (37.2 C)] 97.8 F (36.6 C) (12/10 0434) Pulse Rate:  [74-81] 77 (12/10 0434) Cardiac Rhythm: Normal sinus rhythm (12/10 0400) Resp:  [16-21] 20 (12/10 0440) BP: (107-145)/(67-80) 139/79 (12/10 0434) SpO2:  [88 %-97 %] 94 % (12/10 0434) Weight:  [99.5 kg-102.1 kg] 99.5 kg (12/10 0440)  Hemodynamic parameters for last 24 hours:    Intake/Output from previous day: 12/09 0701 - 12/10 0700 In: 280 [P.O.:280] Out: 400 [Urine:400] Intake/Output this shift: No intake/output data recorded.  General appearance: alert, cooperative, and no distress Neurologic: intact Heart: RRR, no further arrhythmias Lungs: breath sounds are CTA.  Abdomen: soft, NT, hyperactive bowel sounds Extremities: warm, well perfused, no edema. Wound: the sternotomy incision is intact and dry.   Lab Results: Recent Labs    09/28/21 0345 09/29/21 0246  WBC 18.0* 15.0*  HGB 11.3* 10.7*  HCT 34.5* 32.6*  PLT 131* 134*   BMET:  Recent Labs    09/28/21 0345 09/29/21 0246  NA 131* 135  K 4.5 4.0  CL 102 102  CO2 24 25  GLUCOSE 147* 115*  BUN 14 15  CREATININE 1.20 0.97  CALCIUM 8.6* 8.3*    PT/INR: No results for input(s): LABPROT, INR in the last 72 hours. ABG    Component Value Date/Time   PHART 7.318 (L) 09/27/2021 0209   HCO3 20.5 09/27/2021 0209   TCO2  22 09/27/2021 0209   ACIDBASEDEF 5.0 (H) 09/27/2021 0209   O2SAT 96.0 09/27/2021 0209   CBG (last 3)  Recent Labs    09/29/21 1719 09/29/21 2122 09/30/21 0552  GLUCAP 162* 118* 122*    Assessment/Plan: S/P Procedure(s) (LRB): CORONARY ARTERY BYPASS GRAFTING (CABG) X 3 , USING LEFT INTERNAL MAMMARY ARTERY & RIGHT LEG GREATER SAPHENOUS VEIN HARVESTED ENDOSCOPICALLY (N/A) TRANSESOPHAGEAL ECHOCARDIOGRAM (TEE) (N/A) ENDOVEIN HARVEST OF GREATER SAPHENOUS VEIN (Right) APPLICATION OF CELL SAVER  -CV-POD4 CABG x 3 for CAD presenting sith NSTEMI and normal EF. Started on amiodarone in the OR for an episode of VF post cardiopulmonary bypass. No further arrhythmias.  On ASA, Plavix, Crestor, and low-dose metoprolol. Progressing with mobility.  -GI- mild but persistent nausea. Able to eat and having normal bowel function. Suspect related to the amiodarone. Will reduce the dose and monitor the rhythm today.   -Renal- normal function. Wt about 2.5kg+, continue diuresis with oral Lasix.  -Pulm- no issues, maintaining sats on RA. Continue to encourage pulm hygiene.   -HEME- Mild ABL anemia, Hct stable. On enoxaparin for DVT PPX.  -Disposition- Plan for discharge in AM if rhythm stable.     LOS: 9 days    William [name], [place] [phone] 09/30/2021  Patient seen and examined, agree with above Nausea remains an issue- dc amiodarone and observe rhythm  Remo Lipps C. Roxan Hockey, MD [facility] [phone]

## 2021-09-30 NOTE — Progress Notes (Signed)
Mobility Specialist Criteria Algorithm Info.   09/30/21 1107  Mobility  Activity Ambulated in hall  Range of Motion/Exercises Active;All extremities  Level of Assistance Standby assist, set-up cues, supervision of patient - no hands on  Assistive Device Front wheel walker  Distance Ambulated (ft) 380 ft  Mobility Ambulated with assistance in hallway  Mobility Response Tolerated well  Mobility performed by Mobility specialist  Bed Position Semi-fowlers   Patient received in bed eager to participate in mobility. Got to EOB and stood independently. Ambulated in hallway supervision level with steady gait. Returned to room without incident or complaint and was left with all needs met.  09/30/2021 1:07 PM

## 2021-09-30 NOTE — Progress Notes (Signed)
Progress Note  Patient Name: William [name] [facility] Date of Encounter: 09/30/2021  Primary Cardiologist: Sinclair Grooms, MD   Subjective   Post CABG doing well has ambulated rhythm stable   Inpatient Medications    Scheduled Meds:  acetaminophen  1,000 mg Oral Q6H   amiodarone  200 mg Oral BID   aspirin EC  81 mg Oral Daily   bisacodyl  10 mg Oral Daily   chlorhexidine gluconate (MEDLINE KIT)  15 mL Mouth Rinse BID   Chlorhexidine Gluconate Cloth  6 each Topical Daily   clopidogrel  75 mg Oral Daily   docusate sodium  200 mg Oral Daily   enoxaparin (LOVENOX) injection  40 mg Subcutaneous QHS   ezetimibe  10 mg Oral Daily   furosemide  40 mg Oral Daily   insulin aspart  0-24 Units Subcutaneous TID AC & HS   insulin detemir  10 Units Subcutaneous BID   mouth rinse  15 mL Mouth Rinse BID   metoprolol tartrate  12.5 mg Oral BID   midodrine  2.5 mg Oral TID WC   pantoprazole  40 mg Oral Daily   potassium chloride  40 mEq Oral Daily   rosuvastatin  20 mg Oral Daily   sodium chloride flush  10-40 mL Intracatheter Q12H   Continuous Infusions:  sodium chloride     sodium chloride     sodium chloride     lactated ringers     lactated ringers     lactated ringers Stopped (09/27/21 1654)   promethazine (PHENERGAN) injection (IM or IVPB) Stopped (09/27/21 1400)   PRN Meds: sodium chloride, dextrose, lactated ringers, metoprolol tartrate, ondansetron (ZOFRAN) IV, oxyCODONE, promethazine (PHENERGAN) injection (IM or IVPB), traMADol   Vital Signs    Vitals:   09/30/21 0032 09/30/21 0434 09/30/21 0440 09/30/21 0850  BP: 133/73 139/79  135/84  Pulse: 74 77  83  Resp: (!) _0 Temp: 98.9 F (37.2 C) 97.8 F (36.6 C)  98.7 F (37.1 C)  TempSrc: Oral Oral  Oral  SpO2: 95% 94%  96%  Weight:   99.5 kg   Height:        Intake/Output Summary (Last 24 hours) at 09/30/2021 1019 Last data filed at 09/30/2021 8850 Gross per 24 hour  Intake 220 ml  Output 400 ml  Net  -180 ml   Filed Weights   09/29/21 0600 09/29/21 1331 09/30/21 0440  Weight: 102.7 kg 102.1 kg 99.5 kg    Telemetry    Sinus rhythm- Personally Reviewed  ECG    None today- Personally Reviewed  Physical Exam   GEN: No acute distress.   Neck: No JVD, right IJ in place. Cardiac: RRR, no murmurs, rubs, or gallops.  Respiratory: Clear to auscultation bilaterally. Chest: Chest tube out post sternotomy RIJ line removed  GI: Soft, nontender, non-distended  MS: No edema; No deformity. Neuro:  Nonfocal  Psych: Normal affect    Labs    Chemistry Recent Labs  Lab 09/25/21 1204 09/26/21 0441 09/27/21 1617 09/28/21 0345 09/29/21 0246  NA 136   < > 134* 131* 135  K 3.7   < > 4.3 4.5 4.0  CL 106   < > 103 102 102  CO2 25   < > _1 GLUCOSE 168*   < > 150* 147* 115*  BUN 14   < > _2 CREATININE 1.12   < > 1.08 1.20 0.97  CALCIUM  9.1   < > 8.8* 8.6* 8.3*  PROT 6.3*  --   --   --   --   ALBUMIN 3.4*  --   --   --   --   AST 23  --   --   --   --   ALT 31  --   --   --   --   ALKPHOS 55  --   --   --   --   BILITOT 0.7  --   --   --   --   GFRNONAA >60   < > >60 >60 >60  ANIONGAP 5   < > 9 5 8   < > = values in this interval not displayed.     Hematology Recent Labs  Lab 09/27/21 1617 09/28/21 0345 09/29/21 0246  WBC 19.6* 18.0* 15.0*  RBC 3.74* 3.68* 3.50*  HGB 11.5* 11.3* 10.7*  HCT 35.0* 34.5* 32.6*  MCV 93.6 93.8 93.1  MCH 30.7 30.7 30.6  MCHC 32.9 32.8 32.8  RDW 13.8 14.3 14.1  PLT 135* 131* 134*    Cardiac EnzymesNo results for input(s): TROPONINI in the last 168 hours. No results for input(s): TROPIPOC in the last 168 hours.   BNPNo results for input(s): BNP, PROBNP in the last 168 hours.   DDimer No results for input(s): DDIMER in the last 168 hours.   Radiology    No results found.  Cardiac Studies   TTE 09/22/21  1. Left ventricular ejection fraction, by estimation, is 60 to 65%. The  left ventricle has normal function. The left  ventricle has no regional  wall motion abnormalities. There is mild left ventricular hypertrophy.  Left ventricular diastolic parameters  are consistent with Grade I diastolic dysfunction (impaired relaxation).   2. Right ventricular systolic function is normal. The right ventricular  size is normal. There is normal pulmonary artery systolic pressure. The  estimated right ventricular systolic pressure is 19.0 mmHg.   3. The mitral valve is normal in structure. Trivial mitral valve  regurgitation.   4. The aortic valve was not well visualized. Aortic valve regurgitation  is not visualized. Aortic valve sclerosis/calcification is present,  without any evidence of aortic stenosis.   5. The inferior vena cava is normal in size with greater than 50%  respiratory variability, suggesting right atrial pressure of 3 mmHg.     LHC 09/22/2021   2nd Mrg lesion is 75% stenosed.   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   Mid LM lesion is 90% stenosed.   The left ventricular systolic function is normal.   LV end diastolic pressure is mildly elevated.   The left ventricular ejection fraction is 50-55% by visual estimate.  Patient Profile     80 y.o. male CAD (nonobstructive CAD by cath in 2017), HTN and HLD who presented to [facility] ED on 09/20/2021 for evaluation of chest pain. Found to have an NSTEMI, here with LM dx pending evaluation for CABG planned for today.  Assessment & Plan    Multivessel CAD status post CABG Diabetes mellitus Hypertension  He is status post day 4 for his CABG-LIMA to LAD, right SVG to the OM 2, SVG to PDA.   Continue current medication.  Acute changes per CT surgery.  Appears to have good urine output.  Diabetes mellitus will be managed on sliding scale.  Blood pressure is acceptable.    For questions or updates, please contact [facility] Please consult [email or web address] for contact   info under Cardiology/STEMI.      Signed, Peter Nishan, MD  09/30/2021, 10:19 AM    

## 2021-10-01 ENCOUNTER — Inpatient Hospital Stay (HOSPITAL_COMMUNITY): Payer: Medicare Other

## 2021-10-01 LAB — GLUCOSE, CAPILLARY
Glucose-Capillary: 117 mg/dL — ABNORMAL HIGH (ref 70–99)
Glucose-Capillary: 145 mg/dL — ABNORMAL HIGH (ref 70–99)

## 2021-10-01 LAB — BASIC METABOLIC PANEL
Anion gap: 8 (ref 5–15)
BUN: 13 mg/dL (ref 8–23)
CO2: 29 mmol/L (ref 22–32)
Calcium: 8.6 mg/dL — ABNORMAL LOW (ref 8.9–10.3)
Chloride: 100 mmol/L (ref 98–111)
Creatinine, Ser: 1.04 mg/dL (ref 0.61–1.24)
GFR, Estimated: >60 mL/min (ref >60)
Glucose, Bld: 99 mg/dL (ref 70–99)
Potassium: 3.5 mmol/L (ref 3.5–5.1)
Sodium: 137 mmol/L (ref 135–145)

## 2021-10-01 MED ORDER — ROSUVASTATIN CALCIUM 20 MG PO TABS: 20.0000 mg | ORAL_TABLET | Freq: Every day | ORAL | 5 refills | Status: DC

## 2021-10-01 MED ORDER — TRAMADOL HCL 50 MG PO TABS: 50.0000 mg | ORAL_TABLET | Freq: Four times a day (QID) | ORAL | 0 refills | Status: AC | PRN

## 2021-10-01 MED ORDER — METOPROLOL TARTRATE 25 MG PO TABS: 25.0000 mg | ORAL_TABLET | Freq: Two times a day (BID) | ORAL | 2 refills | Status: DC

## 2021-10-01 MED ORDER — CLOPIDOGREL BISULFATE 75 MG PO TABS: 75.0000 mg | ORAL_TABLET | Freq: Every day | ORAL | 11 refills | Status: DC

## 2021-10-01 NOTE — Progress Notes (Addendum)
      [address]       [place] [address]             [phone]      5 Days Post-Op Procedure(s) (LRB): CORONARY ARTERY BYPASS GRAFTING (CABG) X 3 , USING LEFT INTERNAL MAMMARY ARTERY & RIGHT LEG GREATER SAPHENOUS VEIN HARVESTED ENDOSCOPICALLY (N/A) TRANSESOPHAGEAL ECHOCARDIOGRAM (TEE) (N/A) ENDOVEIN HARVEST OF GREATER SAPHENOUS VEIN (Right) APPLICATION OF CELL SAVER Subjective: Resting in bed, says he feels well this morning.  No complaints or concerns.  He walked in the hall twice yesterday and is independent with mobility and transfers. Tolerating p.o.'s, appropriate bowel function.  Objective: Vital signs in last 24 hours: Temp:  [97.8 F (36.6 C)-99.8 F (37.7 C)] 99 F (37.2 C) (12/11 0831) Pulse Rate:  [73-94] 86 (12/11 0831) Cardiac Rhythm: Normal sinus rhythm (12/10 2036) Resp:  [14-20] 20 (12/11 0831) BP: (116-148)/(63-88) 148/85 (12/11 0831) SpO2:  [93 %-97 %] 96 % (12/11 0831)  Hemodynamic parameters for last 24 hours:    Intake/Output from previous day: 12/10 0701 - 12/11 0700 In: -  Out: 700 [Urine:700] Intake/Output this shift: Total I/O In: -  Out: 375 [Urine:375]  General appearance: alert, cooperative, and no distress Neurologic: intact Heart: RRR, no further arrhythmias Lungs: breath sounds are CTA.  Abdomen: soft, NT, hyperactive bowel sounds Extremities: warm, well perfused, no edema. Wound: the sternotomy incision is intact and dry.   Lab Results: Recent Labs    09/29/21 0246  WBC 15.0*  HGB 10.7*  HCT 32.6*  PLT 134*    BMET:  Recent Labs    09/29/21 0246 10/01/21 0131  NA 135 137  K 4.0 3.5  CL 102 100  CO2 25 29  GLUCOSE 115* 99  BUN 15 13  CREATININE 0.97 1.04  CALCIUM 8.3* 8.6*     PT/INR: No results for input(s): LABPROT, INR in the last 72 hours. ABG    Component Value Date/Time   PHART 7.318 (L) 09/27/2021 0209   HCO3 20.5 09/27/2021 0209   TCO2 22 09/27/2021 0209   ACIDBASEDEF 5.0 (H)  09/27/2021 0209   O2SAT 96.0 09/27/2021 0209   CBG (last 3)  Recent Labs    09/30/21 1747 09/30/21 2127 10/01/21 0621  GLUCAP 133* 172* 117*     Assessment/Plan: S/P Procedure(s) (LRB): CORONARY ARTERY BYPASS GRAFTING (CABG) X 3 , USING LEFT INTERNAL MAMMARY ARTERY & RIGHT LEG GREATER SAPHENOUS VEIN HARVESTED ENDOSCOPICALLY (N/A) TRANSESOPHAGEAL ECHOCARDIOGRAM (TEE) (N/A) ENDOVEIN HARVEST OF GREATER SAPHENOUS VEIN (Right) APPLICATION OF CELL SAVER  -CV-POD5 CABG x 3 for CAD presenting sith NSTEMI and normal EF. Started on amiodarone in the OR for an episode of VF post cardiopulmonary bypass.  He is maintaining sinus rhythm with few PACs.  Amiodarone discontinued.  Continue ASA, Plavix, Crestor, and low-dose metoprolol.   -GI-improved, tolerating p.o.'s with no difficulty.  -Renal- normal function. Wt still about 2.5kg+, continue diuresis with oral Lasix for a few days after discharge next yesterday for the last cursor still very  -Pulm- no issues, maintaining sats on RA. Continue to encourage pulm hygiene.   -HEME- Mild ABL anemia, Hct stable. On enoxaparin for DVT PPX.  -Disposition- Plan for discharge to home today.  Instructions given.   LOS: 10 days    Antony Odea, Hershal Coria [phone] 10/01/2021  Patient seen and examined Nausea resolved Home today  Revonda Standard. Roxan Hockey, MD [facility] [phone]

## 2021-10-01 NOTE — Progress Notes (Signed)
Discharge instructions provided to patient. All medications, follow up appointments, and discharge instructions provided. IV out. Monitor off CCMD notified. Discharging to home with wife.  Era Bumpers, RN

## 2021-10-01 NOTE — Discharge Summary (Signed)
Physician Discharge Summary  Patient ID: William [name] [facility] MRN: [id number] DOB/AGE: [birth year] 80 y.o.  Admit date: 09/20/2021 Discharge date: 10/01/2021  Admission Diagnoses:  Acute non-ST elevation myocardial infarction Multivessel coronary artery disease Hypertension Type 2 diabetes mellitus Dyslipidemia  Discharge Diagnoses:   Acute non-ST elevation myocardial infarction Multivessel coronary artery disease Hypertension Type 2 diabetes mellitus S/P CABG x 3 Expected acute blood loss anemia Expected postoperative volume excess Dyslipidemia  Discharged Condition: stable  History of Present Illness:      William [name] is a very pleasant 80 year old gentleman with known coronary artery disease dating back to 2017.  He has been managed medically with aspirin, metoprolol, and pravastatin.  He also has a history of hypertension, dyslipidemia, type 2 diabetes mellitus and benign prostatic hyperplasia.  He was brought to [facility] yesterday by way of EMS after experiencing chest tightness that radiated to both arms and into his back.  His wife also reported that he had an episode of confusion, memory loss, and posterior headache this past weekend.  The symptoms resolved after lasting for about 45 minutes.     Evaluation in the emergency room included an EKG that showed possible inferior infarct.  High-sensitivity troponin was initially 18 and trended up to 125 by yesterday afternoon and was 812 by midnight last night.  Chest x-ray showed no acute abnormalities.  His chest pain subsided after receiving sublingual nitroglycerin.  He was admitted to [facility] and started on a heparin infusion.  He remained stable and was transferred to [facility] today for further evaluation and possible intervention.  An MRI of his brain obtained earlier today showed no acute abnormality.  He went on to have left heart catheterization this afternoon.  This demonstrates left main and  multivessel coronary artery disease.  Specifically, there is a 90% mid left main coronary artery stenosis.  There is a 75% stenosis in the mid segment of OM 2.  There is also a 90% stenosis in the midportion of the RPDA.  Left ventricular ejection fraction was 50 to 55% by visual estimate on left ventriculogram.   William [name] has remained stable since admission and denies any further pain.  He does say he has some occasional sensation of tightness in his chest.  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  He considers himself pretty healthy overall and he said he and his wife live a fairly active lifestyle.  He has had 2 back surgeries and has had rotator cuff repair on both shoulders.  He is also status post transurethral resection of the prostate and cholecystectomy.  He is edentulous and has full upper and lower plate dentures.   Course in Hospital: William [name] remained stable following left heart catheterization.  He decided to proceed with surgery.  He was taken to the operating room on 09/26/2021 where CABG x3 was carried out.  The left internal mammary artery was grafted to the left anterior descending coronary artery and saphenous vein grafts were placed to OM 2 and to the PDA.  Following the procedure, he separated from cardiopulmonary bypass without difficulty and was transferred to the surgical ICU in stable condition.  The critical care team was consulted to assist with early postop management.  William [name] was weaned from mechanical ventilation and extubated at about midnight on the day of surgery.  Glucose was monitored carefully and sliding scale insulin provided as required.  His renal function has  been stable.  He has been started on Plavix following epicardial pacer wire removal.  Chest tubes were removed on postop day #2.  Diet is advanced in a routine manner.  He had expected acute blood loss anemia which is stable and did not require transfusion..  On  postop day #2 he was transferred to the floor for ongoing routine rehabilitation.  Diet and activity were advanced and well-tolerated.  He had some nausea that persisted and was felt to be related to the amiodarone.  Since his cardiac rhythm remained stable with no further ventricular arrhythmias, the amiodarone was discontinued.  We will continue to observe his rhythm for another 24 hours and he remained in a stable sinus rhythm with few PACs.  His blood pressure improved so that the midodrine was discontinued.  The metoprolol will be uptitrated to 25 mg twice daily at discharge. At the time of discharge, he was ambulating and transferring in and out of the bed independently without difficulty.  Consults: None  Significant Diagnostic Studies:   CLINICAL DATA:  Pleural effusion.   EXAM: CHEST - 2 VIEW   COMPARISON:  September 28, 2021.   FINDINGS: Stable cardiomediastinal silhouette. Status post coronary bypass graft. Right lung is clear. Mild left basilar subsegmental atelectasis is noted with small left pleural effusion. Bony thorax is unremarkable.   IMPRESSION: Mild left basilar subsegmental atelectasis is noted with small left pleural effusion.     Electronically Signed   By: Marijo Conception M.D.   On: 10/01/2021 06:02    Treatments: Surgery  09/26/2021 [name] [facility] Pre-Op Dx: Left main/three-vessel coronary artery disease                         NSTEMI                         Hypertension                         Diabetes mellitus                          Post-op Dx: Same Procedure: CABG X 3.  LIMA to LAD, reverse saphenous vein graft to OM 2, reverse saphenous vein graft to PDA Endoscopic greater saphenous vein harvest on the right     Surgeon and Role:      * Lightfoot, Lucile Crater, MD - Primary    *M. Jahon Bart, PA-C- assisting An experienced assistant was required given the complexity of this surgery and the standard of surgical care. The assistant  was needed for exposure, dissection, suctioning, retraction of delicate tissues and sutures, instrument exchange and for overall help during this procedure.     Anesthesia  general EBL: 500 ml Blood Administration: None Xclamp Time: 50 min Pump Time: 86 min   Drains: 19 F blake drain: L, mediastinal  Wires: Ventricular Counts: correct     Indications: This is an 80 year old gentleman this admitted following a left heart cath which showed severe left main disease.  He also has some disease off of his RCA.  He does have a history of diabetes and hypertension.  I personally reviewed his left heart cath and echocardiogram.  He does have good distal targets in all walls.  He also has good biventricular function and no significant valvular disease.  The risks and benefits of surgical revascularization have  been discussed and he is agreeable to proceed.   Findings: Good vein, good LIMA.  The LAD was partially intramyocardial.  The PDA was small but there were good flows on the vein graft.  The obtuse marginal was a good quality vessel.  Patient did require defibrillation twice while coming off pump thus an amiodarone drip was started.  Discharge Exam: Blood pressure (!) 148/85, pulse 86, temperature 99 F (37.2 C), temperature source Oral, resp. rate 20, height 5\' 9"  (1.753 m), weight 99.5 kg, SpO2 96 %.  General appearance: alert, cooperative, and no distress Neurologic: intact Heart: RRR, no further arrhythmias Lungs: breath sounds are CTA.  Abdomen: soft, NT, hyperactive bowel sounds Extremities: warm, well perfused, no edema. Wound: the sternotomy incision is intact and dry.   Disposition:   Discharged to home with his family in stable condition.  Discharge Instructions     Amb Referral to Cardiac Rehabilitation   Complete by: As directed    Diagnosis:  CABG NSTEMI     CABG X ___: 3   After initial evaluation and assessments completed: Virtual Based Care may be provided alone or  in conjunction with Phase 2 Cardiac Rehab based on patient barriers.: Yes      Allergies as of 10/01/2021       Reactions   Codeine Nausea And Vomiting, Other (See Comments)   "deathly sick"   Lipitor [atorvastatin] Other (See Comments)   Joint pain        Medication List     STOP taking these medications    pravastatin 20 MG tablet Commonly known as: PRAVACHOL       TAKE these medications    aspirin EC 81 MG tablet Take 81 mg by mouth daily.   clopidogrel 75 MG tablet Commonly known as: PLAVIX Take 1 tablet (75 mg total) by mouth daily. Start taking on: October 02, 2021   ezetimibe 10 MG tablet Commonly known as: ZETIA Take 10 mg by mouth daily.   glipiZIDE 5 MG tablet Commonly known as: GLUCOTROL Take 5 mg by mouth every morning.   metoprolol tartrate 25 MG tablet Commonly known as: LOPRESSOR Take 1 tablet (25 mg total) by mouth 2 (two) times daily. What changed:  medication strength how much to take   rosuvastatin 20 MG tablet Commonly known as: CRESTOR Take 1 tablet (20 mg total) by mouth daily. Start taking on: October 02, 2021   traMADol 50 MG tablet Commonly known as: ULTRAM Take 1 tablet (50 mg total) by mouth every 6 (six) hours as needed for up to 5 days for moderate pain.        Follow-up Information     Lajuana Matte, MD. Call on 10/06/2021.   Specialty: Cardiothoracic Surgery Why: Your appointment is at 3:50 PM. Please note, this is a virtual visit by phone call.  Dr. Kipp Brood will call you.  Do not go to the office. Contact information: [address] [place] [address] [phone]         Loel Dubonnet, NP. Go on 10/24/2021.   Specialty: Cardiology Why: Your appointment is at 8:25 AM Contact information: [place] [address] [phone]                The patient has been discharged on:   1.Beta Blocker:  Yes [ x  ]  No   [   ]                               If No, reason:  2.Ace Inhibitor/ARB: Yes [   ]                                     No  [  x  ]                                     If No, reason: BP soft, required midodrine for support  3.Statin:   Yes [  x ]                  No  [   ]                  If No, reason:  4.Ecasa:  Yes  [  x ]                  No   [   ]                  If No, reason:  4. P2Y12 Inhibitor:  [   yes  ]  Signed: Antony Odea, PA-C 10/01/2021, 11:06 AM

## 2021-10-01 NOTE — Progress Notes (Signed)
Progress Note  Patient Name: William [name] Date of Encounter: 10/01/2021  Primary Cardiologist: Sinclair Grooms, MD   Subjective   Post CABG for d/c today Good bowel habits, no arrhythmias And ambulating with good sats   Inpatient Medications    Scheduled Meds:  acetaminophen  1,000 mg Oral Q6H   aspirin EC  81 mg Oral Daily   bisacodyl  10 mg Oral Daily   chlorhexidine gluconate (MEDLINE KIT)  15 mL Mouth Rinse BID   clopidogrel  75 mg Oral Daily   docusate sodium  200 mg Oral Daily   enoxaparin (LOVENOX) injection  40 mg Subcutaneous QHS   ezetimibe  10 mg Oral Daily   furosemide  40 mg Oral Daily   insulin aspart  0-24 Units Subcutaneous TID AC & HS   insulin detemir  10 Units Subcutaneous BID   mouth rinse  15 mL Mouth Rinse BID   metoprolol tartrate  12.5 mg Oral BID   midodrine  2.5 mg Oral TID WC   pantoprazole  40 mg Oral Daily   potassium chloride  40 mEq Oral Daily   rosuvastatin  20 mg Oral Daily   sodium chloride flush  10-40 mL Intracatheter Q12H   Continuous Infusions:  sodium chloride     sodium chloride     sodium chloride     lactated ringers     lactated ringers     lactated ringers Stopped (09/27/21 1654)   promethazine (PHENERGAN) injection (IM or IVPB) Stopped (09/27/21 1400)   PRN Meds: sodium chloride, dextrose, lactated ringers, metoprolol tartrate, ondansetron (ZOFRAN) IV, oxyCODONE, promethazine (PHENERGAN) injection (IM or IVPB), traMADol   Vital Signs    Vitals:   09/30/21 1907 09/30/21 2351 10/01/21 0312 10/01/21 0831  BP: 137/63 116/75 122/73 (!) 148/85  Pulse: 91 73 79 86  Resp: _0 Temp: 98.5 F (36.9 C) 97.8 F (36.6 C) 98.2 F (36.8 C) 99 F (37.2 C)  TempSrc: Oral Oral Oral Oral  SpO2: 97% 97% 95% 96%  Weight:      Height:        Intake/Output Summary (Last 24 hours) at 10/01/2021 1024 Last data filed at 10/01/2021 0831 Gross per 24 hour  Intake --  Output 1075 ml  Net -1075 ml   Filed Weights    09/29/21 0600 09/29/21 1331 09/30/21 0440  Weight: 102.7 kg 102.1 kg 99.5 kg    Telemetry    Sinus rhythm- Personally Reviewed  ECG    None today- Personally Reviewed  Physical Exam   GEN: No acute distress.   Neck: No JVD, right IJ in place. Cardiac: RRR, no murmurs, rubs, or gallops.  Respiratory: Clear to auscultation bilaterally. Chest: Chest tube out post sternotomy RIJ line removed  GI: Soft, nontender, non-distended  MS: No edema; No deformity. Neuro:  Nonfocal  Psych: Normal affect    Labs    Chemistry Recent Labs  Lab 09/25/21 1204 09/26/21 0441 09/28/21 0345 09/29/21 0246 10/01/21 0131  NA 136   < > 131* 135 137  K 3.7   < > 4.5 4.0 3.5  CL 106   < > 102 102 100  CO2 25   < > _1 GLUCOSE 168*   < > 147* 115* 99  BUN 14   < > _2 CREATININE 1.12   < > 1.20 0.97 1.04  CALCIUM 9.1   < > 8.6* 8.3* 8.6*  PROT  6.3*  --   --   --   --   ALBUMIN 3.4*  --   --   --   --   AST 23  --   --   --   --   ALT 31  --   --   --   --   ALKPHOS 55  --   --   --   --   BILITOT 0.7  --   --   --   --   GFRNONAA >60   < > >60 >60 >60  ANIONGAP 5   < > _0 < > = values in this interval not displayed.     Hematology Recent Labs  Lab 09/27/21 1617 09/28/21 0345 09/29/21 0246  WBC 19.6* 18.0* 15.0*  RBC 3.74* 3.68* 3.50*  HGB 11.5* 11.3* 10.7*  HCT 35.0* 34.5* 32.6*  MCV 93.6 93.8 93.1  MCH 30.7 30.7 30.6  MCHC 32.9 32.8 32.8  RDW 13.8 14.3 14.1  PLT 135* 131* 134*    Cardiac EnzymesNo results for input(s): TROPONINI in the last 168 hours. No results for input(s): TROPIPOC in the last 168 hours.   BNPNo results for input(s): BNP, PROBNP in the last 168 hours.   DDimer No results for input(s): DDIMER in the last 168 hours.   Radiology    DG Chest 2 View  Result Date: 10/01/2021 CLINICAL DATA:  Pleural effusion. EXAM: CHEST - 2 VIEW COMPARISON:  September 28, 2021. FINDINGS: Stable cardiomediastinal silhouette. Status post coronary  bypass graft. Right lung is clear. Mild left basilar subsegmental atelectasis is noted with small left pleural effusion. Bony thorax is unremarkable. IMPRESSION: Mild left basilar subsegmental atelectasis is noted with small left pleural effusion. Electronically Signed   By: Marijo Conception M.D.   On: 10/01/2021 06:02    Cardiac Studies   TTE 09/22/21  1. Left ventricular ejection fraction, by estimation, is 60 to 65%. The  left ventricle has normal function. The left ventricle has no regional  wall motion abnormalities. There is mild left ventricular hypertrophy.  Left ventricular diastolic parameters  are consistent with Grade I diastolic dysfunction (impaired relaxation).   2. Right ventricular systolic function is normal. The right ventricular  size is normal. There is normal pulmonary artery systolic pressure. The  estimated right ventricular systolic pressure is 60.1 mmHg.   3. The mitral valve is normal in structure. Trivial mitral valve  regurgitation.   4. The aortic valve was not well visualized. Aortic valve regurgitation  is not visualized. Aortic valve sclerosis/calcification is present,  without any evidence of aortic stenosis.   5. The inferior vena cava is normal in size with greater than 50%  respiratory variability, suggesting right atrial pressure of 3 mmHg.     LHC 09/22/2021   2nd Mrg lesion is 75% stenosed.   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   Mid LM lesion is 90% stenosed.   The left ventricular systolic function is normal.   LV end diastolic pressure is mildly elevated.   The left ventricular ejection fraction is 50-55% by visual estimate.  Patient Profile     80 y.o. male CAD (nonobstructive CAD by cath in 2017), HTN and HLD who presented to [facility] ED on 09/20/2021 for evaluation of chest pain. Found to have an NSTEMI, here with LM dx pending evaluation for CABG planned for today.  Assessment & Plan    Multivessel CAD status post CABG Diabetes  mellitus  Hypertension  He is status post day 5 for his CABG-LIMA to LAD, right SVG to the OM 2, SVG to PDA.   Continue current medication.   Will arrange outpatient f/u with Dr Tamala Julian   For questions or updates, please contact [place] Please consult [email or web address] for contact info under Cardiology/STEMI.      Signed, Jenkins Rouge, MD  10/01/2021, 10:24 AM

## 2021-10-01 NOTE — Discharge Instructions (Signed)

## 2021-10-04 ENCOUNTER — Encounter (HOSPITAL_COMMUNITY): Payer: Self-pay | Admitting: Thoracic Surgery (Cardiothoracic Vascular Surgery)

## 2021-10-04 NOTE — OR Nursing (Signed)
10/04/2021 - Two Surgifoam containers charted under medications, addendum done to change Surgifoam containers used from 3 to 2 to agree with number of medications used by surgeon on 09/26/2021.

## 2021-10-06 ENCOUNTER — Ambulatory Visit (INDEPENDENT_AMBULATORY_CARE_PROVIDER_SITE_OTHER): Payer: Self-pay | Admitting: Thoracic Surgery (Cardiothoracic Vascular Surgery)

## 2021-10-06 ENCOUNTER — Other Ambulatory Visit: Payer: Self-pay

## 2021-10-06 DIAGNOSIS — Z951 Presence of aortocoronary bypass graft: Secondary | ICD-10-CM

## 2021-10-06 NOTE — Progress Notes (Signed)
°   °  [address]sonSuite [address]       [place], [address]             [phone]       Patient: Home Provider: Office Consent for Telemedicine visit obtained.  Todays visit was completed via a real-time telehealth (see specific modality noted below). The patient/authorized person provided oral consent at the time of the visit to engage in a telemedicine encounter with the present provider at [facility]. The patient/authorized person was informed of the potential benefits, limitations, and risks of telemedicine. The patient/authorized person expressed understanding that the laws that protect confidentiality also apply to telemedicine. The patient/authorized person acknowledged understanding that telemedicine does not provide emergency services and that he or she would need to call 911 or proceed to the nearest hospital for help if such a need arose.   Total time spent in the clinical discussion 10 minutes.  Telehealth Modality: Phone visit (audio only)  I had a telephone visit with  William [name] [facility] who is s/p CABG.  Overall doing well.   Pain is minimal.  Ambulating well. Vitals have been stable.  William [name] will see [place] back in 1 month with a chest x-ray for cardiac rehab clearance.  [name] [name]

## 2021-10-19 NOTE — Progress Notes (Addendum)
Cardiology Office Note:    Date:  10/24/2021   ID:  Caylon Saine [facility], DOB July 03, 1941, MRN [id number]  PCP:  Lawerance Cruel, MD   [place] Providers Cardiologist:  Sinclair Grooms, MD     Referring MD: Lawerance Cruel, MD   Chief Complaint: f/u CAD s/p CABG x 3 09/26/21  History of Present Illness:    Bobbye Reinitz [facility] is avery pleasant 80 y.o. male with a hx of CAD s/p CABG x 3 on 09/26/21 with LIMA to LAD, reverse saphenous vein graft to OM 2, reverse saphenous vein graft to PDA, hypertension, T2DM, obesity, and hyperlipidemia. Previous cardiac cath in 2017 revealed non-obstructive CAD with RPDA lesion 50% stenosed, normal LVEF.  He presented to the the [facility] ED on 09/20/21 with chest discomfort upon waking up that morning and "just did not feel right." He described chest tightness while getting dressed that radiated into both arms and subscapular region. Symptoms were intermittent but ultimately EMS was called and he experienced improvement in symptoms with nitroglycerin. Hs Troponins were elevated with peak at 812. His EKG showed SR with possible old inferior infarct pattern.  He was admitted to [facility] for NSTEMI.  A few days prior to admission, his wife reported an episode of headache with confusion, therefore it was felt best to obtain a brain MRI prior to catheterization in the event that coronary intervention and DAPT was needed. Brain MRI was unremarkable. He was transferred to [facility] on 09/21/21 and cardiac cath was completed. He was referred for surgical intervention due to left main/three-vessel disease with 75% stenosis 2nd marginal, 90% stenosis RPDA, and mid left main with 90% stenosis. He underwent CABG on 12/6 and had VF requiring DCCV at the end of the case. He was started on amiodarone infusion which was eventually weaned with no further episodes of VT. He was discharged home on 10/01/21. He was seen by Dr. Kipp Brood, [facility] surgeon, on 10/06/21 with plan for return visit in mid  January 2023 for repeat chest x-ray for clearance for cardiac rehab.   Today, he is here with his wife. He reports he has been feeling well and has been gradually returning to normal activity. States he is very active at home doing house and yard work as well as walking around the neighborhood. He denies chest pain, shortness of breath, lower extremity edema, fatigue, palpitations, melena, hematuria, hemoptysis, diaphoresis, weakness, presyncope, syncope, orthopnea, and PND.  He denies pain in the sternum.  He is aware to await clearance from Dr. Kipp Brood for heavy lifting.  Notes mild lightheadedness when getting up in the morning. He is being careful not to get up too quickly. Does not note this at other times during the day.  Admits to not drinking enough water, prefers to drink diet soda.  Reports home blood pressure readings have been consistently less than 130/80 mmHg. He denies problems with medications or other specific concerns today.    Past Medical History:  Diagnosis Date   BPH (benign prostatic hyperplasia)    CAD (coronary artery disease)    Nonobstructive at cardiac catheterization 2017   Colon polyps    Full dentures    History of colon polyps    History of kidney stones    1970's   HTN (hypertension)    Hyperlipidemia    Lower urinary tract symptoms (LUTS)    Osteoarthritis    Sciatica    Wears glasses     Past Surgical History:  Procedure Laterality  Date   ANTERIOR CERVICAL DECOMP/DISCECTOMY FUSION  05-24-2009   C3 -- C7   APPENDECTOMY  age 32   CARDIAC CATHETERIZATION N/A 07/06/2016   Procedure: Left Heart Cath and Coronary Angiography;  Surgeon: Jettie Booze, MD;  Location: [facility];  Service: Cardiovascular;  Laterality: N/A;   CARDIOVASCULAR STRESS TEST  06-23-2015  dr Daneen Schick   normal lexiscan study/  no perfusion defects or ischemia/  normal LV function and wall motion , ef 57%   CHOLECYSTECTOMY     CORONARY ARTERY BYPASS GRAFT N/A  09/26/2021   Procedure: CORONARY ARTERY BYPASS GRAFTING (CABG) X 3 , USING LEFT INTERNAL MAMMARY ARTERY & RIGHT LEG GREATER SAPHENOUS VEIN HARVESTED ENDOSCOPICALLY;  Surgeon: Lajuana Matte, MD;  Location: [place];  Service: Open Heart Surgery;  Laterality: N/A;   ENDOVEIN HARVEST OF GREATER SAPHENOUS VEIN Right 09/26/2021   Procedure: ENDOVEIN HARVEST OF GREATER SAPHENOUS VEIN;  Surgeon: Lajuana Matte, MD;  Location: [place];  Service: Open Heart Surgery;  Laterality: Right;   LEFT HEART CATH AND CORONARY ANGIOGRAPHY N/A 09/21/2021   Procedure: LEFT HEART CATH AND CORONARY ANGIOGRAPHY;  Surgeon: Lorretta Harp, MD;  Location: [place] [facility];  Service: Cardiovascular;  Laterality: N/A;   LEFT HEART CATHETERIZATION WITH CORONARY ANGIOGRAM N/A 01/01/2013   Procedure: LEFT HEART CATHETERIZATION WITH CORONARY ANGIOGRAM;  Surgeon: Sinclair Grooms, MD;  Location: [facility];  Service: Cardiovascular;  Laterality: N/A;   Intermediate stenosis in  proximal PDA 50-70%;  dRCA 50%;  ostial and mid CFX 50%;  widely patent LAD;  normal LVF, ef 60%   LUMBAR SPINE SURGERY  1990's   SHOULDER SURGERY Left    TEE WITHOUT CARDIOVERSION N/A 09/26/2021   Procedure: TRANSESOPHAGEAL ECHOCARDIOGRAM (TEE);  Surgeon: Lajuana Matte, MD;  Location: [place];  Service: Open Heart Surgery;  Laterality: N/A;   TRANSURETHRAL RESECTION OF PROSTATE N/A 07/04/2015   Procedure: TRANSURETHRAL RESECTION OF THE PROSTATE WITH GYRUS INSTRUMENTS;  Surgeon: Franchot Gallo, MD;  Location: [facility];  Service: Urology;  Laterality: N/A;    Current Medications: Current Meds  Medication Sig   aspirin EC 81 MG tablet Take 81 mg by mouth daily.   ezetimibe (ZETIA) 10 MG tablet Take 10 mg by mouth daily.   glipiZIDE (GLUCOTROL) 5 MG tablet Take 5 mg by mouth every morning.   metoprolol tartrate (LOPRESSOR) 25 MG tablet Take 1 tablet (25 mg total) by mouth 2 (two) times daily.   [DISCONTINUED] clopidogrel  (PLAVIX) 75 MG tablet Take 1 tablet (75 mg total) by mouth daily.   [DISCONTINUED] rosuvastatin (CRESTOR) 20 MG tablet Take 1 tablet (20 mg total) by mouth daily.     Allergies:   Codeine and Lipitor [atorvastatin]   Social History   Socioeconomic History   Marital status: Married    Spouse name: Not on file   Number of children: 3   Years of education: Not on file   Highest education level: Not on file  Occupational History   Occupation: retired  Tobacco Use   Smoking status: Former    Years: 12.00    Types: Cigarettes    Quit date: 06/27/1964    Years since quitting: 57.3   Smokeless tobacco: Never  Vaping Use   Vaping Use: Never used  Substance and Sexual Activity   Alcohol use: No   Drug use: No   Sexual activity: Not on file  Other Topics Concern   Not on  file  Social History Narrative   Not on file   Social Determinants of Health   Financial Resource Strain: Not on file  Food Insecurity: Not on file  Transportation Needs: Not on file  Physical Activity: Not on file  Stress: Not on file  Social Connections: Not on file     Family History: The patient's family history includes Dementia in his father; Heart attack in an other family member. There is no history of Colon cancer, Rectal cancer, or Stomach cancer.  ROS:   Please see the history of present illness. All other systems reviewed and are negative.  Labs/Other Studies Reviewed:    The following studies were reviewed today:  Intraoperative TEE 09/26/21  POST-OP IMPRESSIONS  Limited post-CPB exam: The patient separated easily from CPB.  _ Left Ventricle: The left ventricular function improved with time off of  CPB, and is unchanged from pre-bypass function. There are no regional wall  motion abnormalities. Overall EF 55-60%.  _ Aortic Valve: The aortic valve function appears unchanged from  pre-bypass images.  _ Mitral Valve: Initially the patient had moderate, central MR with  separation from CPB.  This improved with time off of pump. Only mild MR was seen by the end of surgery.  _ Tricuspid Valve: The tricuspid valve function appears unchanged from  pre-bypass images.   PRE-OP FINDINGS   Left Ventricle: The left ventricle has hyperdynamic systolic function,  with an ejection fraction of >65%, measured 67%. The cavity size was  normal. No evidence of left ventricular regional wall motion  abnormalities. There is no left ventricular  hypertrophy. Left ventricular diastolic function was not evaluated.  Right Ventricle: The right ventricle has normal systolic function. The  cavity was normal. There is no increase in right ventricular wall  thickness.  Left Atrium: Left atrial size was normal in size. No left atrial/left  atrial appendage thrombus was detected. Left atrial appendage velocity is  normal at greater than 40 cm/s.  Right Atrium: Right atrial size was normal in size.  Interatrial Septum: No atrial level shunt detected by color flow Doppler.  There is no evidence of a patent foramen ovale.  Pericardium: There is no evidence of pericardial effusion.  Mitral Valve: The mitral valve is normal in structure. Mitral valve  regurgitation is trivial by color flow Doppler. There is no evidence of  mitral valve vegetation. Pulmonary venous flow is normal. There is no  evidence of mitral stenosis, with peak gradient 3 mmHg.  Tricuspid Valve: The tricuspid valve was normal in structure. Tricuspid  valve regurgitation is trivial by color flow Doppler. No evidence of  tricuspid stenosis is present. There is no evidence of tricuspid valve  vegetation.  Aortic Valve: The aortic valve is tricuspid. Aortic valve regurgitation  was not visualized by color flow Doppler. There is no stenosis of the  aortic valve, with peak gradient 10 mmHg, mean gradient 5 mmHg. There is  no evidence of aortic valve vegetation.  Pulmonic Valve: The pulmonic valve was normal in structure, with normal  leaflet  excursion. No evidence of pumonic stenosis.  Pulmonic valve regurgitation is not visualized by color flow Doppler.  Aorta: The aortic root and ascending aorta are normal in size and  structure. The aortic arch was not well visualized. There is evidence of  scattered plaque in the descending aorta; Grade III, measuring 3-56mm in  size.  Pulmonary Artery: The pulmonary artery is of normal size.  Venous: The inferior vena cava is normal  in size with greater than 50%  respiratory variability, suggesting right atrial pressure of 3 mmHg.  Shunts: There is no evidence of an atrial septal defect.    LHC 09/21/21    2nd Mrg lesion is 75% stenosed.   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   Mid LM lesion is 90% stenosed.   The left ventricular systolic function is normal.   LV end diastolic pressure is mildly elevated.   The left ventricular ejection fraction is 50-55% by visual estimate.  IMPRESSION: Mr. Henegar has left main/three-vessel disease.  He was presented with chest pain and had a "non-STEMI".  He said no further pain since hospitalization and IV heparin.  He will need CABG for complete revascularization.  The sheath was removed and a TR band was placed on the right wrist to achieve patent hemostasis.  The patient left lab in stable condition.  Heparin will be restarted in 2 hours without a bolus    Recent Labs: 09/25/2021: ALT 31 09/27/2021: Magnesium 2.2 09/29/2021: Hemoglobin 10.7; Platelets 134 10/01/2021: BUN 13; Creatinine, Ser 1.04; Potassium 3.5; Sodium 137  Recent Lipid Panel    Component Value Date/Time   CHOL 139 09/21/2021 0353   TRIG 142 09/21/2021 0353   HDL 32 (L) 09/21/2021 0353   CHOLHDL 4.3 09/21/2021 0353   VLDL 28 09/21/2021 0353   LDLCALC 79 09/21/2021 0353     Physical Exam:    VS:  BP 128/72    Pulse 84    Ht 5\' 9"  (1.753 m)    Wt 212 lb (96.2 kg)    BMI 31.31 kg/m     Wt Readings from Last 3 Encounters:  10/24/21 212 lb (96.2 kg)  09/30/21 219 lb 5.7 oz (99.5  kg)  03/03/20 219 lb (99.3 kg)     GEN:  Well nourished, well developed in no acute distress HEENT: Normal NECK: No JVD; No carotid bruits LYMPHATICS: No lymphadenopathy CARDIAC: RRR, no murmurs, rubs, gallops RESPIRATORY:  Clear to auscultation without rales, wheezing or rhonchi  ABDOMEN: Soft, non-tender, non-distended MUSCULOSKELETAL:  No edema; No deformity  SKIN: Warm and dry. Sternal incision and right leg incisions clean and dry with well-approximated edges and no s/s of infection.  NEUROLOGIC:  Alert and oriented x 3 PSYCHIATRIC:  Normal affect   EKG:  EKG is ordered today. The ekg ordered today demonstrates NSR at rate of 84 bpm, poor RWP, Inferior Q wave lead III, stable, no acute changes from previous  Diagnoses:    1. Coronary artery disease involving native coronary artery of native heart without angina pectoris   2. S/P CABG x 3   3. Hyperlipidemia LDL goal <70   4. Essential hypertension   5. Episodic lightheadedness    Assessment and Plan:     CAD without angina/s/p CABG x 3: He denies chest pain, dyspnea or other symptoms concerning for angina since hospital discharge.  He is following guidance from Dr. Kipp Brood for surgical clearance, but is gradually increasing activity and is feeling well. He states he is not interested in cardiac rehab at this time because he is so active at home. Advised him of bleeding precautions due to DAPT. Will get CBC in 3 months. Continue metoprolol, aspirin, Plavix.  Episodic lightheadedness: He reports symptoms of lightheadedness only when waking from sleep in the mornings.  He is careful to get out of bed slowly and has not had any falls. Encouraged increased water intake.  He monitors blood pressures at home and will report  any abnormal readings to [place].  Essential hypertension: Blood pressure is stable today.  He reports similar readings at home.  Encouraged low-sodium diet and 150 minutes of moderate exercise each week. Will recheck  BMET in 3 months. Continue metoprolol.  Hyperlipidemia LDL goal < 70: LDL 09/21/21 = 79.  Encouraged heart healthy diet and regular activity including 150 minutes moderate exercise each week.  We will recheck lipids/liver panel in 3 months. Continue rosuvastatin, ezetimibe.      Cardiac Rehabilitation Eligibility Assessment  The patient has declined or is not appropriate for cardiac rehabilitation.    Disposition: F/u in 3 months with Dr. Tamala Julian with fasting labs prior    Medication Adjustments/Labs and Tests Ordered: Current medicines are reviewed at length with the patient today.  Concerns regarding medicines are outlined above.  Orders Placed This Encounter  Procedures   Lipid panel   Comprehensive metabolic panel   CBC   EKG 12-Lead   Meds ordered this encounter  Medications   rosuvastatin (CRESTOR) 20 MG tablet    Sig: Take 1 tablet (20 mg total) by mouth daily.    Dispense:  90 tablet    Refill:  3    Order Specific Question:   Supervising Provider    Answer:   Thayer Headings [[id number]]   clopidogrel (PLAVIX) 75 MG tablet    Sig: Take 1 tablet (75 mg total) by mouth daily.    Dispense:  90 tablet    Refill:  3    Order Specific Question:   Supervising Provider    Answer:   Thayer Headings [phone]    Patient Instructions  Medication Instructions:  Your Physician recommend you continue on your current medication as directed.    *If you need a refill on your cardiac medications before your next appointment, please call your pharmacy*   Lab Work: Your physician recommends that you return for lab work in April for a fasting Lipid Panel, CMET, and CBC  If you have labs (blood work) drawn today and your tests are completely normal, you will receive your results only by: MyChart Message (if you have MyChart) OR A paper copy in the mail If you have any lab test that is abnormal or we need to change your treatment, we will call you to review the  results.   Testing/Procedures: None ordered today    Follow-Up: At [facility], you and your health needs are our priority.  As part of our continuing mission to provide you with exceptional heart care, we have created designated Provider Care Teams.  These Care Teams include your primary Cardiologist (physician) and Advanced Practice Providers (APPs -  Physician Assistants and Nurse Practitioners) who all work together to provide you with the care you need, when you need it.  We recommend signing up for the patient portal called "MyChart".  Sign up information is provided on this After Visit Summary.  MyChart is used to connect with patients for Virtual Visits (Telemedicine).  Patients are able to view lab/test results, encounter notes, upcoming appointments, etc.  Non-urgent messages can be sent to your provider as well.   To learn more about what you can do with MyChart, go to [email or web address].    Your next appointment:   Follow up as scheduled in April with Dr. Tamala Julian   The format for your next appointment:   In Person  Provider:   Sinclair Grooms, MD     Other Instructions    Signed, Averleigh Savary,  Lanice Schwab, NP  10/24/2021 10:26 AM    [place]

## 2021-10-24 ENCOUNTER — Ambulatory Visit (HOSPITAL_BASED_OUTPATIENT_CLINIC_OR_DEPARTMENT_OTHER): Payer: Medicare Other | Admitting: Nurse Practitioner

## 2021-10-24 ENCOUNTER — Other Ambulatory Visit: Payer: Self-pay

## 2021-10-24 ENCOUNTER — Encounter (HOSPITAL_BASED_OUTPATIENT_CLINIC_OR_DEPARTMENT_OTHER): Payer: Self-pay | Admitting: Nurse Practitioner

## 2021-10-24 VITALS — BP 128/72 | HR 84 | Ht 69.0 in | Wt 212.0 lb

## 2021-10-24 DIAGNOSIS — E785 Hyperlipidemia, unspecified: Secondary | ICD-10-CM | POA: Diagnosis not present

## 2021-10-24 DIAGNOSIS — I2510 Atherosclerotic heart disease of native coronary artery without angina pectoris: Secondary | ICD-10-CM | POA: Diagnosis not present

## 2021-10-24 DIAGNOSIS — R42 Dizziness and giddiness: Secondary | ICD-10-CM

## 2021-10-24 DIAGNOSIS — I10 Essential (primary) hypertension: Secondary | ICD-10-CM | POA: Diagnosis not present

## 2021-10-24 DIAGNOSIS — Z951 Presence of aortocoronary bypass graft: Secondary | ICD-10-CM

## 2021-10-24 MED ORDER — CLOPIDOGREL BISULFATE 75 MG PO TABS
75.0000 mg | ORAL_TABLET | Freq: Every day | ORAL | 3 refills | Status: DC
Start: 2021-10-24 — End: 2023-07-18

## 2021-10-24 MED ORDER — ROSUVASTATIN CALCIUM 20 MG PO TABS: 20.0000 mg | ORAL_TABLET | Freq: Every day | ORAL | 3 refills | Status: DC

## 2021-10-24 NOTE — Patient Instructions (Addendum)
Medication Instructions:  Your Physician recommend you continue on your current medication as directed.    *If you need a refill on your cardiac medications before your next appointment, please call your pharmacy*   Lab Work: Your physician recommends that you return for lab work in April for a fasting Lipid Panel, CMET, and CBC  If you have labs (blood work) drawn today and your tests are completely normal, you will receive your results only by: MyChart Message (if you have MyChart) OR A paper copy in the mail If you have any lab test that is abnormal or we need to change your treatment, we will call you to review the results.   Testing/Procedures: None ordered today    Follow-Up: At [facility], you and your health needs are our priority.  As part of our continuing mission to provide you with exceptional heart care, we have created designated Provider Care Teams.  These Care Teams include your primary Cardiologist (physician) and Advanced Practice Providers (APPs -  Physician Assistants and Nurse Practitioners) who all work together to provide you with the care you need, when you need it.  We recommend signing up for the patient portal called "MyChart".  Sign up information is provided on this After Visit Summary.  MyChart is used to connect with patients for Virtual Visits (Telemedicine).  Patients are able to view lab/test results, encounter notes, upcoming appointments, etc.  Non-urgent messages can be sent to your provider as well.   To learn more about what you can do with MyChart, go to [email or web address].    Your next appointment:   Follow up as scheduled in April with Dr. Tamala Julian   The format for your next appointment:   In Person  Provider:   Sinclair Grooms, MD     Other Instructions

## 2021-11-07 ENCOUNTER — Other Ambulatory Visit: Payer: Self-pay | Admitting: Thoracic Surgery (Cardiothoracic Vascular Surgery)

## 2021-11-07 DIAGNOSIS — Z951 Presence of aortocoronary bypass graft: Secondary | ICD-10-CM

## 2021-11-08 ENCOUNTER — Ambulatory Visit (INDEPENDENT_AMBULATORY_CARE_PROVIDER_SITE_OTHER): Payer: Self-pay | Admitting: Physician Assistant

## 2021-11-08 ENCOUNTER — Encounter: Payer: Self-pay | Admitting: Physician Assistant

## 2021-11-08 ENCOUNTER — Ambulatory Visit
Admission: RE | Admit: 2021-11-08 | Discharge: 2021-11-08 | Disposition: A | Discharge status: Home / Self Care | Payer: Medicare Other | Source: Ambulatory Visit | Attending: Surgery | Admitting: Surgery

## 2021-11-08 ENCOUNTER — Other Ambulatory Visit: Payer: Self-pay

## 2021-11-08 VITALS — BP 99/66 | HR 90 | Resp 20 | Wt 212.0 lb

## 2021-11-08 DIAGNOSIS — Z951 Presence of aortocoronary bypass graft: Secondary | ICD-10-CM

## 2021-11-08 DIAGNOSIS — R918 Other nonspecific abnormal finding of lung field: Secondary | ICD-10-CM | POA: Diagnosis not present

## 2021-11-08 NOTE — Patient Instructions (Signed)
Continue to observe sternal precautions for another 6 weeks.  May resume driving  Follow-up with this practice as needed.`

## 2021-11-08 NOTE — Progress Notes (Signed)
[address]       [place] [address]             [phone]       HPI: William [name] is an 81 year old male with history of hypertension, dyslipidemia, and unstable angina who presented with non-ST elevation myocardial infarction in late November of last year.  He subsequently underwent CABG x3 on 09/26/2021 by Dr. Kipp Brood.  Postoperative course was significant for atrial fibrillation initially treated with amiodarone.  Converted back to stable sinus rhythm.  He did not tolerate the oral amiodarone due to nausea so it was discontinued.  He was discharged on metoprolol, aspirin, rosuvastatin, and clopidogrel. Patient returns for routine postoperative follow-up.    Since hospital discharge the patient reports no problems or concerns. He is continuing to gain strength. He said he has not used any pain medication since discharge from the hospital. He denies any chest pain, palpitations, or shortness of breath. He walks for exercise.    Current Outpatient Medications  Medication Sig Dispense Refill   aspirin EC 81 MG tablet Take 81 mg by mouth daily.     clopidogrel (PLAVIX) 75 MG tablet Take 1 tablet (75 mg total) by mouth daily. 90 tablet 3   ezetimibe (ZETIA) 10 MG tablet Take 10 mg by mouth daily.     glipiZIDE (GLUCOTROL) 5 MG tablet Take 5 mg by mouth every morning.     metoprolol tartrate (LOPRESSOR) 25 MG tablet Take 1 tablet (25 mg total) by mouth 2 (two) times daily. 60 tablet 2   rosuvastatin (CRESTOR) 20 MG tablet Take 1 tablet (20 mg total) by mouth daily. 90 tablet 3   No current facility-administered medications for this visit.    Physical Exam Vital signs BP 99/66 Pulse 90 Respirations 20 SPO2 95% on room air  General: Very pleasant 81 year old male who appears less than his stated age.  No acute distress.  He is accompanied by his wife today. Heart: Regular rate and rhythm, no murmur. Chest: Breath sounds are clear to auscultation anteriorly and  posteriorly.  Sternotomy incision is well-healed and the sternum is stable.  Chest x-ray reviewed and shows continued resolution with small left pleural effusion.  Resolution of the small left lower lung zone infiltrate. Extremities: All warm well perfused.  No peripheral edema.  The right lower extremity EVH incision is healing with no sign of complication.  Diagnostic Tests: CLINICAL DATA:  Status post coronary artery bypass   EXAM: CHEST - 2 VIEW   COMPARISON:  Previous studies including the examination of 10/01/2021   FINDINGS: Cardiac size is within normal limits. There is evidence of previous coronary bypass surgery. There is interval resolution of infiltrate in the left lower lung fields. There is improvement in the blunting of left posterior costophrenic angle. There is no pneumothorax.   IMPRESSION: There is interval resolution of infiltrate in the left lower lung fields. There are no new infiltrates or signs of pulmonary edema. There is blunting of left posterior costophrenic angle which may be due to small residual effusion or pleural thickening.     Electronically Signed   By: Elmer Picker M.D.   On: 11/08/2021 13:35  Impression / Plan: William [name] is making excellent progress towards status post CABG x3 after presenting with acute coronary syndrome.  He is encouraged to gradually advance his activity.  Continue to observe sternal precautions for another 6 weeks.  May resume driving.  No change in medications.  Follow-up with CT surgery as needed.  He has scheduled follow-up with Dr. Daneen Schick in April.  Antony Odea, PA-C [facility] [phone]

## 2021-12-27 ENCOUNTER — Other Ambulatory Visit: Payer: Self-pay | Admitting: Physician Assistant

## 2021-12-29 ENCOUNTER — Telehealth: Payer: Self-pay | Admitting: Interventional Cardiology

## 2021-12-29 NOTE — Telephone Encounter (Signed)
Returned call and spoke to pt's wife (on [place]) who initiated call today and states that pt has been having intermittent high BP, but for last 2 days, it's been very high upon awakening. Provided following readings: ?3/9 192/88 (8am) 142/79 (1pm) ?3/10 170/100 (8am)  140/89 (1-2pm) ? ?Wife states that she did purchase a new BP kit yesterday to rule out machine issues, but the new meter is a wrist meter. Compared readings of new vs old machine and states they are only 5-10 points off. Pt denies SOB, CP, vision changes. Does state that in the mornings when pressure has read this high, that he has felt dizzy. Wife also notes that his sugar which she states has been "running really good since getting out of the hospital, in the low 100's" has been higher in the last 2 days-unable to provide exact readings, but condones that pt has not missed any doses of glipizide. Wife states that Am reading is fresh out of bed and before any medication, and time of second reading reflects AM dose of Metoprolol Tartrate 25mg . Says that he takes his second dose of Metoprolol Tartrate between 6-7pm daily. Informed wife that it may be that the medication is out of his system by the morning and potentially he needs to be on an extended release form, but will send to Dr Tamala Julian (he has scheduled appt on 4/4 with Tamala Julian) for recommendation.  ?

## 2021-12-29 NOTE — Telephone Encounter (Signed)
Pt c/o BP issue: STAT if pt c/o blurred vision, one-sided weakness or slurred speech ? ?1. What are your last 5 BP readings?  ?192/88 ?142/79 ?170/100 ?140/89 ? ?2. Are you having any other symptoms (ex. Dizziness, headache, blurred vision, passed out)? Dizzy, lightheaded, blood sugar is up  ? ?3. What is your BP issue? Patient blood pressure is high and fluctuating.. please advise  ? ?

## 2021-12-29 NOTE — Telephone Encounter (Signed)
Belva Crome, MD  You 2 hours ago (1:09 PM)  ? ?Do not take BP before morning meds. I need to know what the BP is 2-3 hours after the morning medication.At his age, target is 140/80 mmHg. If BP > 150/90 mmHg then additional therapy will be warranted.   ? ?I have spoke with Arbie Cookey (pt's wife on [place]) and reiterated the advice given earlier which coincides with Dr Thompson Caul recommendation to check BP 2 hours post-medication to evaluate if medication is properly lowering pressure. Informed wife that based on the numbers she gave earlier, this does appear to be working properly. Made aware of target goal BP 140/80 and knows to call us if his BP is greater than 150/90 consistently or pt begins to develop symptoms. ?

## 2022-01-08 DIAGNOSIS — I2510 Atherosclerotic heart disease of native coronary artery without angina pectoris: Secondary | ICD-10-CM | POA: Diagnosis not present

## 2022-01-08 LAB — CBC
Hematocrit: 48.6 % (ref 37.5–51.0)
Hemoglobin: 15.7 g/dL (ref 13.0–17.7)
MCH: 28.8 pg (ref 26.6–33.0)
MCHC: 32.3 g/dL (ref 31.5–35.7)
MCV: 89 fL (ref 79–97)
Platelets: 212 10*3/uL (ref 150–450)
RBC: 5.45 x10E6/uL (ref 4.14–5.80)
RDW: 13.2 % (ref 11.6–15.4)
WBC: 9.3 10*3/uL (ref 3.4–10.8)

## 2022-01-09 LAB — COMPREHENSIVE METABOLIC PANEL
ALT: 13 IU/L (ref 0–44)
AST: 18 IU/L (ref 0–40)
Albumin/Globulin Ratio: 1.5 (ref 1.2–2.2)
Albumin: 4.4 g/dL (ref 3.6–4.6)
Alkaline Phosphatase: 86 IU/L (ref 44–121)
BUN/Creatinine Ratio: 11 (ref 10–24)
BUN: 10 mg/dL (ref 8–27)
Bilirubin Total: 0.5 mg/dL (ref 0.0–1.2)
CO2: 23 mmol/L (ref 20–29)
Calcium: 9.7 mg/dL (ref 8.6–10.2)
Chloride: 103 mmol/L (ref 96–106)
Creatinine, Ser: 0.93 mg/dL (ref 0.76–1.27)
Globulin, Total: 3 g/dL (ref 1.5–4.5)
Glucose: 171 mg/dL — ABNORMAL HIGH (ref 70–99)
Potassium: 4.3 mmol/L (ref 3.5–5.2)
Sodium: 141 mmol/L (ref 134–144)
Total Protein: 7.4 g/dL (ref 6.0–8.5)
eGFR: 82 mL/min/{1.73_m2} (ref >59)

## 2022-01-09 LAB — LIPID PANEL
Chol/HDL Ratio: 2.3 ratio (ref 0.0–5.0)
Cholesterol, Total: 96 mg/dL — ABNORMAL LOW (ref 100–199)
HDL: 42 mg/dL (ref >39)
LDL Chol Calc (NIH): 36 mg/dL (ref 0–99)
Triglycerides: 96 mg/dL (ref 0–149)
VLDL Cholesterol Cal: 18 mg/dL (ref 5–40)

## 2022-01-22 NOTE — Progress Notes (Signed)
?Cardiology Office Note:   ? ?Date:  01/23/2022  ? ?ID:  William [name], DOB [birth year], MRN [id number] ? ?PCP:  Lawerance Cruel, MD  ?Cardiologist:  Sinclair Grooms, MD  ? ?Referring MD: Lawerance Cruel, MD  ? ?Chief Complaint  ?Patient presents with  ? Coronary Artery Disease  ? Hypertension  ? Hyperlipidemia  ? Thoracic Aortic Aneurysm  ? ? ?History of Present Illness:   ? ?William [name] is a 81 y.o. male with a hx of CAD s/p CABG x 3 on 09/26/21 for left main disease (LIMA to LAD, reverse saphenous vein graft to OM 2, reverse saphenous vein graft to PDA), hypertension, T2DM, obesity, and hyperlipidemia. Previous cardiac cath in 2017 revealed non-obstructive CAD with RPDA lesion 50% stenosed, normal LVEF. ? ? ?He is doing well post bypass surgery.  No angina.  Breastbone is healed quite nicely.  He has been doing yard work.  He denies shortness of breath lower extremity swelling, and medication side effects.  He is accompanied by his wife.  Was having some blood pressure instability.  Once he started taking the metoprolol 12 hours apart and the blood pressure became more stable. ? ?Past Medical History:  ?Diagnosis Date  ? BPH (benign prostatic hyperplasia)   ? CAD (coronary artery disease)   ? Nonobstructive at cardiac catheterization 2017  ? Colon polyps   ? Full dentures   ? History of colon polyps   ? History of kidney stones   ? 1970's  ? HTN (hypertension)   ? Hyperlipidemia   ? Lower urinary tract symptoms (LUTS)   ? Osteoarthritis   ? Sciatica   ? Wears glasses   ? ? ?Past Surgical History:  ?Procedure Laterality Date  ? ANTERIOR CERVICAL DECOMP/DISCECTOMY FUSION  05-24-2009  ? C3 -- C7  ? APPENDECTOMY  age 89  ? CARDIAC CATHETERIZATION N/A 07/06/2016  ? Procedure: Left Heart Cath and Coronary Angiography;  Surgeon: Jettie Booze, MD;  Location: [facility];  Service: Cardiovascular;  Laterality: N/A;  ? CARDIOVASCULAR STRESS TEST  06-23-2015  dr Daneen Schick  ? normal lexiscan study/   no perfusion defects or ischemia/  normal LV function and wall motion , ef 57%  ? CHOLECYSTECTOMY    ? CORONARY ARTERY BYPASS GRAFT N/A 09/26/2021  ? Procedure: CORONARY ARTERY BYPASS GRAFTING (CABG) X 3 , USING LEFT INTERNAL MAMMARY ARTERY & RIGHT LEG GREATER SAPHENOUS VEIN HARVESTED ENDOSCOPICALLY;  Surgeon: Lajuana Matte, MD;  Location: [facility];  Service: Open Heart Surgery;  Laterality: N/A;  ? ENDOVEIN HARVEST OF GREATER SAPHENOUS VEIN Right 09/26/2021  ? Procedure: ENDOVEIN HARVEST OF GREATER SAPHENOUS VEIN;  Surgeon: Lajuana Matte, MD;  Location: [place];  Service: Open Heart Surgery;  Laterality: Right;  ? LEFT HEART CATH AND CORONARY ANGIOGRAPHY N/A 09/21/2021  ? Procedure: LEFT HEART CATH AND CORONARY ANGIOGRAPHY;  Surgeon: Lorretta Harp, MD;  Location: [facility];  Service: Cardiovascular;  Laterality: N/A;  ? LEFT HEART CATHETERIZATION WITH CORONARY ANGIOGRAM N/A 01/01/2013  ? Procedure: LEFT HEART CATHETERIZATION WITH CORONARY ANGIOGRAM;  Surgeon: Sinclair Grooms, MD;  Location: [facility];  Service: Cardiovascular;  Laterality: N/A;   Intermediate stenosis in  proximal PDA 50-70%;  dRCA 50%;  ostial and mid CFX 50%;  widely patent LAD;  normal LVF, ef 60%  ? LUMBAR SPINE SURGERY  1990's  ? SHOULDER SURGERY Left   ? TEE WITHOUT CARDIOVERSION N/A 09/26/2021  ?  Procedure: TRANSESOPHAGEAL ECHOCARDIOGRAM (TEE);  Surgeon: Lajuana Matte, MD;  Location: [facility];  Service: Open Heart Surgery;  Laterality: N/A;  ? TRANSURETHRAL RESECTION OF PROSTATE N/A 07/04/2015  ? Procedure: TRANSURETHRAL RESECTION OF THE PROSTATE WITH GYRUS INSTRUMENTS;  Surgeon: Franchot Gallo, MD;  [facility];  Service: Urology;  Laterality: N/A;  ? ? ?Current Medications: ?Current Meds  ?Medication Sig  ? aspirin EC 81 MG tablet Take 81 mg by mouth daily.  ? clopidogrel (PLAVIX) 75 MG tablet Take 1 tablet (75 mg total) by mouth daily.  ? ezetimibe (ZETIA) 10 MG tablet Take 10 mg by  mouth daily.  ? glipiZIDE (GLUCOTROL) 5 MG tablet Take 5 mg by mouth every morning.  ? rosuvastatin (CRESTOR) 20 MG tablet Take 1 tablet (20 mg total) by mouth daily.  ? [DISCONTINUED] metoprolol tartrate (LOPRESSOR) 25 MG tablet Take 1 tablet (25 mg total) by mouth 2 (two) times daily.  ?  ? ?Allergies:   Codeine and Lipitor [atorvastatin]  ? ?Social History  ? ?Socioeconomic History  ? Marital status: Married  ?  Spouse name: Not on file  ? Number of children: 3  ? Years of education: Not on file  ? Highest education level: Not on file  ?Occupational History  ? Occupation: retired  ?Tobacco Use  ? Smoking status: Former  ?  Years: 12.00  ?  Types: Cigarettes  ?  Quit date: 06/27/1964  ?  Years since quitting: 57.6  ? Smokeless tobacco: Never  ?Vaping Use  ? Vaping Use: Never used  ?Substance and Sexual Activity  ? Alcohol use: No  ? Drug use: No  ? Sexual activity: Not on file  ?Other Topics Concern  ? Not on file  ?Social History Narrative  ? Not on file  ? ?Social Determinants of Health  ? ?Financial Resource Strain: Not on file  ?Food Insecurity: Not on file  ?Transportation Needs: Not on file  ?Physical Activity: Not on file  ?Stress: Not on file  ?Social Connections: Not on file  ?  ? ?Family History: ?The patient's family history includes Dementia in his father; Heart attack in an other family member. There is no history of Colon cancer, Rectal cancer, or Stomach cancer. ? ?ROS:   ?Please see the history of present illness.    ?No difficulty sleeping.  Appetite has been stable.  Able to mow his grass without difficulty.  All other systems reviewed and are negative. ? ?EKGs/Labs/Other Studies Reviewed:   ? ?The following studies were reviewed today: ?No new data ? ?EKG:  EKG tracing from November 20, 2021 demonstrates normal sinus rhythm with first-degree AV block and poor R wave progression.  A new tracing is not performed today. ? ?Recent Labs: ?09/27/2021: Magnesium 2.2 ?01/08/2022: ALT 13; BUN 10; Creatinine,  Ser 0.93; Hemoglobin 15.7; Platelets 212; Potassium 4.3; Sodium 141  ?Recent Lipid Panel ?   ?Component Value Date/Time  ? CHOL 96 (L) 01/08/2022 0835  ? TRIG 96 01/08/2022 0835  ? HDL 42 01/08/2022 0835  ? CHOLHDL 2.3 01/08/2022 0835  ? CHOLHDL 4.3 09/21/2021 0353  ? VLDL 28 09/21/2021 0353  ? [place] 36 01/08/2022 0835  ? ? ?Physical Exam:   ? ?VS:  BP 122/78   Pulse 75   Ht '5\' 9"'$  (1.753 m)   Wt 215 lb (97.5 kg)   SpO2 95%   BMI 31.75 kg/m?    ? ?Wt Readings from Last 3 Encounters:  ?01/23/22 215 lb (97.5 kg)  ?  11/08/21 212 lb (96.2 kg)  ?10/24/21 212 lb (96.2 kg)  ?  ? ?GEN: Overweight. No acute distress ?HEENT: Normal ?NECK: No JVD. ?LYMPHATICS: No lymphadenopathy ?CARDIAC: No murmur. RRR S4 but no gallop, or edema. ?VASCULAR:  Normal Pulses. No bruits. ?RESPIRATORY:  Clear to auscultation without rales, wheezing or rhonchi  ?ABDOMEN: Soft, non-tender, non-distended, No pulsatile mass, ?MUSCULOSKELETAL: No deformity  ?SKIN: Warm and dry ?NEUROLOGIC:  Alert and oriented x 3 ?PSYCHIATRIC:  Normal affect  ? ?ASSESSMENT:   ? ?1. S/P CABG x 3   ?2. Hyperlipidemia LDL goal <70   ?3. Essential hypertension   ? ?PLAN:   ? ?In order of problems listed above: ? ?Secondary prevention reviewed.  Continue Plavix and drop aspirin.  May return to 81 mg aspirin in December 2023.  He is doing great post CABG. ?Continue rosuvastatin 20 mg/day. ?Blood pressure control is excellent on metoprolol.  Target at his age is 140/80 mmHg.  Low-salt diet. ? ? ?Overall education and awareness concerning secondary risk prevention was discussed in detail: LDL less than 70, hemoglobin A1c less than 7, blood pressure target less than 130/80 mmHg, >150 minutes of moderate aerobic activity per week, avoidance of smoking, weight control (via diet and exercise), and continued surveillance/management of/for obstructive sleep apnea. ? ? ? ?Medication Adjustments/Labs and Tests Ordered: ?Current medicines are reviewed at length with the patient  today.  Concerns regarding medicines are outlined above.  ?No orders of the defined types were placed in this encounter. ? ?No orders of the defined types were placed in this encounter. ? ? ?There are no Patie

## 2022-01-23 ENCOUNTER — Encounter: Payer: Self-pay | Admitting: Interventional Cardiology

## 2022-01-23 ENCOUNTER — Ambulatory Visit: Payer: Medicare Other | Admitting: Interventional Cardiology

## 2022-01-23 VITALS — BP 122/78 | HR 75 | Ht 69.0 in | Wt 215.0 lb

## 2022-01-23 DIAGNOSIS — I10 Essential (primary) hypertension: Secondary | ICD-10-CM | POA: Diagnosis not present

## 2022-01-23 DIAGNOSIS — E785 Hyperlipidemia, unspecified: Secondary | ICD-10-CM | POA: Diagnosis not present

## 2022-01-23 DIAGNOSIS — Z951 Presence of aortocoronary bypass graft: Secondary | ICD-10-CM

## 2022-01-23 MED ORDER — METOPROLOL SUCCINATE ER 25 MG PO TB24: 25.0000 mg | ORAL_TABLET | Freq: Every day | ORAL | 3 refills | Status: DC

## 2022-01-23 NOTE — Patient Instructions (Signed)
Medication Instructions:  ?1) DISCONTINUE Metoprolol Tartrate ?2) START Metoprolol Succinate '50mg'$  once daily ? ?*If you need a refill on your cardiac medications before your next appointment, please call your pharmacy* ? ? ?Lab Work: ?None ?If you have labs (blood work) drawn today and your tests are completely normal, you will receive your results only by: ?MyChart Message (if you have MyChart) OR ?A paper copy in the mail ?If you have any lab test that is abnormal or we need to change your treatment, we will call you to review the results. ? ? ?Testing/Procedures: ?None ? ? ?Follow-Up: ?At [facility], you and your health needs are our priority.  As part of our continuing mission to provide you with exceptional heart care, we have created designated Provider Care Teams.  These Care Teams include your primary Cardiologist (physician) and Advanced Practice Providers (APPs -  Physician Assistants and Nurse Practitioners) who all work together to provide you with the care you need, when you need it. ? ?We recommend signing up for the patient portal called "MyChart".  Sign up information is provided on this After Visit Summary.  MyChart is used to connect with patients for Virtual Visits (Telemedicine).  Patients are able to view lab/test results, encounter notes, upcoming appointments, etc.  Non-urgent messages can be sent to your provider as well.   ?To learn more about what you can do with MyChart, go to [email or web address].   ? ?Your next appointment:   ?6 month(s) ? ?The format for your next appointment:   ?In Person ? ?Provider:   ?Sinclair Grooms, MD  ? ? ?Other Instructions ?  ?

## 2022-02-15 DIAGNOSIS — R42 Dizziness and giddiness: Secondary | ICD-10-CM | POA: Diagnosis not present

## 2022-02-15 DIAGNOSIS — E1169 Type 2 diabetes mellitus with other specified complication: Secondary | ICD-10-CM | POA: Diagnosis not present

## 2022-03-24 DIAGNOSIS — Z4802 Encounter for removal of sutures: Secondary | ICD-10-CM | POA: Diagnosis not present

## 2022-03-25 ENCOUNTER — Other Ambulatory Visit: Payer: Self-pay | Admitting: Physician Assistant

## 2022-04-13 DIAGNOSIS — H8113 Benign paroxysmal vertigo, bilateral: Secondary | ICD-10-CM | POA: Diagnosis not present

## 2022-07-22 NOTE — Progress Notes (Signed)
Cardiology Office Note:    Date:  07/23/2022   ID:  William [name] [facility], DOB [birth year], MRN [id number]  PCP:  Lawerance Cruel, MD  Cardiologist:  Sinclair Grooms, MD   Referring MD: Lawerance Cruel, MD   Chief Complaint  Patient presents with   Coronary Artery Disease   Hyperlipidemia    History of Present Illness:    William [name] is a 81 y.o. male with a hx of CAD s/p CABG x 3 on 09/26/21 for left main disease (LIMA to LAD, reverse saphenous vein graft to OM 2, reverse saphenous vein graft to PDA), hypertension, T2DM, obesity, and hyperlipidemia. Previous cardiac cath in 2017 revealed non-obstructive CAD with RPDA lesion 50% stenosed, normal LVEF.  2 weeks ago the patient had a greater than 10-minute episode of sharp shooting pain along the left mid lower sternal region that radiated up into the parasternal region.  The discomfort will come and go in waves.  He was sitting when it occurred.  There is no associated palpitation, dyspnea, or radiation.  It resolved spontaneously.  It was dissimilar to previous surgical pain.  He is physically active.  No medication side effects.  Past Medical History:  Diagnosis Date   BPH (benign prostatic hyperplasia)    CAD (coronary artery disease)    Nonobstructive at cardiac catheterization 2017   Colon polyps    Full dentures    History of colon polyps    History of kidney stones    1970's   HTN (hypertension)    Hyperlipidemia    Lower urinary tract symptoms (LUTS)    Osteoarthritis    Sciatica    Wears glasses     Past Surgical History:  Procedure Laterality Date   ANTERIOR CERVICAL DECOMP/DISCECTOMY FUSION  05-24-2009   C3 -- C7   APPENDECTOMY  age 29   CARDIAC CATHETERIZATION N/A 07/06/2016   Procedure: Left Heart Cath and Coronary Angiography;  Surgeon: Jettie Booze, MD;  Location: [place] [facility];  Service: Cardiovascular;  Laterality: N/A;   CARDIOVASCULAR STRESS TEST  06-23-2015  dr Daneen Schick    normal lexiscan study/  no perfusion defects or ischemia/  normal LV function and wall motion , ef 57%   CHOLECYSTECTOMY     CORONARY ARTERY BYPASS GRAFT N/A 09/26/2021   Procedure: CORONARY ARTERY BYPASS GRAFTING (CABG) X 3 , USING LEFT INTERNAL MAMMARY ARTERY & RIGHT LEG GREATER SAPHENOUS VEIN HARVESTED ENDOSCOPICALLY;  Surgeon: Lajuana Matte, MD;  Location: [place];  Service: Open Heart Surgery;  Laterality: N/A;   ENDOVEIN HARVEST OF GREATER SAPHENOUS VEIN Right 09/26/2021   Procedure: ENDOVEIN HARVEST OF GREATER SAPHENOUS VEIN;  Surgeon: Lajuana Matte, MD;  Location: [place];  Service: Open Heart Surgery;  Laterality: Right;   LEFT HEART CATH AND CORONARY ANGIOGRAPHY N/A 09/21/2021   Procedure: LEFT HEART CATH AND CORONARY ANGIOGRAPHY;  Surgeon: Lorretta Harp, MD;  Location: [place] [facility];  Service: Cardiovascular;  Laterality: N/A;   LEFT HEART CATHETERIZATION WITH CORONARY ANGIOGRAM N/A 01/01/2013   Procedure: LEFT HEART CATHETERIZATION WITH CORONARY ANGIOGRAM;  Surgeon: Sinclair Grooms, MD;  Location: [facility];  Service: Cardiovascular;  Laterality: N/A;   Intermediate stenosis in  proximal PDA 50-70%;  dRCA 50%;  ostial and mid CFX 50%;  widely patent LAD;  normal LVF, ef 60%   LUMBAR SPINE SURGERY  1990's   SHOULDER SURGERY Left    TEE WITHOUT CARDIOVERSION N/A 09/26/2021  Procedure: TRANSESOPHAGEAL ECHOCARDIOGRAM (TEE);  Surgeon: Lajuana Matte, MD;  Location: [place];  Service: Open Heart Surgery;  Laterality: N/A;   TRANSURETHRAL RESECTION OF PROSTATE N/A 07/04/2015   Procedure: TRANSURETHRAL RESECTION OF THE PROSTATE WITH GYRUS INSTRUMENTS;  Surgeon: Franchot Gallo, MD;  Location: [facility];  Service: Urology;  Laterality: N/A;    Current Medications: Current Meds  Medication Sig   clopidogrel (PLAVIX) 75 MG tablet Take 1 tablet (75 mg total) by mouth daily.   ezetimibe (ZETIA) 10 MG tablet Take 10 mg by mouth daily.   glipiZIDE  (GLUCOTROL) 5 MG tablet Take 5 mg by mouth every morning.   metoprolol succinate (TOPROL XL) 25 MG 24 hr tablet Take 1 tablet (25 mg total) by mouth daily.   rosuvastatin (CRESTOR) 20 MG tablet Take 1 tablet (20 mg total) by mouth daily.     Allergies:   Codeine and Lipitor [atorvastatin]   Social History   Socioeconomic History   Marital status: Married    Spouse name: Not on file   Number of children: 3   Years of education: Not on file   Highest education level: Not on file  Occupational History   Occupation: retired  Tobacco Use   Smoking status: Former    Years: 12.00    Types: Cigarettes    Quit date: 06/27/1964    Years since quitting: 58.1   Smokeless tobacco: Never  Vaping Use   Vaping Use: Never used  Substance and Sexual Activity   Alcohol use: No   Drug use: No   Sexual activity: Not on file  Other Topics Concern   Not on file  Social History Narrative   Not on file   Social Determinants of Health   Financial Resource Strain: Not on file  Food Insecurity: Not on file  Transportation Needs: Not on file  Physical Activity: Not on file  Stress: Not on file  Social Connections: Not on file     Family History: The patient's family history includes Dementia in his father; Heart attack in an other family member. There is no history of Colon cancer, Rectal cancer, or Stomach cancer.  ROS:   Please see the history of present illness.    No nausea, vomiting, medication side effects, muscle aching, or difficulty lying flat.  All other systems reviewed and are negative.  EKGs/Labs/Other Studies Reviewed:    The following studies were reviewed today: 2 D Doppler ECHOCARDIOGRAM 2022: IMPRESSIONS   1. Left ventricular ejection fraction, by estimation, is 60 to 65%. The  left ventricle has normal function. The left ventricle has no regional  wall motion abnormalities. There is mild left ventricular hypertrophy.  Left ventricular diastolic parameters  are  consistent with Grade I diastolic dysfunction (impaired relaxation).   2. Right ventricular systolic function is normal. The right ventricular  size is normal. There is normal pulmonary artery systolic pressure. The  estimated right ventricular systolic pressure is 32.9 mmHg.   3. The mitral valve is normal in structure. Trivial mitral valve  regurgitation.   4. The aortic valve was not well visualized. Aortic valve regurgitation  is not visualized. Aortic valve sclerosis/calcification is present,  without any evidence of aortic stenosis.   5. The inferior vena cava is normal in size with greater than 50%  respiratory variability, suggesting right atrial pressure of 3 mmHg.   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  QS pattern V1  and V2.  No acute changes noted.  When compared to prior tracing from January 2023, no changes noted.  Recent Labs: 09/27/2021: Magnesium 2.2 01/08/2022: ALT 13; BUN 10; Creatinine, Ser 0.93; Hemoglobin 15.7; Platelets 212; Potassium 4.3; Sodium 141  Recent Lipid Panel    Component Value Date/Time   CHOL 96 (L) 01/08/2022 0835   TRIG 96 01/08/2022 0835   HDL 42 01/08/2022 0835   CHOLHDL 2.3 01/08/2022 0835   CHOLHDL 4.3 09/21/2021 0353   VLDL 28 09/21/2021 0353   LDLCALC 36 01/08/2022 0835    Physical Exam:    VS:  BP 128/78   Pulse 83   Ht '5\' 9"'$  (1.753 m)   Wt 215 lb 6.4 oz (97.7 kg)   SpO2 97%   BMI 31.81 kg/m     Wt Readings from Last 3 Encounters:  07/23/22 215 lb 6.4 oz (97.7 kg)  01/23/22 215 lb (97.5 kg)  11/08/21 212 lb (96.2 kg)     GEN: Overweight. No acute distress HEENT: Normal NECK: No JVD. LYMPHATICS: No lymphadenopathy CARDIAC: No murmur. RRR no gallop, or edema. VASCULAR:  Normal Pulses. No bruits. RESPIRATORY:  Clear to auscultation without rales, wheezing or rhonchi  ABDOMEN: Soft, non-tender, non-distended, No pulsatile mass, MUSCULOSKELETAL: No deformity   SKIN: Warm and dry NEUROLOGIC:  Alert and oriented x 3 PSYCHIATRIC:  Normal affect   ASSESSMENT:    1. S/P CABG x 3   2. Essential hypertension   3. Hyperlipidemia LDL goal <70    PLAN:    In order of problems listed above:  Recent chest pain not felt to be cardiac.  Could have been musculoskeletal or GI.  Observation for now.  Secondary prevention discussed.  Continue clopidogrel monotherapy in lieu of aspirin.  Notify if any bleeding complications. Continue metoprolol succinate. Continue rosuvastatin and Zetia.  Needs lipid panel and he is certain that it will be done by Dr. Harrington Challenger in November.  Last LDL cholesterol was well controlled at 36 in March.  Overall education and awareness concerning secondary risk prevention was discussed in detail: LDL less than 70, hemoglobin A1c less than 7, blood pressure target less than 130/80 mmHg, >150 minutes of moderate aerobic activity per week, avoidance of smoking, weight control (via diet and exercise), and continued surveillance/management of/for obstructive sleep apnea.    Medication Adjustments/Labs and Tests Ordered: Current medicines are reviewed at length with the patient today.  Concerns regarding medicines are outlined above.  No orders of the defined types were placed in this encounter.  No orders of the defined types were placed in this encounter.   There are no Patient Instructions on file for this visit.   Signed, Sinclair Grooms, MD  07/23/2022 8:20 AM    [place]

## 2022-07-23 ENCOUNTER — Encounter: Payer: Self-pay | Admitting: Interventional Cardiology

## 2022-07-23 ENCOUNTER — Ambulatory Visit: Payer: Medicare Other | Attending: Interventional Cardiology | Admitting: Interventional Cardiology

## 2022-07-23 VITALS — BP 128/78 | HR 83 | Ht 69.0 in | Wt 215.4 lb

## 2022-07-23 DIAGNOSIS — I10 Essential (primary) hypertension: Secondary | ICD-10-CM | POA: Diagnosis not present

## 2022-07-23 DIAGNOSIS — Z951 Presence of aortocoronary bypass graft: Secondary | ICD-10-CM

## 2022-07-23 DIAGNOSIS — E785 Hyperlipidemia, unspecified: Secondary | ICD-10-CM | POA: Diagnosis not present

## 2022-07-23 NOTE — Patient Instructions (Signed)
Medication Instructions:  Your physician recommends that you continue on your current medications as directed. Please refer to the Current Medication list given to you today.  *If you need a refill on your cardiac medications before your next appointment, please call your pharmacy*  Lab Work: NONE  Testing/Procedures: NONE  Follow-Up: At [facility], you and your health needs are our priority.  As part of our continuing mission to provide you with exceptional heart care, we have created designated Provider Care Teams.  These Care Teams include your primary Cardiologist (physician) and Advanced Practice Providers (APPs -  Physician Assistants and Nurse Practitioners) who all work together to provide you with the care you need, when you need it.  Your next appointment:   1 year(s)  The format for your next appointment:   In Person  Provider:   Henry W Smith III, MD    Important Information About Sugar       

## 2022-08-14 DIAGNOSIS — Z7984 Long term (current) use of oral hypoglycemic drugs: Secondary | ICD-10-CM | POA: Diagnosis not present

## 2022-08-14 DIAGNOSIS — E119 Type 2 diabetes mellitus without complications: Secondary | ICD-10-CM | POA: Diagnosis not present

## 2022-08-27 DIAGNOSIS — E7800 Pure hypercholesterolemia, unspecified: Secondary | ICD-10-CM | POA: Diagnosis not present

## 2022-08-27 DIAGNOSIS — E1169 Type 2 diabetes mellitus with other specified complication: Secondary | ICD-10-CM | POA: Diagnosis not present

## 2022-09-07 ENCOUNTER — Other Ambulatory Visit: Payer: Self-pay | Admitting: Physician Assistant

## 2022-09-20 DIAGNOSIS — I2510 Atherosclerotic heart disease of native coronary artery without angina pectoris: Secondary | ICD-10-CM | POA: Diagnosis not present

## 2022-09-20 DIAGNOSIS — E7800 Pure hypercholesterolemia, unspecified: Secondary | ICD-10-CM | POA: Diagnosis not present

## 2022-09-20 DIAGNOSIS — E1169 Type 2 diabetes mellitus with other specified complication: Secondary | ICD-10-CM | POA: Diagnosis not present

## 2022-09-20 DIAGNOSIS — Z23 Encounter for immunization: Secondary | ICD-10-CM | POA: Diagnosis not present

## 2022-09-20 DIAGNOSIS — Z0000 Encounter for general adult medical examination without abnormal findings: Secondary | ICD-10-CM | POA: Diagnosis not present

## 2022-11-16 DIAGNOSIS — E119 Type 2 diabetes mellitus without complications: Secondary | ICD-10-CM | POA: Diagnosis not present

## 2022-11-16 DIAGNOSIS — Z79899 Other long term (current) drug therapy: Secondary | ICD-10-CM | POA: Diagnosis not present

## 2022-11-16 DIAGNOSIS — I2510 Atherosclerotic heart disease of native coronary artery without angina pectoris: Secondary | ICD-10-CM | POA: Diagnosis not present

## 2022-11-16 DIAGNOSIS — Z0000 Encounter for general adult medical examination without abnormal findings: Secondary | ICD-10-CM | POA: Diagnosis not present

## 2022-11-16 DIAGNOSIS — E7800 Pure hypercholesterolemia, unspecified: Secondary | ICD-10-CM | POA: Diagnosis not present

## 2022-11-19 DIAGNOSIS — K136 Irritative hyperplasia of oral mucosa: Secondary | ICD-10-CM | POA: Diagnosis not present

## 2022-11-19 DIAGNOSIS — K1321 Leukoplakia of oral mucosa, including tongue: Secondary | ICD-10-CM | POA: Diagnosis not present

## 2022-12-14 ENCOUNTER — Other Ambulatory Visit (HOSPITAL_BASED_OUTPATIENT_CLINIC_OR_DEPARTMENT_OTHER): Payer: Self-pay | Admitting: Nurse Practitioner

## 2023-02-28 ENCOUNTER — Other Ambulatory Visit: Payer: Self-pay

## 2023-02-28 MED ORDER — METOPROLOL SUCCINATE ER 25 MG PO TB24: 25.0000 mg | ORAL_TABLET | Freq: Every day | ORAL | 1 refills | Status: DC

## 2023-05-17 DIAGNOSIS — E1169 Type 2 diabetes mellitus with other specified complication: Secondary | ICD-10-CM | POA: Diagnosis not present

## 2023-05-22 DIAGNOSIS — E1165 Type 2 diabetes mellitus with hyperglycemia: Secondary | ICD-10-CM | POA: Diagnosis not present

## 2023-05-22 DIAGNOSIS — R634 Abnormal weight loss: Secondary | ICD-10-CM | POA: Diagnosis not present

## 2023-05-22 DIAGNOSIS — I2510 Atherosclerotic heart disease of native coronary artery without angina pectoris: Secondary | ICD-10-CM | POA: Diagnosis not present

## 2023-07-18 ENCOUNTER — Ambulatory Visit: Payer: Medicare Other | Attending: Cardiology | Admitting: Cardiology

## 2023-07-18 ENCOUNTER — Encounter: Payer: Self-pay | Admitting: Cardiology

## 2023-07-18 VITALS — BP 124/76 | HR 78 | Ht 69.0 in | Wt 198.2 lb

## 2023-07-18 DIAGNOSIS — I2510 Atherosclerotic heart disease of native coronary artery without angina pectoris: Secondary | ICD-10-CM | POA: Diagnosis not present

## 2023-07-18 DIAGNOSIS — I10 Essential (primary) hypertension: Secondary | ICD-10-CM

## 2023-07-18 DIAGNOSIS — Z951 Presence of aortocoronary bypass graft: Secondary | ICD-10-CM

## 2023-07-18 MED ORDER — ASPIRIN 81 MG PO TBEC: 81.0000 mg | DELAYED_RELEASE_TABLET | Freq: Every day | ORAL | 3 refills | Status: AC

## 2023-07-18 NOTE — Progress Notes (Signed)
Cardiology Office Note:  .   Date:  07/18/2023  ID:  MAJDI [name], DOB Mar 06, 1941, MRN [id number] PCP: William Floro, MD  [facility] Providers Cardiologist:  Lesleigh Noe, MD (Inactive)    History of Present Illness: .   William [name] is a 82 y.o. male Discussed with the use of AI scribe software   History of Present Illness   The patient, with a history of coronary artery disease, hypertension, diabetes, obesity, and hyperlipidemia, underwent a triple coronary artery bypass graft (CABG) in December 2022 for left main disease. The patient's post-operative course has been uneventful, with a preserved ejection fraction of 60-65% on a recent echocardiogram.  The patient recalls experiencing severe pain in both arms and under the shoulder blades, which prompted the call to 911 and subsequent hospital admission. Since the surgery, he reports occasional chest discomfort, particularly after physical exertion such as lifting or using a hammer. The patient also describes a sensation of 'needles and pins' in the chest, which a friend with a similar surgical history also experiences.  The patient's current medication regimen includes Plavix, Toprol 25 mg daily, and Crestor 20 mg daily. His LDL was 38 in November 2023, and kidney function was within normal limits with a creatinine of 1.0.  Despite his medical history, the patient remains active and engaged in physical activities, including home repairs. His blood pressure is well-controlled, and he denies experiencing any skipped heartbeats.         Studies Reviewed: Marland Kitchen   EKG Interpretation Date/Time:  Thursday July 18 2023 08:59:31 EDT Ventricular Rate:  73 PR Interval:  180 QRS Duration:  100 QT Interval:  392 QTC Calculation: 431 R Axis:   -11  Text Interpretation: Normal sinus rhythm Inferior infarct , age undetermined Anteroseptal infarct , age undetermined When compared with ECG of 27-Sep-2021 06:57,  Anteroseptal infarct is now Present ST no longer elevated in Inferior leads Confirmed by Donato Schultz ([id number]) on 07/18/2023 9:15:53 AM    LABS LDL: 38 (08/2022) Creatinine: 1.0 (08/2022) Hemoglobin A1c: 8.2 (08/2022)  DIAGNOSTIC Echocardiogram: Normal ejection fraction 60-65% (2022) Risk Assessment/Calculations:            Physical Exam:   VS:  BP 124/76   Pulse 78   Ht 5\' 9"  (1.753 m)   Wt 198 lb 3.2 oz (89.9 kg)   SpO2 95%   BMI 29.27 kg/m    Wt Readings from Last 3 Encounters:  07/18/23 198 lb 3.2 oz (89.9 kg)  07/23/22 215 lb 6.4 oz (97.7 kg)  01/23/22 215 lb (97.5 kg)    GEN: Well nourished, well developed in no acute distress NECK: No JVD; No carotid bruits CARDIAC: CABG, RRR, no murmurs, rubs, gallops RESPIRATORY:  Clear to auscultation without rales, wheezing or rhonchi  ABDOMEN: Soft, non-tender, non-distended EXTREMITIES:  No edema; No deformity   ASSESSMENT AND PLAN: .   Assessment and Plan    Coronary Artery Disease (CAD) status post Coronary Artery Bypass Graft (CABG) in 2022 No significant damage or decrease in ejection fraction. No current chest pain or shortness of breath. -Continue Crestor 20mg  daily for cholesterol management. -Switch from Plavix to daily baby aspirin.  Hypertension Well controlled, no current issues. -Discontinue Toprol as blood pressure is well managed and patient is beyond the critical period post-heart attack.  Diabetes Recent Hemoglobin A1c was elevated at 8.2 despite being on Jardiance. -Continue Jardiance and follow up with primary care physician (Dr. Tenny [name]) for further management.  Follow up 1 yr with Advanced Practice Provider (APP) for routine care.              Signed, Donato Schultz, MD

## 2023-07-18 NOTE — Patient Instructions (Signed)
Medication Instructions:  Please STOP taking plavix (clopidogrel).  Please STOP taking Toprol (metoprolol succinate).  Please START taking aspirin 81 mg every day. This medication is available over the counter.   *If you need a refill on your cardiac medications before your next appointment, please call your pharmacy*   Lab Work: None.   If you have labs (blood work) drawn today and your tests are completely normal, you will receive your results only by: MyChart Message (if you have MyChart) OR A paper copy in the mail If you have any lab test that is abnormal or we need to change your treatment, we will call you to review the results.   Testing/Procedures: None.   Follow-Up:  Your next appointment:   1 year(s)  Provider:    Tereso Newcomer, PA-C

## 2023-07-30 DIAGNOSIS — K1321 Leukoplakia of oral mucosa, including tongue: Secondary | ICD-10-CM | POA: Diagnosis not present

## 2023-09-02 ENCOUNTER — Other Ambulatory Visit: Payer: Self-pay | Admitting: Cardiology

## 2023-09-27 DIAGNOSIS — E119 Type 2 diabetes mellitus without complications: Secondary | ICD-10-CM | POA: Diagnosis not present

## 2023-09-27 DIAGNOSIS — Z7984 Long term (current) use of oral hypoglycemic drugs: Secondary | ICD-10-CM | POA: Diagnosis not present

## 2023-09-27 DIAGNOSIS — Z961 Presence of intraocular lens: Secondary | ICD-10-CM | POA: Diagnosis not present

## 2023-11-21 DIAGNOSIS — E7800 Pure hypercholesterolemia, unspecified: Secondary | ICD-10-CM | POA: Diagnosis not present

## 2023-11-21 DIAGNOSIS — E1169 Type 2 diabetes mellitus with other specified complication: Secondary | ICD-10-CM | POA: Diagnosis not present

## 2023-11-26 DIAGNOSIS — E1165 Type 2 diabetes mellitus with hyperglycemia: Secondary | ICD-10-CM | POA: Diagnosis not present

## 2023-11-26 DIAGNOSIS — Z79899 Other long term (current) drug therapy: Secondary | ICD-10-CM | POA: Diagnosis not present

## 2023-11-26 DIAGNOSIS — E7800 Pure hypercholesterolemia, unspecified: Secondary | ICD-10-CM | POA: Diagnosis not present

## 2023-11-26 DIAGNOSIS — E1142 Type 2 diabetes mellitus with diabetic polyneuropathy: Secondary | ICD-10-CM | POA: Diagnosis not present

## 2023-11-26 DIAGNOSIS — Z0000 Encounter for general adult medical examination without abnormal findings: Secondary | ICD-10-CM | POA: Diagnosis not present

## 2023-11-26 DIAGNOSIS — Z23 Encounter for immunization: Secondary | ICD-10-CM | POA: Diagnosis not present

## 2023-11-26 DIAGNOSIS — G629 Polyneuropathy, unspecified: Secondary | ICD-10-CM | POA: Diagnosis not present

## 2023-12-25 ENCOUNTER — Ambulatory Visit: Payer: Medicare Other | Admitting: Podiatry

## 2023-12-25 DIAGNOSIS — Q828 Other specified congenital malformations of skin: Secondary | ICD-10-CM

## 2023-12-25 NOTE — Progress Notes (Signed)
  Subjective:  Patient ID: William [name] [facility], male    DOB: [birth year],  MRN: [id number]  Chief Complaint  Patient presents with   Diabetes    Pt stated that he is a diabetic and he is having some discomfort in the ball of his feet he stated that it feels like he is walking on rocks     83 y.o. male presents with the above complaint.  Patient presents with right submetatarsal 1 porokeratotic lesion painful to touch is progressive and worse worse with ambulation worse with pressure patient would like for me debride and is not going to himself denies any other acute, hurts when going barefooted on hard surface.   Review of Systems: Negative except as noted in the HPI. Denies N/V/F/Ch.  Past Medical History:  Diagnosis Date   BPH (benign prostatic hyperplasia)    CAD (coronary artery disease)    Nonobstructive at cardiac catheterization 2017   Colon polyps    Full dentures    History of colon polyps    History of kidney stones    1970's   HTN (hypertension)    Hyperlipidemia    Lower urinary tract symptoms (LUTS)    Osteoarthritis    Sciatica    Wears glasses     Current Outpatient Medications:    aspirin EC 81 MG tablet, Take 1 tablet (81 mg total) by mouth daily. Swallow whole., Disp: 90 tablet, Rfl: 3   ezetimibe (ZETIA) 10 MG tablet, Take 10 mg by mouth daily., Disp: , Rfl:    glipiZIDE (GLUCOTROL) 5 MG tablet, Take 5 mg by mouth every morning. (Patient not taking: Reported on 07/18/2023), Disp: , Rfl:    JARDIANCE 10 MG TABS tablet, Take 10 mg by mouth daily., Disp: , Rfl:    rosuvastatin (CRESTOR) 20 MG tablet, TAKE 1 TABLET(20 MG) BY MOUTH DAILY, Disp: 90 tablet, Rfl: 0  Social History   Tobacco Use  Smoking Status Former   Current packs/day: 0.00   Types: Cigarettes   Start date: 06/27/1952   Quit date: 06/27/1964   Years since quitting: 59.5  Smokeless Tobacco Never    Allergies  Allergen Reactions   Codeine Nausea And Vomiting and Other (See Comments)    "deathly  sick"   Lipitor [Atorvastatin] Other (See Comments)    Joint pain   Objective:  There were no vitals filed for this visit. There is no height or weight on file to calculate BMI. Constitutional Well developed. Well nourished.  Vascular Dorsalis pedis pulses palpable bilaterally. Posterior tibial pulses palpable bilaterally. Capillary refill normal to all digits.  No cyanosis or clubbing noted. Pedal hair growth normal.  Neurologic Normal speech. Oriented to person, place, and time. Epicritic sensation to light touch grossly present bilaterally.  Dermatologic Right submet 1 porokeratotic lesion with central nucleated core.  Orthopedic: Normal joint ROM without pain or crepitus bilaterally. No visible deformities. No bony tenderness.   Radiographs: None Assessment:   1. Porokeratosis    Plan:  Patient was evaluated and treated and all questions answered.  Right submet 1 porokeratosis -All questions and concerns were discussed with the patient in extensive detail -Lesion was debride down to healthy dry tissue.  No complication noted no pinpoint bleeding noted. -Shoe gear modification discussed  No follow-ups on file.

## 2024-05-26 DIAGNOSIS — E1165 Type 2 diabetes mellitus with hyperglycemia: Secondary | ICD-10-CM | POA: Diagnosis not present

## 2024-05-26 DIAGNOSIS — G629 Polyneuropathy, unspecified: Secondary | ICD-10-CM | POA: Diagnosis not present

## 2024-07-14 ENCOUNTER — Ambulatory Visit: Admitting: Cardiology

## 2024-07-30 DIAGNOSIS — M79671 Pain in right foot: Secondary | ICD-10-CM | POA: Diagnosis not present

## 2024-07-30 DIAGNOSIS — S9001XA Contusion of right ankle, initial encounter: Secondary | ICD-10-CM | POA: Diagnosis not present

## 2024-07-30 DIAGNOSIS — M25571 Pain in right ankle and joints of right foot: Secondary | ICD-10-CM | POA: Diagnosis not present

## 2024-09-02 ENCOUNTER — Ambulatory Visit: Attending: Student in an Organized Health Care Education/Training Program | Admitting: Cardiology

## 2024-09-02 ENCOUNTER — Encounter: Payer: Self-pay | Admitting: Cardiology

## 2024-09-02 VITALS — BP 124/72 | HR 75 | Ht 69.0 in | Wt 220.0 lb

## 2024-09-02 DIAGNOSIS — I2510 Atherosclerotic heart disease of native coronary artery without angina pectoris: Secondary | ICD-10-CM

## 2024-09-02 DIAGNOSIS — E119 Type 2 diabetes mellitus without complications: Secondary | ICD-10-CM | POA: Diagnosis not present

## 2024-09-02 DIAGNOSIS — Z951 Presence of aortocoronary bypass graft: Secondary | ICD-10-CM | POA: Diagnosis not present

## 2024-09-02 DIAGNOSIS — I10 Essential (primary) hypertension: Secondary | ICD-10-CM | POA: Diagnosis not present

## 2024-09-02 NOTE — Progress Notes (Signed)
 Cardiology Office Note:  .   Date:  09/02/2024  ID:  William [name], DOB December 19, 1940, MRN [id number] PCP: [name], MD  [facility] Providers Cardiologist:  [name], MD    History of Present Illness: .   William [name] is a 83 y.o. male Discussed the use of AI scribe  History of Present Illness William [name] is an 83 year old male with coronary artery disease and diabetes who presents for follow-up.  He underwent coronary artery bypass grafting in February 2022 after experiencing severe pain in both arms under the shoulder blades, leading to a hospital admission and cardiac catheterization. He experiences occasional chest discomfort after physical exertion, such as lifting or using a hammer, and sometimes feels 'pins and needles' in his chest. He is currently taking low dose aspirin , Zetia  10 mg, and rosuvastatin  20 mg for hyperlipidemia. His ejection fraction was 65% on an echocardiogram in 2022.  He has a history of diabetes with a current hemoglobin A1c of 7.7. He is taking pioglitazone and repaglinide 2 mg twice a day for diabetes management. He also experiences neuropathy in his feet, described as 'uricacy'.  He mentions a history of vertigo, which initially resolved after therapy in 2004 but recurred following his open-heart surgery. He describes episodes where he feels like the 'whole ground was spinning', particularly when changing positions or looking up. He has tried exercises for vertigo but sometimes experiences severe dizziness during these exercises.  He reports a sensation of his legs feeling like they might buckle when reaching up or standing, particularly when changing positions or looking up. He has been remodeling his house and is cautious when using ladders due to these symptoms.      ROS:   Studies Reviewed: SABRA   EKG Interpretation Date/Time:  Wednesday September 02 2024 08:18:47 EST Ventricular Rate:  75 PR Interval:  168 QRS  Duration:  94 QT Interval:  398 QTC Calculation: 444 R Axis:   -30  Text Interpretation: Normal sinus rhythm Left axis deviation Septal infarct (cited on or before 18-Jul-2023) When compared with ECG of 18-Jul-2023 08:59, Criteria for Inferior infarct are no longer Present Questionable change in initial forces of Anterior leads Confirmed by Parchment Oneil ([id number]) on 09/02/2024 8:28:25 AM    Results LABS HbA1c: 7.7 LDL: 32 Cr: 0.93  DIAGNOSTIC Echocardiogram: Ejection fraction 65% (2022) Cardiac catheterization: Revealed need for bypass surgery EKG: No changes from last year Risk Assessment/Calculations:            Physical Exam:   VS:  BP 124/72   Pulse 75   Ht 5' 9 (1.753 m)   Wt 220 lb (99.8 kg)   BMI 32.49 kg/m    Wt Readings from Last 3 Encounters:  09/02/24 220 lb (99.8 kg)  07/18/23 198 lb 3.2 oz (89.9 kg)  07/23/22 215 lb 6.4 oz (97.7 kg)    GEN: Well nourished, well developed in no acute distress NECK: No JVD; No carotid bruits CARDIAC: RRR, no murmurs, no rubs, no gallops RESPIRATORY:  Clear to auscultation without rales, wheezing or rhonchi  ABDOMEN: Soft, non-tender, non-distended EXTREMITIES:  No edema; No deformity   ASSESSMENT AND PLAN: .    Assessment and Plan Assessment & Plan Coronary artery disease status post coronary artery bypass grafting Status post coronary artery bypass grafting in 2022. Occasional chest discomfort after physical exertion, such as lifting or using a hammer, with pins and needles sensation in the chest. Ejection fraction is 65%  on echocardiogram from 2022. No significant changes in EKG compared to last year. - Continue current medications including low dose aspirin .  Type 2 diabetes mellitus with diabetic neuropathy Diabetes managed with pioglitazone and repaglinide. Hemoglobin A1c is 7.7. Reports neuropathy in feet, likely related to diabetes. - Continue current diabetes medications: pioglitazone and  repaglinide.  Hypertension Well controlled. - Continue current antihypertensive regimen.  Hyperlipidemia Managed with Zetia  and rosuvastatin . LDL is 32, indicating good control. - Continue current lipid-lowering therapy: Zetia  and rosuvastatin .  Recurrent vertigo Episodes of vertigo, particularly when changing positions or looking up. Vertigo was severe from 1985 to 2004 and recurred post-open heart surgery. Previous physical therapy was beneficial, but recent attempts were less effective. Advised to be cautious with ladders due to risk of falls. - Discuss vertigo management with primary care physician or Dr. Medlin. - Consider referral to physical therapy for vertigo management. - Restrict use of ladders to prevent falls.         Dispo: 1 yr  Signed, [name], MD

## 2024-09-02 NOTE — Patient Instructions (Signed)
 Medication Instructions:  The current medical regimen is effective;  continue present plan and medications.  *If you need a refill on your cardiac medications before your next appointment, please call your pharmacy*  Follow-Up: At [facility], you and your health needs are our priority.  As part of our continuing mission to provide you with exceptional heart care, our providers are all part of one team.  This team includes your primary Cardiologist (physician) and Advanced Practice Providers or APPs (Physician Assistants and Nurse Practitioners) who all work together to provide you with the care you need, when you need it.  Your next appointment:   1 year(s)  Provider:   Dr Dorothye Gathers     We recommend signing up for the patient portal called "MyChart".  Sign up information is provided on this After Visit Summary.  MyChart is used to connect with patients for Virtual Visits (Telemedicine).  Patients are able to view lab/test results, encounter notes, upcoming appointments, etc.  Non-urgent messages can be sent to your provider as well.   To learn more about what you can do with MyChart, go to [email or web address].

## 2024-09-02 NOTE — Progress Notes (Deleted)
  Cardiology Office Note:  .   Date:  09/02/2024  ID:  William [name], DOB 05/25/1941, MRN [id number] PCP: [name], MD  [facility] Providers Cardiologist:  Victory LELON Claudene DOUGLAS, MD (Inactive) { Click to update primary MD,subspecialty MD or APP then REFRESH:1}   History of Present Illness: .   William [name] is a 83 y.o. male Discussed the use of AI scribe software for clinical note transcription with the patient, who gave verbal consent to proceed.  History of Present Illness       ROS: ***  Studies Reviewed: .        Results  Risk Assessment/Calculations:   {Does this patient have ATRIAL FIBRILLATION?[phone]}         Physical Exam:   VS:  BP 124/72   Pulse 75   Ht 5' 9 (1.753 m)   Wt 220 lb (99.8 kg)   BMI 32.49 kg/m    Wt Readings from Last 3 Encounters:  09/02/24 220 lb (99.8 kg)  07/18/23 198 lb 3.2 oz (89.9 kg)  07/23/22 215 lb 6.4 oz (97.7 kg)    GEN: Well nourished, well developed in no acute distress NECK: No JVD; No carotid bruits CARDIAC: ***RRR, no murmurs, no rubs, no gallops RESPIRATORY:  Clear to auscultation without rales, wheezing or rhonchi  ABDOMEN: Soft, non-tender, non-distended EXTREMITIES:  No edema; No deformity   ASSESSMENT AND PLAN: .    Assessment and Plan Assessment & Plan        {Are you ordering a CV Procedure (e.g. stress test, cath, DCCV, TEE, etc)?   Press F2        :[id number]}  Dispo: ***  Signed, [name], MD
